# Patient Record
Sex: Male | Born: 1940
Health system: Southern US, Community
[De-identification: ages and names within clinical notes are randomized; demographics above are authoritative.]

## PROBLEM LIST (undated history)

## (undated) DIAGNOSIS — I739 Peripheral vascular disease, unspecified: Secondary | ICD-10-CM

## (undated) DIAGNOSIS — I10 Essential (primary) hypertension: Secondary | ICD-10-CM

## (undated) DIAGNOSIS — I2510 Atherosclerotic heart disease of native coronary artery without angina pectoris: Secondary | ICD-10-CM

## (undated) DIAGNOSIS — E119 Type 2 diabetes mellitus without complications: Secondary | ICD-10-CM

## (undated) DIAGNOSIS — E7800 Pure hypercholesterolemia, unspecified: Secondary | ICD-10-CM

## (undated) DIAGNOSIS — E785 Hyperlipidemia, unspecified: Secondary | ICD-10-CM

## (undated) HISTORY — DX: Type 2 diabetes mellitus without complications: E11.9

## (undated) HISTORY — DX: Pure hypercholesterolemia, unspecified: E78.00

## (undated) HISTORY — DX: Peripheral vascular disease, unspecified: I73.9

## (undated) HISTORY — DX: Essential (primary) hypertension: I10

## (undated) HISTORY — DX: Hyperlipidemia, unspecified: E78.5

## (undated) HISTORY — DX: Atherosclerotic heart disease of native coronary artery without angina pectoris: I25.10

## (undated) HISTORY — PX: OTHER SURGICAL HISTORY: SHX169

## (undated) HISTORY — PX: EYE SURGERY: SHX253

---

## 2006-04-28 ENCOUNTER — Encounter: Admission: RE | Admit: 2006-04-28 | Discharge: 2006-04-28 | Payer: Self-pay | Admitting: Interventional Cardiology

## 2006-04-30 ENCOUNTER — Inpatient Hospital Stay (HOSPITAL_COMMUNITY): Admission: AD | Admit: 2006-04-30 | Discharge: 2006-05-05 | Payer: Self-pay | Admitting: Interventional Cardiology

## 2006-05-28 ENCOUNTER — Encounter (HOSPITAL_COMMUNITY): Admission: RE | Admit: 2006-05-28 | Discharge: 2006-08-26 | Payer: Self-pay | Admitting: Interventional Cardiology

## 2006-07-28 HISTORY — PX: CORONARY ARTERY BYPASS GRAFT: SHX141

## 2006-08-28 ENCOUNTER — Encounter (HOSPITAL_COMMUNITY): Admission: RE | Admit: 2006-08-28 | Discharge: 2006-11-26 | Payer: Self-pay | Admitting: Interventional Cardiology

## 2012-06-30 ENCOUNTER — Other Ambulatory Visit: Payer: Self-pay | Admitting: Internal Medicine

## 2012-06-30 DIAGNOSIS — Z136 Encounter for screening for cardiovascular disorders: Secondary | ICD-10-CM

## 2012-07-07 ENCOUNTER — Other Ambulatory Visit: Payer: Self-pay

## 2012-07-07 ENCOUNTER — Ambulatory Visit
Admission: RE | Admit: 2012-07-07 | Discharge: 2012-07-07 | Disposition: A | Discharge status: Home / Self Care | Payer: Managed Care, Other (non HMO) | Source: Ambulatory Visit | Attending: Internal Medicine | Admitting: Internal Medicine

## 2012-07-07 DIAGNOSIS — Z136 Encounter for screening for cardiovascular disorders: Secondary | ICD-10-CM

## 2012-09-20 ENCOUNTER — Encounter (INDEPENDENT_AMBULATORY_CARE_PROVIDER_SITE_OTHER): Payer: Managed Care, Other (non HMO) | Admitting: Ophthalmology

## 2012-09-20 DIAGNOSIS — H35059 Retinal neovascularization, unspecified, unspecified eye: Secondary | ICD-10-CM

## 2012-09-20 DIAGNOSIS — H3530 Unspecified macular degeneration: Secondary | ICD-10-CM

## 2012-09-20 DIAGNOSIS — H43819 Vitreous degeneration, unspecified eye: Secondary | ICD-10-CM

## 2012-09-20 DIAGNOSIS — H2510 Age-related nuclear cataract, unspecified eye: Secondary | ICD-10-CM

## 2012-10-04 ENCOUNTER — Ambulatory Visit (INDEPENDENT_AMBULATORY_CARE_PROVIDER_SITE_OTHER): Payer: Managed Care, Other (non HMO) | Admitting: Ophthalmology

## 2012-10-04 DIAGNOSIS — H35059 Retinal neovascularization, unspecified, unspecified eye: Secondary | ICD-10-CM

## 2012-11-17 ENCOUNTER — Encounter (INDEPENDENT_AMBULATORY_CARE_PROVIDER_SITE_OTHER): Payer: Managed Care, Other (non HMO) | Admitting: Ophthalmology

## 2012-11-17 DIAGNOSIS — H43819 Vitreous degeneration, unspecified eye: Secondary | ICD-10-CM

## 2012-11-17 DIAGNOSIS — H35059 Retinal neovascularization, unspecified, unspecified eye: Secondary | ICD-10-CM

## 2012-11-17 DIAGNOSIS — H3530 Unspecified macular degeneration: Secondary | ICD-10-CM

## 2013-02-16 ENCOUNTER — Encounter (INDEPENDENT_AMBULATORY_CARE_PROVIDER_SITE_OTHER): Payer: Managed Care, Other (non HMO) | Admitting: Ophthalmology

## 2013-02-16 DIAGNOSIS — H3530 Unspecified macular degeneration: Secondary | ICD-10-CM

## 2013-02-16 DIAGNOSIS — E11319 Type 2 diabetes mellitus with unspecified diabetic retinopathy without macular edema: Secondary | ICD-10-CM

## 2013-02-16 DIAGNOSIS — E1139 Type 2 diabetes mellitus with other diabetic ophthalmic complication: Secondary | ICD-10-CM

## 2013-02-16 DIAGNOSIS — H2510 Age-related nuclear cataract, unspecified eye: Secondary | ICD-10-CM

## 2013-02-16 DIAGNOSIS — H43819 Vitreous degeneration, unspecified eye: Secondary | ICD-10-CM

## 2013-04-18 ENCOUNTER — Other Ambulatory Visit: Payer: Self-pay | Admitting: Interventional Cardiology

## 2013-04-18 DIAGNOSIS — Z79899 Other long term (current) drug therapy: Secondary | ICD-10-CM

## 2013-04-18 DIAGNOSIS — E7800 Pure hypercholesterolemia, unspecified: Secondary | ICD-10-CM

## 2013-04-28 ENCOUNTER — Other Ambulatory Visit: Payer: Managed Care, Other (non HMO)

## 2013-04-29 ENCOUNTER — Other Ambulatory Visit (INDEPENDENT_AMBULATORY_CARE_PROVIDER_SITE_OTHER): Payer: Managed Care, Other (non HMO)

## 2013-04-29 DIAGNOSIS — E7800 Pure hypercholesterolemia, unspecified: Secondary | ICD-10-CM

## 2013-04-29 DIAGNOSIS — Z79899 Other long term (current) drug therapy: Secondary | ICD-10-CM

## 2013-04-29 LAB — LIPID PANEL
HDL: 48 mg/dL (ref >39.00)
LDL Cholesterol: 51 mg/dL (ref 0–99)
Total CHOL/HDL Ratio: 2
Triglycerides: 100 mg/dL (ref 0.0–149.0)

## 2013-05-02 ENCOUNTER — Telehealth: Payer: Self-pay

## 2013-05-02 NOTE — Telephone Encounter (Signed)
Pt made of aware of labs.pt verbalized understanding

## 2013-05-02 NOTE — Telephone Encounter (Signed)
Message copied by Jarvis Newcomer on Mon May 02, 2013  8:35 AM ------      Message from: Verdis Prime      Created: Sat Apr 30, 2013 11:05 AM       The lipid panel shows target values have been obtained. Great! Needs repeat Lipid and Alt.  in 1 year. ------

## 2013-05-23 ENCOUNTER — Telehealth: Payer: Self-pay | Admitting: Interventional Cardiology

## 2013-05-23 NOTE — Telephone Encounter (Signed)
New Problem  Pt called requesting documentation of lab results.   Pt is listing an E-mail address if it is possible to have them emailed.. Please call   [email or web address]

## 2013-05-23 NOTE — Telephone Encounter (Signed)
pt rqst we email lab results.adv pt i could not email but I will mail him a copy.pt verbalized undestanding.

## 2013-07-01 ENCOUNTER — Telehealth: Payer: Self-pay | Admitting: *Deleted

## 2013-07-01 MED ORDER — ATORVASTATIN CALCIUM 10 MG PO TABS: 10.0000 mg | ORAL_TABLET | Freq: Every day | ORAL | Status: DC

## 2013-07-01 NOTE — Telephone Encounter (Signed)
Patient requests a refill on atorvastatin to be sent to [facility]. He is completely out and would like a call when this is sent in.[phone] Thanks, MI

## 2013-07-01 NOTE — Telephone Encounter (Signed)
Done

## 2013-08-22 ENCOUNTER — Ambulatory Visit (INDEPENDENT_AMBULATORY_CARE_PROVIDER_SITE_OTHER): Payer: Managed Care, Other (non HMO) | Admitting: Ophthalmology

## 2013-08-22 DIAGNOSIS — H43819 Vitreous degeneration, unspecified eye: Secondary | ICD-10-CM

## 2013-08-22 DIAGNOSIS — E1139 Type 2 diabetes mellitus with other diabetic ophthalmic complication: Secondary | ICD-10-CM

## 2013-08-22 DIAGNOSIS — H2510 Age-related nuclear cataract, unspecified eye: Secondary | ICD-10-CM

## 2013-08-22 DIAGNOSIS — E11319 Type 2 diabetes mellitus with unspecified diabetic retinopathy without macular edema: Secondary | ICD-10-CM

## 2013-08-22 DIAGNOSIS — H3530 Unspecified macular degeneration: Secondary | ICD-10-CM

## 2013-08-22 DIAGNOSIS — E1165 Type 2 diabetes mellitus with hyperglycemia: Secondary | ICD-10-CM

## 2013-08-29 ENCOUNTER — Encounter: Payer: Self-pay | Admitting: *Deleted

## 2013-08-29 ENCOUNTER — Encounter: Payer: Self-pay | Admitting: Interventional Cardiology

## 2013-08-29 DIAGNOSIS — E785 Hyperlipidemia, unspecified: Secondary | ICD-10-CM | POA: Insufficient documentation

## 2013-08-29 DIAGNOSIS — I2510 Atherosclerotic heart disease of native coronary artery without angina pectoris: Secondary | ICD-10-CM | POA: Insufficient documentation

## 2013-08-29 DIAGNOSIS — I739 Peripheral vascular disease, unspecified: Secondary | ICD-10-CM | POA: Insufficient documentation

## 2013-08-29 DIAGNOSIS — E119 Type 2 diabetes mellitus without complications: Secondary | ICD-10-CM | POA: Insufficient documentation

## 2013-08-29 DIAGNOSIS — I10 Essential (primary) hypertension: Secondary | ICD-10-CM | POA: Insufficient documentation

## 2013-08-29 DIAGNOSIS — E7800 Pure hypercholesterolemia, unspecified: Secondary | ICD-10-CM | POA: Insufficient documentation

## 2014-01-04 ENCOUNTER — Encounter: Payer: Self-pay | Admitting: Interventional Cardiology

## 2014-02-06 ENCOUNTER — Telehealth: Payer: Self-pay | Admitting: Interventional Cardiology

## 2014-02-06 MED ORDER — METOPROLOL SUCCINATE ER 50 MG PO TB24: 50.0000 mg | ORAL_TABLET | Freq: Every day | ORAL | Status: DC

## 2014-02-06 NOTE — Telephone Encounter (Signed)
New message     Refill metoprolol----[facility]---pt has not been seen at the [address] office

## 2014-02-06 NOTE — Telephone Encounter (Signed)
Metoprolol refilled.

## 2014-02-08 ENCOUNTER — Ambulatory Visit: Payer: Managed Care, Other (non HMO) | Admitting: Interventional Cardiology

## 2014-02-17 ENCOUNTER — Other Ambulatory Visit: Payer: Self-pay

## 2014-02-17 MED ORDER — METOPROLOL SUCCINATE ER 50 MG PO TB24
50.0000 mg | ORAL_TABLET | Freq: Every day | ORAL | Status: DC
Start: 2014-02-17 — End: 2014-06-02

## 2014-02-19 ENCOUNTER — Other Ambulatory Visit: Payer: Self-pay

## 2014-02-19 MED ORDER — ATORVASTATIN CALCIUM 10 MG PO TABS: 10.0000 mg | ORAL_TABLET | Freq: Every day | ORAL | Status: DC

## 2014-02-20 ENCOUNTER — Other Ambulatory Visit: Payer: Self-pay

## 2014-02-23 ENCOUNTER — Encounter: Payer: Self-pay | Admitting: Interventional Cardiology

## 2014-02-23 ENCOUNTER — Ambulatory Visit (INDEPENDENT_AMBULATORY_CARE_PROVIDER_SITE_OTHER): Payer: Managed Care, Other (non HMO) | Admitting: Interventional Cardiology

## 2014-02-23 VITALS — BP 144/88 | HR 63 | Ht 68.0 in | Wt 184.8 lb

## 2014-02-23 DIAGNOSIS — I739 Peripheral vascular disease, unspecified: Secondary | ICD-10-CM

## 2014-02-23 DIAGNOSIS — I25810 Atherosclerosis of coronary artery bypass graft(s) without angina pectoris: Secondary | ICD-10-CM

## 2014-02-23 DIAGNOSIS — I10 Essential (primary) hypertension: Secondary | ICD-10-CM

## 2014-02-23 DIAGNOSIS — E785 Hyperlipidemia, unspecified: Secondary | ICD-10-CM

## 2014-02-23 NOTE — Patient Instructions (Signed)
Your physician recommends that you continue on your current medications as directed. Please refer to the Current Medication list given to you today.  Your physician wants you to follow-up in: 1 year with Dr.Smith You will receive a reminder letter in the mail two months in advance. If you don't receive a letter, please call our office to schedule the follow-up appointment.  

## 2014-02-23 NOTE — Progress Notes (Signed)
Patient ID: Micheal [name], male   DOB: Nov 09, 1940, 73 y.o.   MRN: [id number]    [address] [place], [place]  [address] Phone: [phone](336) [phone] Fax:  [phone]  Date:  02/23/2014   ID:  Micheal [name], DOB Nov 09, 1940, MRN [id number]  PCP:  No primary provider on file.   ASSESSMENT:  1. Coronary artery disease with bypass grafting in 2008 2. Hypertension, controlled 3. Hyperlipidemia 4. Claudication  PLAN:  1. Same therapy 2. Continue aerobic activity 3. Continue walking to help improve claudication 4. Continue to have [facility]   SUBJECTIVE: Micheal LuoJohn F Munday is a 73 y.o. male was doing well. Just and off the golf course. Micheal [name] 73 today. He has no chest discomfort, dyspnea, palpitations, or other cardiac complaints.   Wt Readings from Last 3 Encounters:  02/23/14 184 lb 12.8 oz (83.825 kg)     Past Medical History  Diagnosis Date  . CAD (coronary artery disease)     with Cabg 2008  . HTN (hypertension)   . Hyperlipidemia   . Diabetes   . Hypercholesteremia   . Claudication     Current Outpatient Prescriptions  Medication Sig Dispense Refill  . atorvastatin (LIPITOR) 10 MG tablet Take 1 tablet (10 mg total) by mouth daily.  90 tablet  2  . Coenzyme Q10 (CO Q-10) 200 MG CAPS Take 1 capsule by mouth daily.      . Cyanocobalamin (VITAMIN B 12 PO) Take 1,000 mg by mouth daily.      . Ferrous Sulfate (IRON) 28 MG TABS Take by mouth.      . metFORMIN (GLUCOPHAGE) 500 MG tablet Take 500 mg by mouth daily with breakfast.      . metoprolol succinate (TOPROL-XL) 50 MG 24 hr tablet Take 1 tablet (50 mg total) by mouth daily. Take with or immediately following a meal.  90 tablet  0  . Multiple Vitamins-Minerals (CENTRUM SILVER PO) Take 1 tablet by mouth daily.       No current facility-administered medications for this visit.    Allergies:   Allergies not on file  Social History:  The patient  reports that he has  quit smoking. He does not have any smokeless tobacco history on file.   ROS:  Please see the history of present illness.   He denies neurological symptoms. Is no peripheral edema orthopnea or PND.   All other systems reviewed and negative.   OBJECTIVE: VS:  BP 144/88  Pulse 63  Ht 5\' 8"  (1.727 m)  Wt 184 lb 12.8 oz (83.825 kg)  BMI 28.11 kg/m2 Well nourished, well developed, in no acute distress, appears his stated age HEENT: normal Neck: JVD flat. Carotid bruit  absent  Cardiac:  normal S1, S2; RRR; no murmur Lungs:  clear to auscultation bilaterally, no wheezing, rhonchi or rales Abd: soft, nontender, no hepatomegaly Ext: Edema absent. Pulses 2+ and symmetric Skin: warm and dry Neuro:  CNs 2-12 intact, no focal abnormalities noted  EKG:  Normal sinus rhythm with nonspecific T-wave abnormality. No change when compared to prior       Signed, Darci NeedleHenry W. B. Smith III, MD 02/23/2014 2:37 PM

## 2014-05-22 ENCOUNTER — Ambulatory Visit (INDEPENDENT_AMBULATORY_CARE_PROVIDER_SITE_OTHER): Payer: Managed Care, Other (non HMO) | Admitting: Ophthalmology

## 2014-05-22 DIAGNOSIS — H3531 Nonexudative age-related macular degeneration: Secondary | ICD-10-CM

## 2014-05-22 DIAGNOSIS — E11319 Type 2 diabetes mellitus with unspecified diabetic retinopathy without macular edema: Secondary | ICD-10-CM

## 2014-05-22 DIAGNOSIS — H43813 Vitreous degeneration, bilateral: Secondary | ICD-10-CM

## 2014-05-22 DIAGNOSIS — H2513 Age-related nuclear cataract, bilateral: Secondary | ICD-10-CM

## 2014-05-22 DIAGNOSIS — E11329 Type 2 diabetes mellitus with mild nonproliferative diabetic retinopathy without macular edema: Secondary | ICD-10-CM

## 2014-06-02 ENCOUNTER — Other Ambulatory Visit: Payer: Self-pay | Admitting: Interventional Cardiology

## 2014-06-02 MED ORDER — METOPROLOL SUCCINATE ER 50 MG PO TB24: 50.0000 mg | ORAL_TABLET | Freq: Every day | ORAL | Status: DC

## 2014-06-07 ENCOUNTER — Telehealth: Payer: Self-pay | Admitting: Interventional Cardiology

## 2014-06-07 DIAGNOSIS — E785 Hyperlipidemia, unspecified: Secondary | ICD-10-CM

## 2014-06-07 NOTE — Telephone Encounter (Signed)
Per recall pt needs appointment for lipids, alt. Last lipid profile Oct 2014. Pt made appointment with scheduler. Orders placed for lipids, alt, linked orders.

## 2014-06-07 NOTE — Telephone Encounter (Signed)
New problem   Pt has a recall in for labs, but no order.

## 2014-06-19 ENCOUNTER — Other Ambulatory Visit: Payer: Managed Care, Other (non HMO)

## 2014-06-19 ENCOUNTER — Other Ambulatory Visit (INDEPENDENT_AMBULATORY_CARE_PROVIDER_SITE_OTHER): Payer: Managed Care, Other (non HMO) | Admitting: *Deleted

## 2014-06-19 DIAGNOSIS — E785 Hyperlipidemia, unspecified: Secondary | ICD-10-CM

## 2014-06-19 LAB — ALT: ALT: 21 U/L (ref 0–53)

## 2014-06-19 LAB — LIPID PANEL
CHOLESTEROL: 118 mg/dL (ref 0–200)
HDL: 44.8 mg/dL (ref >39.00)
LDL CALC: 61 mg/dL (ref 0–99)
NonHDL: 73.2
TRIGLYCERIDES: 62 mg/dL (ref 0.0–149.0)
Total CHOL/HDL Ratio: 3
VLDL: 12.4 mg/dL (ref 0.0–40.0)

## 2014-06-20 ENCOUNTER — Telehealth: Payer: Self-pay

## 2014-06-20 DIAGNOSIS — E785 Hyperlipidemia, unspecified: Secondary | ICD-10-CM

## 2014-06-20 NOTE — Telephone Encounter (Signed)
Pt aware of lab results with verbal understanding..Lipids are excellent. Recheck 1 year

## 2014-06-20 NOTE — Telephone Encounter (Signed)
-----   Message from Henry W Smith III, MD sent at 06/19/2014  5:42 PM EST ----- Lipids are excellent. Recheck 1 year 

## 2014-06-20 NOTE — Telephone Encounter (Signed)
called to give pt lab results.lmtcb 

## 2014-06-20 NOTE — Telephone Encounter (Signed)
-----   Message from Lesleigh NoeHenry W Smith III, MD sent at 06/19/2014  5:42 PM EST ----- Lipids are excellent. Recheck 1 year

## 2014-08-25 ENCOUNTER — Other Ambulatory Visit: Payer: Self-pay | Admitting: Interventional Cardiology

## 2014-11-21 ENCOUNTER — Other Ambulatory Visit: Payer: Self-pay | Admitting: Interventional Cardiology

## 2014-11-27 ENCOUNTER — Ambulatory Visit (INDEPENDENT_AMBULATORY_CARE_PROVIDER_SITE_OTHER): Payer: Managed Care, Other (non HMO) | Admitting: Ophthalmology

## 2014-11-27 DIAGNOSIS — H43813 Vitreous degeneration, bilateral: Secondary | ICD-10-CM

## 2014-11-27 DIAGNOSIS — H2513 Age-related nuclear cataract, bilateral: Secondary | ICD-10-CM

## 2014-11-27 DIAGNOSIS — H3531 Nonexudative age-related macular degeneration: Secondary | ICD-10-CM | POA: Diagnosis not present

## 2014-12-14 DIAGNOSIS — H4011X1 Primary open-angle glaucoma, mild stage: Secondary | ICD-10-CM | POA: Diagnosis not present

## 2015-01-01 ENCOUNTER — Telehealth: Payer: Self-pay | Admitting: Interventional Cardiology

## 2015-01-01 NOTE — Telephone Encounter (Signed)
New Message        Pt wants to know if he needs to have fasting labs done at his appt on 04/12/15. Please call back and advise.

## 2015-01-03 NOTE — Telephone Encounter (Signed)
Returned pt call. Pt we have fasting lipid and alt prior to his o/v with Dr.Smith on 04/12/15. Lab appt scheduled on 04/12/15 for fasting labs

## 2015-01-08 ENCOUNTER — Encounter (INDEPENDENT_AMBULATORY_CARE_PROVIDER_SITE_OTHER): Payer: Managed Care, Other (non HMO) | Admitting: Ophthalmology

## 2015-01-08 DIAGNOSIS — H3531 Nonexudative age-related macular degeneration: Secondary | ICD-10-CM

## 2015-01-08 DIAGNOSIS — H43813 Vitreous degeneration, bilateral: Secondary | ICD-10-CM

## 2015-01-08 DIAGNOSIS — H2513 Age-related nuclear cataract, bilateral: Secondary | ICD-10-CM

## 2015-01-08 DIAGNOSIS — H318 Other specified disorders of choroid: Secondary | ICD-10-CM

## 2015-01-15 ENCOUNTER — Ambulatory Visit (INDEPENDENT_AMBULATORY_CARE_PROVIDER_SITE_OTHER): Payer: Managed Care, Other (non HMO) | Admitting: Ophthalmology

## 2015-01-15 DIAGNOSIS — H3531 Nonexudative age-related macular degeneration: Secondary | ICD-10-CM

## 2015-01-15 DIAGNOSIS — H318 Other specified disorders of choroid: Secondary | ICD-10-CM | POA: Diagnosis not present

## 2015-02-12 ENCOUNTER — Encounter (INDEPENDENT_AMBULATORY_CARE_PROVIDER_SITE_OTHER): Payer: Managed Care, Other (non HMO) | Admitting: Ophthalmology

## 2015-02-12 DIAGNOSIS — H318 Other specified disorders of choroid: Secondary | ICD-10-CM

## 2015-02-12 DIAGNOSIS — H3531 Nonexudative age-related macular degeneration: Secondary | ICD-10-CM | POA: Diagnosis not present

## 2015-04-03 ENCOUNTER — Other Ambulatory Visit: Payer: Self-pay | Admitting: Interventional Cardiology

## 2015-04-11 ENCOUNTER — Other Ambulatory Visit (INDEPENDENT_AMBULATORY_CARE_PROVIDER_SITE_OTHER): Payer: Managed Care, Other (non HMO) | Admitting: *Deleted

## 2015-04-11 DIAGNOSIS — E785 Hyperlipidemia, unspecified: Secondary | ICD-10-CM

## 2015-04-11 LAB — LIPID PANEL
CHOLESTEROL: 112 mg/dL (ref 0–200)
HDL: 47.1 mg/dL (ref >39.00)
LDL Cholesterol: 48 mg/dL (ref 0–99)
NONHDL: 65.27
Total CHOL/HDL Ratio: 2
Triglycerides: 86 mg/dL (ref 0.0–149.0)
VLDL: 17.2 mg/dL (ref 0.0–40.0)

## 2015-04-11 LAB — ALT: ALT: 17 U/L (ref 0–53)

## 2015-04-12 ENCOUNTER — Encounter: Payer: Self-pay | Admitting: Interventional Cardiology

## 2015-04-12 ENCOUNTER — Ambulatory Visit (INDEPENDENT_AMBULATORY_CARE_PROVIDER_SITE_OTHER): Payer: Managed Care, Other (non HMO) | Admitting: Interventional Cardiology

## 2015-04-12 VITALS — BP 140/80 | HR 59 | Ht 69.0 in | Wt 183.0 lb

## 2015-04-12 DIAGNOSIS — E785 Hyperlipidemia, unspecified: Secondary | ICD-10-CM

## 2015-04-12 DIAGNOSIS — I10 Essential (primary) hypertension: Secondary | ICD-10-CM | POA: Diagnosis not present

## 2015-04-12 DIAGNOSIS — I739 Peripheral vascular disease, unspecified: Secondary | ICD-10-CM | POA: Diagnosis not present

## 2015-04-12 DIAGNOSIS — I25810 Atherosclerosis of coronary artery bypass graft(s) without angina pectoris: Secondary | ICD-10-CM | POA: Diagnosis not present

## 2015-04-12 MED ORDER — ATORVASTATIN CALCIUM 10 MG PO TABS: 5.0000 mg | ORAL_TABLET | Freq: Every day | ORAL | Status: DC

## 2015-04-12 NOTE — Patient Instructions (Signed)
Medication Instructions:  Your physician recommends that you continue on your current medications as directed. Please refer to the Current Medication list given to you today.   Labwork: None ordered  Testing/Procedures: None ordered  Follow-Up: Your physician wants you to follow-up in: 1 year with Dr.Smith You will receive a reminder letter in the mail two months in advance. If you don't receive a letter, please call our office to schedule the follow-up appointment.   Any Other Special Instructions Will Be Listed Below (If Applicable).   

## 2015-04-12 NOTE — Progress Notes (Signed)
Cardiology Office Note   Date:  04/12/2015   ID:  Micheal [name], DOB 1940-11-05, MRN [id number]  PCP:  Tommy Rainwater, MD  Cardiologist:  Lesleigh Noe, MD   Chief Complaint  Patient presents with  . Coronary Artery Disease      History of Present Illness: Micheal [name] is a 74 y.o. male who presents for CAD, hyperlipidemia, hypertension, and exertional lower extremity discomfort. He states he has had a lower extremity Doppler study performed at the [facility] and was told that things were normal.  The patient has had increasing difficulty with exertional leg fatigue. He stopped taking Lipitor 10 mg daily, and the discomfort eventually went away. He has not by color 5 mg per day for the last 6 months in is doing well. He has no limitations related to leg fatigue or discomfort.  He denies chest discomfort. He has not had syncope. No medication side effects.    Past Medical History  Diagnosis Date  . CAD (coronary artery disease)     with Cabg 2008  . HTN (hypertension)   . Hyperlipidemia   . Diabetes   . Hypercholesteremia   . Claudication     Past Surgical History  Procedure Laterality Date  . Coronary bypass surgery      2008     Current Outpatient Prescriptions  Medication Sig Dispense Refill  . atorvastatin (LIPITOR) 10 MG tablet Take 0.5 tablets (5 mg total) by mouth daily.    . Coenzyme Q10 (CO Q-10) 200 MG CAPS Take 1 capsule by mouth daily.    . Cyanocobalamin (VITAMIN B 12 PO) Take 1,000 mg by mouth daily.    . Ferrous Sulfate (IRON) 28 MG TABS Take by mouth.    . metFORMIN (GLUCOPHAGE) 1000 MG tablet Take one (1) tablet (1,000 mg total) by mouth every morning and take half (1/2) tablet (500 mg total) by mouth every evening.    . metoprolol succinate (TOPROL-XL) 50 MG 24 hr tablet Take 1 tablet (50 mg total) by mouth daily. 30 tablet 0  . SIMBRINZA 1-0.2 % SUSP Place 1 drop into both eyes 2 (two) times daily.     No current  facility-administered medications for this visit.    Allergies:   Review of patient's allergies indicates no known allergies.    Social History:  The patient  reports that he has quit smoking. He has never used smokeless tobacco. He reports that he drinks about 0.6 oz of alcohol per week. He reports that he does not use illicit drugs.   Family History:  The patient's family history includes Healthy in his sister, sister, and sister; Heart attack in his maternal grandfather; Heart disease in his father.    ROS:  Please see the history of present illness.   Otherwise, review of systems are positive for leg fatigue and possible musculoskeletal syndrome from Lipitor..   All other systems are reviewed and negative.    PHYSICAL EXAM: VS:  BP 162/76 mmHg  Pulse 59  Ht  (1.753 m)  Wt 83.008 kg (183 lb)  BMI 27.01 kg/m2 , BMI Body mass index is 27.01 kg/(m^2). GEN: Well nourished, well developed, in no acute distress HEENT: normal Neck: no JVD, carotid bruits, or masses Cardiac: RRR.  There is no murmur, rub, or gallop. There is no edema. Respiratory:  clear to auscultation bilaterally, normal work of breathing. GI: soft, nontender, nondistended, + BS MS: no deformity or atrophy Skin: warm  and dry, no rash Neuro:  Strength and sensation are intact Psych: euthymic mood, full affect   EKG:  EKG is ordered today. The ekg reveals normal sinus rhythm/sinus bradycardia with nonspecific T-wave flattening. No change from EKG done one year ago.   Recent Labs: 04/11/2015: ALT 17    Lipid Panel    Component Value Date/Time   CHOL 112 04/11/2015 0801   TRIG 86.0 04/11/2015 0801   HDL 47.10 04/11/2015 0801   CHOLHDL 2 04/11/2015 0801   VLDL 17.2 04/11/2015 0801   LDLCALC 48 04/11/2015 0801      Wt Readings from Last 3 Encounters:  04/12/15 83.008 kg (183 lb)  02/23/14 83.825 kg (184 lb 12.8 oz)      Other studies Reviewed: Additional studies/ records that were reviewed today  include: None. We will review the results of his recent lipid profile..    ASSESSMENT AND PLAN:  1. Coronary artery disease involving coronary bypass graft of native heart without angina pectoris Asymptomatic  2. Essential hypertension Controlled  3. Hyperlipidemia Total cholesterol is less than 130. LDL is less than 70.  4. Claudication Still has intermittent difficulty. He has had this evaluated at the [facility] and was told that his Doppler studies were normal. He stopped taking statin therapy for a while and states that all the discomfort on exertion when away. He decreased the dose by 50% and says that he now has no difficulty. We will continue to monitor this.    Current medicines are reviewed at length with the patient today.  The patient has the following concerns regarding medicines: He is now taking Lipitor 5 mg per day not 10 mg per day.  The following changes/actions have been instituted:    Notify me if the discomfort in the legs increases with exertion  Consider doing on bilateral lower extremity arterial Doppler study rather than depending upon the [place] results by word of mouth  Low-salt diet  Weight loss  Monitor blood pressure at home  Call if clinical problems. Caution not to stop his statin therapy without notifying us.  Labs/ tests ordered today include:   Orders Placed This Encounter  Procedures  . EKG 12-Lead     Disposition:   FU with HS in 1 year  Signed, Lesleigh Noe, MD  04/12/2015 3:32 PM    [facility] [address] [place], [place], [place]  [address] Phone: [phone]; Fax: [phone]

## 2015-04-13 ENCOUNTER — Telehealth: Payer: Self-pay

## 2015-04-13 DIAGNOSIS — E785 Hyperlipidemia, unspecified: Secondary | ICD-10-CM

## 2015-04-13 NOTE — Telephone Encounter (Signed)
Pt aware of lab results with verbal understanding. 

## 2015-04-13 NOTE — Telephone Encounter (Signed)
-----   Message from Lyn Records, MD sent at 04/11/2015  4:41 PM EDT ----- Stable and at target. Recheck in 1 year,

## 2015-05-15 ENCOUNTER — Encounter (INDEPENDENT_AMBULATORY_CARE_PROVIDER_SITE_OTHER): Payer: Managed Care, Other (non HMO) | Admitting: Ophthalmology

## 2015-05-15 DIAGNOSIS — H353122 Nonexudative age-related macular degeneration, left eye, intermediate dry stage: Secondary | ICD-10-CM | POA: Diagnosis not present

## 2015-05-15 DIAGNOSIS — H43813 Vitreous degeneration, bilateral: Secondary | ICD-10-CM

## 2015-05-15 DIAGNOSIS — H318 Other specified disorders of choroid: Secondary | ICD-10-CM | POA: Diagnosis not present

## 2015-05-15 DIAGNOSIS — H353211 Exudative age-related macular degeneration, right eye, with active choroidal neovascularization: Secondary | ICD-10-CM | POA: Diagnosis not present

## 2015-05-17 ENCOUNTER — Other Ambulatory Visit: Payer: Self-pay

## 2015-05-17 MED ORDER — METOPROLOL SUCCINATE ER 50 MG PO TB24: 50.0000 mg | ORAL_TABLET | Freq: Every day | ORAL | Status: DC

## 2015-05-30 ENCOUNTER — Ambulatory Visit (INDEPENDENT_AMBULATORY_CARE_PROVIDER_SITE_OTHER): Payer: Managed Care, Other (non HMO) | Admitting: Ophthalmology

## 2015-07-02 ENCOUNTER — Other Ambulatory Visit: Payer: Self-pay | Admitting: Interventional Cardiology

## 2015-07-09 DIAGNOSIS — H2512 Age-related nuclear cataract, left eye: Secondary | ICD-10-CM | POA: Diagnosis not present

## 2015-09-04 ENCOUNTER — Other Ambulatory Visit: Payer: Self-pay | Admitting: Interventional Cardiology

## 2015-09-04 MED ORDER — METOPROLOL SUCCINATE ER 50 MG PO TB24: 50.0000 mg | ORAL_TABLET | Freq: Every day | ORAL | Status: DC

## 2015-09-04 MED ORDER — ATORVASTATIN CALCIUM 10 MG PO TABS: 5.0000 mg | ORAL_TABLET | Freq: Every day | ORAL | Status: DC

## 2015-11-13 ENCOUNTER — Ambulatory Visit (INDEPENDENT_AMBULATORY_CARE_PROVIDER_SITE_OTHER): Payer: Managed Care, Other (non HMO) | Admitting: Ophthalmology

## 2015-11-13 DIAGNOSIS — H318 Other specified disorders of choroid: Secondary | ICD-10-CM | POA: Diagnosis not present

## 2015-11-13 DIAGNOSIS — H353122 Nonexudative age-related macular degeneration, left eye, intermediate dry stage: Secondary | ICD-10-CM | POA: Diagnosis not present

## 2015-11-13 DIAGNOSIS — H43813 Vitreous degeneration, bilateral: Secondary | ICD-10-CM | POA: Diagnosis not present

## 2016-04-15 DIAGNOSIS — Z23 Encounter for immunization: Secondary | ICD-10-CM | POA: Diagnosis not present

## 2016-04-30 ENCOUNTER — Other Ambulatory Visit: Payer: Medicare Other | Admitting: *Deleted

## 2016-04-30 DIAGNOSIS — I10 Essential (primary) hypertension: Secondary | ICD-10-CM | POA: Diagnosis not present

## 2016-04-30 DIAGNOSIS — E7800 Pure hypercholesterolemia, unspecified: Secondary | ICD-10-CM | POA: Diagnosis not present

## 2016-04-30 DIAGNOSIS — I2510 Atherosclerotic heart disease of native coronary artery without angina pectoris: Secondary | ICD-10-CM

## 2016-04-30 LAB — ALT: ALT: 14 U/L (ref 9–46)

## 2016-04-30 LAB — LIPID PANEL
Cholesterol: 131 mg/dL (ref 125–200)
HDL: 38 mg/dL — ABNORMAL LOW (ref >=40)
LDL Cholesterol: 61 mg/dL (ref <130)
Total CHOL/HDL Ratio: 3.4 Ratio (ref <=5.0)
Triglycerides: 160 mg/dL — ABNORMAL HIGH (ref <150)
VLDL: 32 mg/dL — ABNORMAL HIGH (ref <30)

## 2016-04-30 NOTE — Addendum Note (Signed)
Addended by: Tonita PhoenixBOWDEN, ROBIN K on: 04/30/2016 09:28 AM   Modules accepted: Orders

## 2016-05-01 ENCOUNTER — Telehealth: Payer: Self-pay | Admitting: Interventional Cardiology

## 2016-05-01 NOTE — Telephone Encounter (Signed)
New message    Patient calling stating the nurse just called him - returning call back

## 2016-05-01 NOTE — Telephone Encounter (Signed)
Informed pt of lab results. Pt verbalized understanding. 

## 2016-05-14 ENCOUNTER — Ambulatory Visit (INDEPENDENT_AMBULATORY_CARE_PROVIDER_SITE_OTHER): Payer: Medicare Other | Admitting: Ophthalmology

## 2016-05-14 DIAGNOSIS — H43813 Vitreous degeneration, bilateral: Secondary | ICD-10-CM | POA: Diagnosis not present

## 2016-05-14 DIAGNOSIS — H353122 Nonexudative age-related macular degeneration, left eye, intermediate dry stage: Secondary | ICD-10-CM | POA: Diagnosis not present

## 2016-05-14 DIAGNOSIS — H318 Other specified disorders of choroid: Secondary | ICD-10-CM

## 2016-06-02 ENCOUNTER — Ambulatory Visit (INDEPENDENT_AMBULATORY_CARE_PROVIDER_SITE_OTHER): Payer: Medicare Other | Admitting: Nurse Practitioner

## 2016-06-02 ENCOUNTER — Encounter: Payer: Self-pay | Admitting: Nurse Practitioner

## 2016-06-02 VITALS — BP 138/72 | HR 58 | Ht 68.0 in | Wt 181.8 lb

## 2016-06-02 DIAGNOSIS — E7800 Pure hypercholesterolemia, unspecified: Secondary | ICD-10-CM | POA: Diagnosis not present

## 2016-06-02 DIAGNOSIS — I25810 Atherosclerosis of coronary artery bypass graft(s) without angina pectoris: Secondary | ICD-10-CM

## 2016-06-02 DIAGNOSIS — E088 Diabetes mellitus due to underlying condition with unspecified complications: Secondary | ICD-10-CM | POA: Diagnosis not present

## 2016-06-02 DIAGNOSIS — I10 Essential (primary) hypertension: Secondary | ICD-10-CM | POA: Diagnosis not present

## 2016-06-02 NOTE — Progress Notes (Signed)
CARDIOLOGY OFFICE NOTE  Date:  06/02/2016    Micheal [name] Date of Birth: [birth year] Medical Record #[id number]  PCP:  Lorenda Ishiharaupashree Varadarajan, MD  Cardiologist:  Kyra MangesGerhardt & Smith    Chief Complaint  Patient presents with  . Coronary Artery Disease    Follow up visit - seen for Dr. [name]    History of Present Illness: Micheal LuoJohn F Hommes is a 75 y.o. male who presents today for a follow up visit. Seen for Dr. [name].   He has a history of CAD with prior CABG back in 2008, DM, hyperlipidemia, hypertension, and exertional lower extremity discomfort. He states he has had a prior lower extremity Doppler [facility] and was told that things were normal.  Last seen back in September of 2016 by Dr. [name] - complaining of leg fatigue - had stopped his statin with improvement and was able to be restarted at a lower dose without side effects.  Comes in today. Here alone. He is doing well. No chest pain. Not short of breath. His legs are better since he has been on the lower dose of statin. Golfs 3 times a week. He is happy with how he is doing.   Past Medical History:  Diagnosis Date  . CAD (coronary artery disease)    with Cabg 2008  . Claudication (HCC)   . Diabetes (HCC)   . HTN (hypertension)   . Hypercholesteremia   . Hyperlipidemia     Past Surgical History:  Procedure Laterality Date  . Coronary bypass surgery     2008     Medications: Current Outpatient Prescriptions  Medication Sig Dispense Refill  . atorvastatin (LIPITOR) 10 MG tablet Take 0.5 tablets (5 mg total) by mouth daily. 45 tablet 3  . Coenzyme Q10 (CO Q-10) 200 MG CAPS Take 1 capsule by mouth daily.    . Cyanocobalamin (VITAMIN B 12 PO) Take 1,000 mg by mouth daily.    . Ferrous Sulfate (IRON) 28 MG TABS Take by mouth.    . losartan (COZAAR) 50 MG tablet Take 50 mg by mouth daily.  2  . metFORMIN (GLUCOPHAGE) 1000 MG tablet Take one (1) tablet (1,000 mg total) by mouth every  morning and take half (1/2) tablet (500 mg total) by mouth every evening.    . metoprolol succinate (TOPROL-XL) 50 MG 24 hr tablet Take 1 tablet (50 mg total) by mouth daily. 90 tablet 3  . SIMBRINZA 1-0.2 % SUSP Place 1 drop into both eyes 2 (two) times daily.     No current facility-administered medications for this visit.     Allergies: No Known Allergies  Social History: The patient  reports that he has quit smoking. He has never used smokeless tobacco. He reports that he drinks about 0.6 oz of alcohol per week . He reports that he does not use drugs.   Family History: The patient's family history includes Healthy in his sister, sister, and sister; Heart attack in his maternal grandfather; Heart disease in his father.   Review of Systems: Please see the history of present illness.   Otherwise, the review of systems is positive for none.   All other systems are reviewed and negative.   Physical Exam: VS:  BP 138/72   Pulse (!) 58   Ht 5\' 8"  (1.727 m)   Wt 181 lb 12.8 oz (82.5 kg)   BMI 27.64 kg/m  .  BMI Body mass index is 27.64 kg/m.  Wt Readings from Last 3 Encounters:  06/02/16 181 lb 12.8 oz (82.5 kg)  04/12/15 183 lb (83 kg)  02/23/14 184 lb 12.8 oz (83.8 kg)    General: Pleasant. Well developed, well nourished and in no acute distress.   HEENT: Normal.  Neck: Supple, no JVD, carotid bruits, or masses noted.  Cardiac: Regular rate and rhythm. No murmurs, rubs, or gallops. No edema.  Respiratory:  Lungs are clear to auscultation bilaterally with normal work of breathing.  GI: Soft and nontender.  MS: No deformity or atrophy. Gait and ROM intact.  Skin: Warm and dry. Color is normal.  Neuro:  Strength and sensation are intact and no gross focal deficits noted.  Psych: Alert, appropriate and with normal affect.   LABORATORY DATA:  EKG:  EKG is ordered today. This demonstrates sinus bradycardia with non specific ST/T wave changes - unchanged.  Lab Results    Component Value Date   CHOL 131 04/30/2016   TRIG 160 (H) 04/30/2016   HDL 38 (L) 04/30/2016   LDLCALC 61 04/30/2016   ALT 14 04/30/2016    BNP (last 3 results) No results for input(s): BNP in the last 8760 hours.  ProBNP (last 3 results) No results for input(s): PROBNP in the last 8760 hours.   Other Studies Reviewed Today:   Assessment/Plan:  1. Coronary artery disease with remote CABG - continues to do well. No symptoms. Continue with CV risk factor modification.   2. Essential hypertension - BP ok on current regimen. Needs BMET.   3. Hyperlipidemia - recent labs noted. He is able to tolerate the current dose of his statin.   Current medicines are reviewed with the patient today.  The patient does not have concerns regarding medicines other than what has been noted above.  The following changes have been made:  See above.  Labs/ tests ordered today include:    Orders Placed This Encounter  Procedures  . Basic metabolic panel  . Hemoglobin A1c  . EKG 12-Lead     Disposition:   FU with Dr. [name] in one year.   Patient is agreeable to this plan and will call if any problems develop in the interim.   Signed: Rosalio MacadamiaLori C. Roshan Salamon, RN, ANP-C 06/02/2016 3:09 PM  [facility] [address] [place], [place]  [address] Phone: [phone](336) [phone] Fax: [phone]) [phone]

## 2016-06-02 NOTE — Patient Instructions (Addendum)
We will be checking the following labs tomorrow - Fasting BMET and A1C   Medication Instructions:    Continue with your current medicines.     Testing/Procedures To Be Arranged:  N/A  Follow-Up:   See Dr. [name] in one year.     Other Special Instructions:   Keep up the good work!    If you need a refill on your cardiac medications before your next appointment, please call your pharmacy.   Call the [facility] office at [phone] if you have any questions, problems or concerns.

## 2016-06-03 ENCOUNTER — Other Ambulatory Visit (INDEPENDENT_AMBULATORY_CARE_PROVIDER_SITE_OTHER): Payer: Medicare Other | Admitting: *Deleted

## 2016-06-03 DIAGNOSIS — E7800 Pure hypercholesterolemia, unspecified: Secondary | ICD-10-CM

## 2016-06-03 DIAGNOSIS — E088 Diabetes mellitus due to underlying condition with unspecified complications: Secondary | ICD-10-CM | POA: Diagnosis not present

## 2016-06-03 DIAGNOSIS — I10 Essential (primary) hypertension: Secondary | ICD-10-CM

## 2016-06-03 DIAGNOSIS — I25810 Atherosclerosis of coronary artery bypass graft(s) without angina pectoris: Secondary | ICD-10-CM

## 2016-06-03 LAB — BASIC METABOLIC PANEL
BUN: 16 mg/dL (ref 7–25)
CO2: 28 mmol/L (ref 20–31)
Calcium: 9.4 mg/dL (ref 8.6–10.3)
Chloride: 104 mmol/L (ref 98–110)
Creat: 0.97 mg/dL (ref 0.70–1.18)
Glucose, Bld: 155 mg/dL — ABNORMAL HIGH (ref 65–99)
Potassium: 5.4 mmol/L — ABNORMAL HIGH (ref 3.5–5.3)
Sodium: 140 mmol/L (ref 135–146)

## 2016-06-04 LAB — HEMOGLOBIN A1C
Hgb A1c MFr Bld: 7.1 % — ABNORMAL HIGH (ref <5.7)
Mean Plasma Glucose: 157 mg/dL

## 2016-07-15 DIAGNOSIS — E119 Type 2 diabetes mellitus without complications: Secondary | ICD-10-CM | POA: Diagnosis not present

## 2016-07-15 DIAGNOSIS — I10 Essential (primary) hypertension: Secondary | ICD-10-CM | POA: Diagnosis not present

## 2016-07-15 DIAGNOSIS — Z0000 Encounter for general adult medical examination without abnormal findings: Secondary | ICD-10-CM | POA: Diagnosis not present

## 2016-07-15 DIAGNOSIS — Z1389 Encounter for screening for other disorder: Secondary | ICD-10-CM | POA: Diagnosis not present

## 2016-07-15 DIAGNOSIS — E785 Hyperlipidemia, unspecified: Secondary | ICD-10-CM | POA: Diagnosis not present

## 2016-07-15 DIAGNOSIS — Z125 Encounter for screening for malignant neoplasm of prostate: Secondary | ICD-10-CM | POA: Diagnosis not present

## 2016-07-15 DIAGNOSIS — Z7984 Long term (current) use of oral hypoglycemic drugs: Secondary | ICD-10-CM | POA: Diagnosis not present

## 2016-08-14 ENCOUNTER — Encounter (INDEPENDENT_AMBULATORY_CARE_PROVIDER_SITE_OTHER): Payer: Medicare Other | Admitting: Ophthalmology

## 2016-08-29 ENCOUNTER — Encounter (INDEPENDENT_AMBULATORY_CARE_PROVIDER_SITE_OTHER): Payer: Medicare Other | Admitting: Ophthalmology

## 2016-08-29 DIAGNOSIS — H26493 Other secondary cataract, bilateral: Secondary | ICD-10-CM

## 2016-08-29 DIAGNOSIS — H353121 Nonexudative age-related macular degeneration, left eye, early dry stage: Secondary | ICD-10-CM

## 2016-08-29 DIAGNOSIS — H318 Other specified disorders of choroid: Secondary | ICD-10-CM

## 2016-08-29 DIAGNOSIS — H43813 Vitreous degeneration, bilateral: Secondary | ICD-10-CM | POA: Diagnosis not present

## 2016-09-26 ENCOUNTER — Other Ambulatory Visit (INDEPENDENT_AMBULATORY_CARE_PROVIDER_SITE_OTHER): Payer: Medicare Other | Admitting: Ophthalmology

## 2016-09-26 DIAGNOSIS — H2701 Aphakia, right eye: Secondary | ICD-10-CM

## 2016-11-18 ENCOUNTER — Other Ambulatory Visit: Payer: Self-pay | Admitting: Interventional Cardiology

## 2016-12-25 DIAGNOSIS — E119 Type 2 diabetes mellitus without complications: Secondary | ICD-10-CM | POA: Diagnosis not present

## 2017-01-08 ENCOUNTER — Encounter (INDEPENDENT_AMBULATORY_CARE_PROVIDER_SITE_OTHER): Payer: Medicare Other | Admitting: Ophthalmology

## 2017-01-08 DIAGNOSIS — H353121 Nonexudative age-related macular degeneration, left eye, early dry stage: Secondary | ICD-10-CM

## 2017-01-08 DIAGNOSIS — H353211 Exudative age-related macular degeneration, right eye, with active choroidal neovascularization: Secondary | ICD-10-CM

## 2017-01-08 DIAGNOSIS — H43813 Vitreous degeneration, bilateral: Secondary | ICD-10-CM

## 2017-01-14 DIAGNOSIS — E119 Type 2 diabetes mellitus without complications: Secondary | ICD-10-CM | POA: Diagnosis not present

## 2017-01-14 DIAGNOSIS — Z7984 Long term (current) use of oral hypoglycemic drugs: Secondary | ICD-10-CM | POA: Diagnosis not present

## 2017-01-14 DIAGNOSIS — I10 Essential (primary) hypertension: Secondary | ICD-10-CM | POA: Diagnosis not present

## 2017-01-14 DIAGNOSIS — E1165 Type 2 diabetes mellitus with hyperglycemia: Secondary | ICD-10-CM | POA: Diagnosis not present

## 2017-01-14 DIAGNOSIS — E785 Hyperlipidemia, unspecified: Secondary | ICD-10-CM | POA: Diagnosis not present

## 2017-01-14 DIAGNOSIS — R252 Cramp and spasm: Secondary | ICD-10-CM | POA: Diagnosis not present

## 2017-01-15 ENCOUNTER — Encounter (INDEPENDENT_AMBULATORY_CARE_PROVIDER_SITE_OTHER): Payer: Medicare Other | Admitting: Ophthalmology

## 2017-01-15 DIAGNOSIS — H318 Other specified disorders of choroid: Secondary | ICD-10-CM

## 2017-01-15 DIAGNOSIS — H353121 Nonexudative age-related macular degeneration, left eye, early dry stage: Secondary | ICD-10-CM | POA: Diagnosis not present

## 2017-01-30 DIAGNOSIS — H401131 Primary open-angle glaucoma, bilateral, mild stage: Secondary | ICD-10-CM | POA: Diagnosis not present

## 2017-02-12 ENCOUNTER — Encounter (INDEPENDENT_AMBULATORY_CARE_PROVIDER_SITE_OTHER): Payer: Medicare Other | Admitting: Ophthalmology

## 2017-03-11 ENCOUNTER — Encounter (INDEPENDENT_AMBULATORY_CARE_PROVIDER_SITE_OTHER): Payer: Medicare Other | Admitting: Ophthalmology

## 2017-03-20 ENCOUNTER — Encounter (INDEPENDENT_AMBULATORY_CARE_PROVIDER_SITE_OTHER): Payer: Medicare Other | Admitting: Ophthalmology

## 2017-03-20 DIAGNOSIS — H353121 Nonexudative age-related macular degeneration, left eye, early dry stage: Secondary | ICD-10-CM

## 2017-03-20 DIAGNOSIS — H318 Other specified disorders of choroid: Secondary | ICD-10-CM

## 2017-05-08 ENCOUNTER — Other Ambulatory Visit: Payer: Self-pay | Admitting: Interventional Cardiology

## 2017-05-10 DIAGNOSIS — Z23 Encounter for immunization: Secondary | ICD-10-CM | POA: Diagnosis not present

## 2017-06-02 ENCOUNTER — Other Ambulatory Visit: Payer: Self-pay

## 2017-06-02 MED ORDER — METOPROLOL SUCCINATE ER 50 MG PO TB24: 50.0000 mg | ORAL_TABLET | Freq: Every day | ORAL | 1 refills | Status: DC

## 2017-06-02 MED ORDER — METOPROLOL SUCCINATE ER 50 MG PO TB24: 50.0000 mg | ORAL_TABLET | Freq: Every day | ORAL | 0 refills | Status: DC

## 2017-06-02 NOTE — Addendum Note (Signed)
Addended by: Valrie HartISLEY, Abigaelle Verley L on: 06/02/2017 03:31 PM   Modules accepted: Orders

## 2017-06-02 NOTE — Telephone Encounter (Signed)
Received a fax from [facility] requesting to change the metoprolol rx that was sent in today to a ninety day supply. I will update and resend.

## 2017-06-05 DIAGNOSIS — Z23 Encounter for immunization: Secondary | ICD-10-CM | POA: Diagnosis not present

## 2017-06-22 ENCOUNTER — Encounter (INDEPENDENT_AMBULATORY_CARE_PROVIDER_SITE_OTHER): Payer: Medicare Other | Admitting: Ophthalmology

## 2017-06-22 DIAGNOSIS — H353122 Nonexudative age-related macular degeneration, left eye, intermediate dry stage: Secondary | ICD-10-CM

## 2017-06-22 DIAGNOSIS — H43813 Vitreous degeneration, bilateral: Secondary | ICD-10-CM | POA: Diagnosis not present

## 2017-06-22 DIAGNOSIS — H353211 Exudative age-related macular degeneration, right eye, with active choroidal neovascularization: Secondary | ICD-10-CM | POA: Diagnosis not present

## 2017-07-07 NOTE — Progress Notes (Signed)
Cardiology Office Note    Date:  07/08/2017   ID:  Micheal [name], DOB 02/13/1941, MRN [id number]  PCP:  Micheal [name], Rupashree, MD  Cardiologist: Micheal NoeHenry W Smith III, MD   Chief Complaint  Patient presents with  . Coronary Artery Disease    History of Present Illness:  Micheal [name] is a 76 y.o. male has a history of CAD with prior CABG back in 2008, DM, hyperlipidemia, hypertension, and exertional lower extremity .  He cut his statin dose and have and now feels that his legs are stronger and he has more exertional tolerance.  He denies chest pain, dyspnea, palpitations, syncope, and edema.  There is no orthopnea.  He is quite pleased with his overall cardiovascular condition.  States that he had a peripheral arterial evaluation done at the [facility] and was told he did not have PAD.  This study was done sometime in the last 3 years.   Past Medical History:  Diagnosis Date  . CAD (coronary artery disease)    with Cabg 2008  . Claudication (HCC)   . Diabetes (HCC)   . HTN (hypertension)   . Hypercholesteremia   . Hyperlipidemia     Past Surgical History:  Procedure Laterality Date  . Coronary bypass surgery     2008    Current Medications: Outpatient Medications Prior to Visit  Medication Sig Dispense Refill  . atorvastatin (LIPITOR) 10 MG tablet Take 0.5 tablets (5 mg total) by mouth daily. 45 tablet 3  . Coenzyme Q10 (CO Q-10) 200 MG CAPS Take 1 capsule by mouth daily.    . Cyanocobalamin (VITAMIN B 12 PO) Take 1,000 mg by mouth daily.    . Ferrous Sulfate (IRON) 28 MG TABS Take by mouth.    . losartan (COZAAR) 50 MG tablet Take 50 mg by mouth daily.  2  . metFORMIN (GLUCOPHAGE) 1000 MG tablet Take half (1/2) tablet (500 mg total) by mouth every morning and take one (1) tablet (1,000 mg total) by mouth every evening.    . metoprolol succinate (TOPROL-XL) 50 MG 24 hr tablet Take 1 tablet (50 mg total) daily by mouth. 90 tablet 0  . SIMBRINZA 1-0.2 % SUSP Place 1 drop  into both eyes 2 (two) times daily.     No facility-administered medications prior to visit.      Allergies:   Patient has no known allergies.   Social History   Socioeconomic History  . Marital status: Married    Spouse name: None  . Number of children: None  . Years of education: None  . Highest education level: None  Social Needs  . Financial resource strain: None  . Food insecurity - worry: None  . Food insecurity - inability: None  . Transportation needs - medical: None  . Transportation needs - non-medical: None  Occupational History  . None  Tobacco Use  . Smoking status: Former Games developermoker  . Smokeless tobacco: Never Used  Substance and Sexual Activity  . Alcohol use: Yes    Alcohol/week: 0.6 oz    Types: 1 Glasses of wine per week  . Drug use: No  . Sexual activity: Yes  Other Topics Concern  . None  Social History Narrative  . None     Family History:  The patient's family history includes Healthy in his sister, sister, and sister; Heart attack in his maternal grandfather; Heart disease in his father.   ROS:   Please see the history of present illness.  Leg weakness and fatigue. All other systems reviewed and are negative.   PHYSICAL EXAM:   VS:  BP (!) 162/68   Pulse (!) 55   Ht 5\' 9"  (1.753 m)   Wt 179 lb (81.2 kg)   BMI 26.43 kg/m    Repeat blood pressure 140/70 mmHg GEN: Well nourished, well developed, in no acute distress  HEENT: normal  Neck: no JVD, carotid bruits, or masses Cardiac: RRR; no murmurs, rubs, or gallops,no edema  Respiratory:  clear to auscultation bilaterally, normal work of breathing GI: soft, nontender, nondistended, + BS MS: no deformity or atrophy  Skin: warm and dry, no rash Neuro:  Alert and Oriented x 3, Strength and sensation are intact Psych: euthymic mood, full affect  Wt Readings from Last 3 Encounters:  07/08/17 179 lb (81.2 kg)  06/02/16 181 lb 12.8 oz (82.5 kg)  04/12/15 183 lb (83 kg)      Studies/Labs  Reviewed:   EKG:  EKG sinus bradycardia, 55 bpm.  Nonspecific T wave flattening.  Recent Labs: No results found for requested labs within last 8760 hours.   Lipid Panel    Component Value Date/Time   CHOL 131 04/30/2016 0928   TRIG 160 (H) 04/30/2016 0928   HDL 38 (L) 04/30/2016 0928   CHOLHDL 3.4 04/30/2016 0928   VLDL 32 (H) 04/30/2016 0928   LDLCALC 61 04/30/2016 0928    Additional studies/ records that were reviewed today include:  No functional studies    ASSESSMENT:    1. Coronary artery disease involving native coronary artery of native heart without angina pectoris   2. Essential hypertension   3. Type 2 diabetes mellitus with complication, without long-term current use of insulin (HCC)   4. Pure hypercholesterolemia   5. Claudication [facility](HCC)      PLAN:  In order of problems listed above:  1. Asymptomatic and quite physically active.  No functional studies are recommended at this time. 2. Systolic blood pressure is but not at target for a diabetic.  Goal is 130/85 mmHg or less.  Decrease salt in diet.  Consider adding HCTZ 12.5 mg to his current regimen.  If this is done he could easily be switched to Hyzaar 50/12.5 mg. 3. States his hemoglobin A1c was near 7 when performed recently. 4. On very low-dose atorvastatin.  He independently decrease his dose from 10 to 5 mg/day and noticed a commensurate improvement in lower extremity strength.  I have recommended that he discontinue the medication for 4-6 weeks, and objectively report to us on lower extremity strength off statin therapy.  Depending upon his report, he may attempt switching to another statin or a PCS-K 9. 5. Has full lower extremity arterial pulses.  By word of mouth he states that prior lower extremity ABI/Doppler study was normal.  Overall doing well.  No symptoms from cardiac standpoint.  Risk factor modification including A1c less than 7, blood pressure target is noted above.  LDL cholesterol less than  or equal to 70.    Medication Adjustments/Labs and Tests Ordered: Current medicines are reviewed at length with the patient today.  Concerns regarding medicines are outlined above.  Medication changes, Labs and Tests ordered today are listed in the Patient Instructions below. There are no Patient Instructions on file for this visit.   Signed, Micheal NoeHenry W Smith III, MD  07/08/2017 4:31 PM    [facility] [address], [place], [place]  [id number] Phone: [phone]; Fax: [phone]

## 2017-07-08 ENCOUNTER — Encounter: Payer: Self-pay | Admitting: Interventional Cardiology

## 2017-07-08 ENCOUNTER — Ambulatory Visit (INDEPENDENT_AMBULATORY_CARE_PROVIDER_SITE_OTHER): Payer: Medicare Other | Admitting: Interventional Cardiology

## 2017-07-08 VITALS — BP 162/68 | HR 55 | Ht 69.0 in | Wt 179.0 lb

## 2017-07-08 DIAGNOSIS — E118 Type 2 diabetes mellitus with unspecified complications: Secondary | ICD-10-CM | POA: Diagnosis not present

## 2017-07-08 DIAGNOSIS — E7800 Pure hypercholesterolemia, unspecified: Secondary | ICD-10-CM

## 2017-07-08 DIAGNOSIS — I739 Peripheral vascular disease, unspecified: Secondary | ICD-10-CM

## 2017-07-08 DIAGNOSIS — I10 Essential (primary) hypertension: Secondary | ICD-10-CM

## 2017-07-08 DIAGNOSIS — I2510 Atherosclerotic heart disease of native coronary artery without angina pectoris: Secondary | ICD-10-CM

## 2017-07-08 NOTE — Patient Instructions (Signed)
Medication Instructions:  1) DISCONTINUE Atorvastatin.  Please call the office in a month and let us know if leg weakness and fatigue resolve after stopping this medication.  Labwork: None  Testing/Procedures: None  Follow-Up: Your physician wants you to follow-up in: 1 year with Dr. [name].  You will receive a reminder letter in the mail two months in advance. If you don't receive a letter, please call our office to schedule the follow-up appointment.   Any Other Special Instructions Will Be Listed Below (If Applicable).     If you need a refill on your cardiac medications before your next appointment, please call your pharmacy.

## 2017-07-17 ENCOUNTER — Encounter (INDEPENDENT_AMBULATORY_CARE_PROVIDER_SITE_OTHER): Payer: Medicare Other | Admitting: Ophthalmology

## 2017-07-17 DIAGNOSIS — H353122 Nonexudative age-related macular degeneration, left eye, intermediate dry stage: Secondary | ICD-10-CM | POA: Diagnosis not present

## 2017-07-17 DIAGNOSIS — H353211 Exudative age-related macular degeneration, right eye, with active choroidal neovascularization: Secondary | ICD-10-CM | POA: Diagnosis not present

## 2017-07-17 DIAGNOSIS — H43813 Vitreous degeneration, bilateral: Secondary | ICD-10-CM | POA: Diagnosis not present

## 2017-08-11 DIAGNOSIS — E119 Type 2 diabetes mellitus without complications: Secondary | ICD-10-CM | POA: Diagnosis not present

## 2017-08-11 DIAGNOSIS — Z6826 Body mass index (BMI) 26.0-26.9, adult: Secondary | ICD-10-CM | POA: Diagnosis not present

## 2017-08-11 DIAGNOSIS — Z1389 Encounter for screening for other disorder: Secondary | ICD-10-CM | POA: Diagnosis not present

## 2017-08-11 DIAGNOSIS — Z0000 Encounter for general adult medical examination without abnormal findings: Secondary | ICD-10-CM | POA: Diagnosis not present

## 2017-08-11 DIAGNOSIS — I10 Essential (primary) hypertension: Secondary | ICD-10-CM | POA: Diagnosis not present

## 2017-08-11 DIAGNOSIS — E785 Hyperlipidemia, unspecified: Secondary | ICD-10-CM | POA: Diagnosis not present

## 2017-08-12 DIAGNOSIS — Z125 Encounter for screening for malignant neoplasm of prostate: Secondary | ICD-10-CM | POA: Diagnosis not present

## 2017-08-14 ENCOUNTER — Encounter (INDEPENDENT_AMBULATORY_CARE_PROVIDER_SITE_OTHER): Payer: Medicare Other | Admitting: Ophthalmology

## 2017-08-14 DIAGNOSIS — H43813 Vitreous degeneration, bilateral: Secondary | ICD-10-CM

## 2017-08-14 DIAGNOSIS — H353122 Nonexudative age-related macular degeneration, left eye, intermediate dry stage: Secondary | ICD-10-CM | POA: Diagnosis not present

## 2017-08-14 DIAGNOSIS — H353211 Exudative age-related macular degeneration, right eye, with active choroidal neovascularization: Secondary | ICD-10-CM | POA: Diagnosis not present

## 2017-08-31 ENCOUNTER — Other Ambulatory Visit: Payer: Self-pay | Admitting: Interventional Cardiology

## 2017-09-10 DIAGNOSIS — H401131 Primary open-angle glaucoma, bilateral, mild stage: Secondary | ICD-10-CM | POA: Diagnosis not present

## 2017-09-16 ENCOUNTER — Encounter (INDEPENDENT_AMBULATORY_CARE_PROVIDER_SITE_OTHER): Payer: Medicare Other | Admitting: Ophthalmology

## 2017-09-16 DIAGNOSIS — H43813 Vitreous degeneration, bilateral: Secondary | ICD-10-CM

## 2017-09-16 DIAGNOSIS — H353211 Exudative age-related macular degeneration, right eye, with active choroidal neovascularization: Secondary | ICD-10-CM

## 2017-09-16 DIAGNOSIS — I10 Essential (primary) hypertension: Secondary | ICD-10-CM | POA: Diagnosis not present

## 2017-09-16 DIAGNOSIS — H35033 Hypertensive retinopathy, bilateral: Secondary | ICD-10-CM

## 2017-09-16 DIAGNOSIS — H353122 Nonexudative age-related macular degeneration, left eye, intermediate dry stage: Secondary | ICD-10-CM

## 2017-09-30 ENCOUNTER — Ambulatory Visit (INDEPENDENT_AMBULATORY_CARE_PROVIDER_SITE_OTHER): Payer: Medicare Other | Admitting: Ophthalmology

## 2017-10-21 ENCOUNTER — Encounter (INDEPENDENT_AMBULATORY_CARE_PROVIDER_SITE_OTHER): Payer: Medicare Other | Admitting: Ophthalmology

## 2017-10-21 DIAGNOSIS — H353122 Nonexudative age-related macular degeneration, left eye, intermediate dry stage: Secondary | ICD-10-CM | POA: Diagnosis not present

## 2017-10-21 DIAGNOSIS — H353211 Exudative age-related macular degeneration, right eye, with active choroidal neovascularization: Secondary | ICD-10-CM | POA: Diagnosis not present

## 2017-10-21 DIAGNOSIS — H43813 Vitreous degeneration, bilateral: Secondary | ICD-10-CM

## 2017-11-25 ENCOUNTER — Encounter (INDEPENDENT_AMBULATORY_CARE_PROVIDER_SITE_OTHER): Payer: Medicare Other | Admitting: Ophthalmology

## 2017-11-25 DIAGNOSIS — H353122 Nonexudative age-related macular degeneration, left eye, intermediate dry stage: Secondary | ICD-10-CM | POA: Diagnosis not present

## 2017-11-25 DIAGNOSIS — H43813 Vitreous degeneration, bilateral: Secondary | ICD-10-CM | POA: Diagnosis not present

## 2017-11-25 DIAGNOSIS — H353211 Exudative age-related macular degeneration, right eye, with active choroidal neovascularization: Secondary | ICD-10-CM

## 2017-12-30 ENCOUNTER — Encounter (INDEPENDENT_AMBULATORY_CARE_PROVIDER_SITE_OTHER): Payer: Medicare Other | Admitting: Ophthalmology

## 2017-12-30 DIAGNOSIS — H353122 Nonexudative age-related macular degeneration, left eye, intermediate dry stage: Secondary | ICD-10-CM | POA: Diagnosis not present

## 2017-12-30 DIAGNOSIS — H43813 Vitreous degeneration, bilateral: Secondary | ICD-10-CM | POA: Diagnosis not present

## 2017-12-30 DIAGNOSIS — H353211 Exudative age-related macular degeneration, right eye, with active choroidal neovascularization: Secondary | ICD-10-CM

## 2018-01-21 DIAGNOSIS — H1045 Other chronic allergic conjunctivitis: Secondary | ICD-10-CM | POA: Diagnosis not present

## 2018-01-21 DIAGNOSIS — H353121 Nonexudative age-related macular degeneration, left eye, early dry stage: Secondary | ICD-10-CM | POA: Diagnosis not present

## 2018-01-21 DIAGNOSIS — H353211 Exudative age-related macular degeneration, right eye, with active choroidal neovascularization: Secondary | ICD-10-CM | POA: Diagnosis not present

## 2018-01-21 DIAGNOSIS — H401131 Primary open-angle glaucoma, bilateral, mild stage: Secondary | ICD-10-CM | POA: Diagnosis not present

## 2018-02-11 ENCOUNTER — Encounter (INDEPENDENT_AMBULATORY_CARE_PROVIDER_SITE_OTHER): Payer: Medicare Other | Admitting: Ophthalmology

## 2018-02-11 DIAGNOSIS — H353122 Nonexudative age-related macular degeneration, left eye, intermediate dry stage: Secondary | ICD-10-CM

## 2018-02-11 DIAGNOSIS — H43813 Vitreous degeneration, bilateral: Secondary | ICD-10-CM

## 2018-02-11 DIAGNOSIS — H353211 Exudative age-related macular degeneration, right eye, with active choroidal neovascularization: Secondary | ICD-10-CM

## 2018-03-02 DIAGNOSIS — I10 Essential (primary) hypertension: Secondary | ICD-10-CM | POA: Diagnosis not present

## 2018-03-02 DIAGNOSIS — I739 Peripheral vascular disease, unspecified: Secondary | ICD-10-CM | POA: Diagnosis not present

## 2018-03-02 DIAGNOSIS — E785 Hyperlipidemia, unspecified: Secondary | ICD-10-CM | POA: Diagnosis not present

## 2018-03-02 DIAGNOSIS — E119 Type 2 diabetes mellitus without complications: Secondary | ICD-10-CM | POA: Diagnosis not present

## 2018-03-02 DIAGNOSIS — H401131 Primary open-angle glaucoma, bilateral, mild stage: Secondary | ICD-10-CM | POA: Diagnosis not present

## 2018-03-02 DIAGNOSIS — Z7984 Long term (current) use of oral hypoglycemic drugs: Secondary | ICD-10-CM | POA: Diagnosis not present

## 2018-03-05 DIAGNOSIS — H401131 Primary open-angle glaucoma, bilateral, mild stage: Secondary | ICD-10-CM | POA: Diagnosis not present

## 2018-03-21 DIAGNOSIS — Z23 Encounter for immunization: Secondary | ICD-10-CM | POA: Diagnosis not present

## 2018-04-07 ENCOUNTER — Encounter (INDEPENDENT_AMBULATORY_CARE_PROVIDER_SITE_OTHER): Payer: Medicare Other | Admitting: Ophthalmology

## 2018-04-07 DIAGNOSIS — H353231 Exudative age-related macular degeneration, bilateral, with active choroidal neovascularization: Secondary | ICD-10-CM | POA: Diagnosis not present

## 2018-04-07 DIAGNOSIS — H43813 Vitreous degeneration, bilateral: Secondary | ICD-10-CM | POA: Diagnosis not present

## 2018-04-09 ENCOUNTER — Encounter (INDEPENDENT_AMBULATORY_CARE_PROVIDER_SITE_OTHER): Payer: Medicare Other | Admitting: Ophthalmology

## 2018-04-19 ENCOUNTER — Encounter

## 2018-04-19 ENCOUNTER — Encounter (INDEPENDENT_AMBULATORY_CARE_PROVIDER_SITE_OTHER): Payer: Medicare Other | Admitting: Ophthalmology

## 2018-04-19 DIAGNOSIS — H318 Other specified disorders of choroid: Secondary | ICD-10-CM | POA: Diagnosis not present

## 2018-04-26 ENCOUNTER — Encounter (INDEPENDENT_AMBULATORY_CARE_PROVIDER_SITE_OTHER): Payer: Medicare Other | Admitting: Ophthalmology

## 2018-04-26 DIAGNOSIS — H318 Other specified disorders of choroid: Secondary | ICD-10-CM | POA: Diagnosis not present

## 2018-04-26 DIAGNOSIS — H353121 Nonexudative age-related macular degeneration, left eye, early dry stage: Secondary | ICD-10-CM

## 2018-05-22 ENCOUNTER — Other Ambulatory Visit: Payer: Self-pay | Admitting: Interventional Cardiology

## 2018-06-09 ENCOUNTER — Encounter (INDEPENDENT_AMBULATORY_CARE_PROVIDER_SITE_OTHER): Payer: Medicare Other | Admitting: Ophthalmology

## 2018-06-09 DIAGNOSIS — H43813 Vitreous degeneration, bilateral: Secondary | ICD-10-CM

## 2018-06-09 DIAGNOSIS — H353231 Exudative age-related macular degeneration, bilateral, with active choroidal neovascularization: Secondary | ICD-10-CM | POA: Diagnosis not present

## 2018-06-19 DIAGNOSIS — Z23 Encounter for immunization: Secondary | ICD-10-CM | POA: Diagnosis not present

## 2018-07-07 ENCOUNTER — Encounter (INDEPENDENT_AMBULATORY_CARE_PROVIDER_SITE_OTHER): Payer: Medicare Other | Admitting: Ophthalmology

## 2018-07-08 ENCOUNTER — Encounter (INDEPENDENT_AMBULATORY_CARE_PROVIDER_SITE_OTHER): Payer: Medicare Other | Admitting: Ophthalmology

## 2018-07-08 DIAGNOSIS — H353231 Exudative age-related macular degeneration, bilateral, with active choroidal neovascularization: Secondary | ICD-10-CM | POA: Diagnosis not present

## 2018-07-08 DIAGNOSIS — H43813 Vitreous degeneration, bilateral: Secondary | ICD-10-CM

## 2018-07-15 ENCOUNTER — Encounter (INDEPENDENT_AMBULATORY_CARE_PROVIDER_SITE_OTHER): Payer: Medicare Other | Admitting: Ophthalmology

## 2018-07-15 DIAGNOSIS — H353221 Exudative age-related macular degeneration, left eye, with active choroidal neovascularization: Secondary | ICD-10-CM

## 2018-08-04 ENCOUNTER — Encounter (INDEPENDENT_AMBULATORY_CARE_PROVIDER_SITE_OTHER): Payer: Medicare Other | Admitting: Ophthalmology

## 2018-08-04 DIAGNOSIS — H353231 Exudative age-related macular degeneration, bilateral, with active choroidal neovascularization: Secondary | ICD-10-CM | POA: Diagnosis not present

## 2018-08-04 DIAGNOSIS — H43813 Vitreous degeneration, bilateral: Secondary | ICD-10-CM

## 2018-08-05 DIAGNOSIS — H401131 Primary open-angle glaucoma, bilateral, mild stage: Secondary | ICD-10-CM | POA: Diagnosis not present

## 2018-08-11 ENCOUNTER — Encounter (INDEPENDENT_AMBULATORY_CARE_PROVIDER_SITE_OTHER): Payer: Medicare Other | Admitting: Ophthalmology

## 2018-08-11 DIAGNOSIS — H353221 Exudative age-related macular degeneration, left eye, with active choroidal neovascularization: Secondary | ICD-10-CM | POA: Diagnosis not present

## 2018-08-16 DIAGNOSIS — H401131 Primary open-angle glaucoma, bilateral, mild stage: Secondary | ICD-10-CM | POA: Diagnosis not present

## 2018-08-16 DIAGNOSIS — I10 Essential (primary) hypertension: Secondary | ICD-10-CM | POA: Diagnosis not present

## 2018-08-16 DIAGNOSIS — E119 Type 2 diabetes mellitus without complications: Secondary | ICD-10-CM | POA: Diagnosis not present

## 2018-08-16 DIAGNOSIS — Z1389 Encounter for screening for other disorder: Secondary | ICD-10-CM | POA: Diagnosis not present

## 2018-08-16 DIAGNOSIS — J069 Acute upper respiratory infection, unspecified: Secondary | ICD-10-CM | POA: Diagnosis not present

## 2018-08-16 DIAGNOSIS — E785 Hyperlipidemia, unspecified: Secondary | ICD-10-CM | POA: Diagnosis not present

## 2018-08-16 DIAGNOSIS — I739 Peripheral vascular disease, unspecified: Secondary | ICD-10-CM | POA: Diagnosis not present

## 2018-08-16 DIAGNOSIS — Z0000 Encounter for general adult medical examination without abnormal findings: Secondary | ICD-10-CM | POA: Diagnosis not present

## 2018-08-17 ENCOUNTER — Other Ambulatory Visit: Payer: Self-pay | Admitting: Interventional Cardiology

## 2018-08-17 ENCOUNTER — Other Ambulatory Visit: Payer: Self-pay | Admitting: Internal Medicine

## 2018-08-17 ENCOUNTER — Other Ambulatory Visit: Payer: Self-pay

## 2018-08-17 DIAGNOSIS — I739 Peripheral vascular disease, unspecified: Secondary | ICD-10-CM

## 2018-08-20 ENCOUNTER — Ambulatory Visit
Admission: RE | Admit: 2018-08-20 | Discharge: 2018-08-20 | Disposition: A | Discharge status: Home / Self Care | Payer: Medicare Other | Source: Ambulatory Visit | Attending: Internal Medicine | Admitting: Internal Medicine

## 2018-08-20 DIAGNOSIS — I739 Peripheral vascular disease, unspecified: Secondary | ICD-10-CM

## 2018-08-28 ENCOUNTER — Other Ambulatory Visit: Payer: Self-pay | Admitting: Interventional Cardiology

## 2018-08-28 NOTE — Progress Notes (Signed)
Cardiology Office Note:    Date:  08/30/2018   ID:  Micheal [name], DOB 03-Jul-1941, MRN [id number]  PCP:  Lorenda Ishihara, MD  Cardiologist:  Lesleigh Noe, MD   Referring MD: Lorenda Ishihara,*   Chief Complaint  Patient presents with  . Claudication  . Coronary Artery Disease  . Hypertension    History of Present Illness:    Micheal [name] is a 78 y.o. male with a hx of CADwith prior CABG back in 2008, DM,hyperlipidemia, hypertension, and exertional lower extremity .  Difficult  walking up incline.  Discomfort goes away quickly with rest and he can walk further.  This is been progressive and is now limiting quality of life.  He stopped taking his statin because he thought the lower extremity edema was related to myopathy from statin therapy.  He is not having chest discomfort, orthopnea, PND, or peripheral edema.  There has been no nitroglycerin use.  States his blood pressures consistently above 130 mmHg.  Current therapy includes metoprolol succinate and losartan.  No medication side effects.  He has not had neurological complaints.   Past Medical History:  Diagnosis Date  . CAD (coronary artery disease)    with Cabg 2008  . Claudication (HCC)   . Diabetes (HCC)   . HTN (hypertension)   . Hypercholesteremia   . Hyperlipidemia     Past Surgical History:  Procedure Laterality Date  . Coronary bypass surgery     2008    Current Medications: Current Meds  Medication Sig  . Coenzyme Q10 (CO Q-10) 200 MG CAPS Take 1 capsule by mouth daily.  . Cyanocobalamin (VITAMIN B 12 PO) Take 1,000 mg by mouth daily.  . Ferrous Sulfate (IRON) 28 MG TABS Take by mouth.  . metFORMIN (GLUCOPHAGE) 1000 MG tablet Take half (1/2) tablet (500 mg total) by mouth every morning and take one (1) tablet (1,000 mg total) by mouth every evening.  . metoprolol succinate (TOPROL-XL) 50 MG 24 hr tablet Take 1 tablet (50 mg total) by mouth daily. Please schedule appt for  future refills. 2nd attempt.  Marland Kitchen PROLENSA 0.07 % SOLN   . [DISCONTINUED] losartan (COZAAR) 50 MG tablet Take 50 mg by mouth daily.     Allergies:   Patient has no known allergies.   Social History   Socioeconomic History  . Marital status: Married    Spouse name: Not on file  . Number of children: Not on file  . Years of education: Not on file  . Highest education level: Not on file  Occupational History  . Not on file  Social Needs  . Financial resource strain: Not on file  . Food insecurity:    Worry: Not on file    Inability: Not on file  . Transportation needs:    Medical: Not on file    Non-medical: Not on file  Tobacco Use  . Smoking status: Former Games developer  . Smokeless tobacco: Never Used  Substance and Sexual Activity  . Alcohol use: Yes    Alcohol/week: 1.0 standard drinks    Types: 1 Glasses of wine per week  . Drug use: No  . Sexual activity: Yes  Lifestyle  . Physical activity:    Days per week: Not on file    Minutes per session: Not on file  . Stress: Not on file  Relationships  . Social connections:    Talks on phone: Not on file    Gets together: Not on file  Attends religious service: Not on file    Active member of club or organization: Not on file    Attends meetings of clubs or organizations: Not on file    Relationship status: Not on file  Other Topics Concern  . Not on file  Social History Narrative  . Not on file     Family History: The patient's family history includes Healthy in his sister, sister, and sister; Heart attack in his maternal grandfather; Heart disease in his father.  ROS:   Please see the history of present illness.    Leg pain, equally severe bilateral.  All other systems reviewed and are negative.  EKGs/Labs/Other Studies Reviewed:    The following studies were reviewed today: Arterial vascular ultrasound 08/20/2018 IMPRESSION: Left ABI is 0.84 and compatible with mild peripheral arterial disease. Left lower  extremity arterial disease is likely involving the runoff vessels based on the pressure gradient.  Normal right ABI, measuring 1.07. No significant occlusive arterial disease in the right lower extremity.   EKG:  EKG normal sinus rhythm, nonspecific ST-T wave abnormality, premature ventricular contraction.  First-degree AV block.  When compared to the prior tracing from December 2018, no significant change has occurred.  Recent Labs: No results found for requested labs within last 8760 hours.  Recent Lipid Panel    Component Value Date/Time   CHOL 131 04/30/2016 0928   TRIG 160 (H) 04/30/2016 0928   HDL 38 (L) 04/30/2016 0928   CHOLHDL 3.4 04/30/2016 0928   VLDL 32 (H) 04/30/2016 0928   LDLCALC 61 04/30/2016 0928    Physical Exam:    VS:  BP (!) 172/74   Pulse 64   Ht 5\' 9"  (1.753 m)   Wt 171 lb 6.4 oz (77.7 kg)   SpO2 97%   BMI 25.31 kg/m     Wt Readings from Last 3 Encounters:  08/30/18 171 lb 6.4 oz (77.7 kg)  07/08/17 179 lb (81.2 kg)  06/02/16 181 lb 12.8 oz (82.5 kg)     GEN: Healthy-appearing. No acute distress HEENT: Normal NECK: No JVD. LYMPHATICS: No lymphadenopathy CARDIAC: Slight irregular with predominant RRR.  No murmur, no gallop, no edema VASCULAR: Absent pedal pulses bilaterally.  2+ bilateral radial and carotid pulses, no bruits RESPIRATORY:  Clear to auscultation without rales, wheezing or rhonchi  ABDOMEN: Soft, non-tender, non-distended, No pulsatile mass, MUSCULOSKELETAL: No deformity  SKIN: Warm and dry NEUROLOGIC:  Alert and oriented x 3 PSYCHIATRIC:  Normal affect   ASSESSMENT:    1. Coronary artery disease involving coronary bypass graft of native heart without angina pectoris   2. Mixed hyperlipidemia   3. Essential hypertension   4. Diabetes mellitus due to underlying condition with hyperosmolarity without coma, without long-term current use of insulin (HCC)    PLAN:    In order of problems listed above:  1. He is doing well  from coronary artery disease standpoint.  Secondary risk prevention is covered in some detail.  He has stopped taking statin therapy because he thought it was causing his lower extremity swelling. 2. LDL when last performed in August was 55.  He he has subsequently discontinued statin therapy.  Resume anti-lipid therapy in the form of rosuvastatin 20 mg/day.  Check lipid panel in 1 to 2 weeks at the time of other blood work noted below. 3. Blood pressure is too high.  Target is 130/80 mmHg.  Will change losartan to losartan HCTZ 50/12.5 mg/day.  Check kidney function in 7 to 14  days. 4. Hemoglobin A1c target less than 7. 5. He is having quality of life limiting claudication.  He will be referred to Dr. Kirke CorinArida or Dr. Allyson SabalBerry and will likely need imaging studies.  ABIs are not very severe.  He is a very active gentleman who has been walking on a regular basis and now is finding that his activity is being truncated.  Overall education and awareness concerning primary/secondary risk prevention was discussed in detail: LDL less than 70, hemoglobin A1c less than 7, blood pressure target less than 130/80 mmHg, >150 minutes of moderate aerobic activity per week, avoidance of smoking, weight control (via diet and exercise), and continued surveillance/management of/for obstructive sleep apnea.  Year follow-up with general cardiology   Medication Adjustments/Labs and Tests Ordered: Current medicines are reviewed at length with the patient today.  Concerns regarding medicines are outlined above.  No orders of the defined types were placed in this encounter.  No orders of the defined types were placed in this encounter.   Patient Instructions  Medication Instructions:  Your physician has recommended you make the following change in your medication:  STOP Losartan START Losartan/HCT 50 mg/12.5 mg once daily START Rosuvastatin (Crestor) 20 mg once daily  If you need a refill on your cardiac medications before  your next appointment, please call your pharmacy.   Lab work: Your physician recommends that you return for lab work in: 2-3 weeks on the same day as your appointment in Hypertension Clinic  You will need to FAST for this appointment - nothing to eat or drink after midnight the night before except water.   If you have labs (blood work) drawn today and your tests are completely normal, you will receive your results only by: Marland Kitchen. MyChart Message (if you have MyChart) OR . A paper copy in the mail If you have any lab test that is abnormal or we need to change your treatment, we will call you to review the results.   Testing/Procedures: None Ordered   Follow-Up: You have been referred to our Hypertension Clinic for management of your blood pressure   You have been [facility] our [facility] team, Drs. Allyson SabalBerry and Kirke CorinArida   At [facility], you and your health needs are our priority.  As part of our continuing mission to provide you with exceptional heart care, we have created designated Provider Care Teams.  These Care Teams include your primary Cardiologist (physician) and Advanced Practice Providers (APPs -  Physician Assistants and Nurse Practitioners) who all work together to provide you with the care you need, when you need it. You will need a follow up appointment in 1 years.  Please call our office 2 months in advance to schedule this appointment.  You may see Lesleigh NoeHenry W Boone Gear III, MD or one of the following Advanced Practice Providers on your designated Care Team:   Norma FredricksonLori Gerhardt, NP Nada BoozerLaura Ingold, NP . Georgie ChardJill McDaniel, NP       Signed, Lesleigh NoeHenry W Adriyanna Christians III, MD  08/30/2018 9:33 AM    [facility]

## 2018-08-30 ENCOUNTER — Encounter: Payer: Self-pay | Admitting: Interventional Cardiology

## 2018-08-30 ENCOUNTER — Ambulatory Visit (INDEPENDENT_AMBULATORY_CARE_PROVIDER_SITE_OTHER): Payer: Medicare Other | Admitting: Interventional Cardiology

## 2018-08-30 VITALS — BP 172/74 | HR 64 | Ht 69.0 in | Wt 171.4 lb

## 2018-08-30 DIAGNOSIS — E0800 Diabetes mellitus due to underlying condition with hyperosmolarity without nonketotic hyperglycemic-hyperosmolar coma (NKHHC): Secondary | ICD-10-CM | POA: Diagnosis not present

## 2018-08-30 DIAGNOSIS — E782 Mixed hyperlipidemia: Secondary | ICD-10-CM | POA: Diagnosis not present

## 2018-08-30 DIAGNOSIS — I739 Peripheral vascular disease, unspecified: Secondary | ICD-10-CM

## 2018-08-30 DIAGNOSIS — I25810 Atherosclerosis of coronary artery bypass graft(s) without angina pectoris: Secondary | ICD-10-CM | POA: Diagnosis not present

## 2018-08-30 DIAGNOSIS — I10 Essential (primary) hypertension: Secondary | ICD-10-CM | POA: Diagnosis not present

## 2018-08-30 MED ORDER — ROSUVASTATIN CALCIUM 20 MG PO TABS: 20.0000 mg | ORAL_TABLET | Freq: Every day | ORAL | 11 refills | Status: DC

## 2018-08-30 MED ORDER — LOSARTAN POTASSIUM-HCTZ 50-12.5 MG PO TABS: 1.0000 | ORAL_TABLET | Freq: Every day | ORAL | 11 refills | Status: DC

## 2018-08-30 NOTE — Patient Instructions (Addendum)
Medication Instructions:  Your physician has recommended you make the following change in your medication:  STOP Losartan 50 mg  START Losartan/HCT 50 mg/12.5 mg once daily STOP your cholesterol medication that you have at home START Rosuvastatin (Crestor) 20 mg once daily  If you need a refill on your cardiac medications before your next appointment, please call your pharmacy.    Lab work: Your physician recommends that you return for lab work in: 2-3 weeks on the same day as your appointment in Hypertension Clinic  You will need to FAST for this appointment - nothing to eat or drink after midnight the night before except water.     Testing/Procedures: None Ordered    Follow-Up: You have been referred to our Hypertension Clinic for management of your blood pressure - appointment needed in 2-3 weeks  You have been referred to our [facility] Cardiology team, Drs. Allyson Sabal and Arida at [employer], [address]  [place] [address]   At [facility], you and your health needs are our priority.  As part of our continuing mission to provide you with exceptional heart care, we have created designated Provider Care Teams.  These Care Teams include your primary Cardiologist (physician) and Advanced Practice Providers (APPs -  Physician Assistants and Nurse Practitioners) who all work together to provide you with the care you need, when you need it. You will need a follow up appointment in 1 years.  Please call our office 2 months in advance to schedule this appointment.  You may see Lesleigh Noe, MD or one of the following Advanced Practice Providers on your designated Care Team:   Norma Fredrickson, NP Nada Boozer, NP . Georgie Chard, NP

## 2018-09-01 ENCOUNTER — Encounter (INDEPENDENT_AMBULATORY_CARE_PROVIDER_SITE_OTHER): Payer: Medicare Other | Admitting: Ophthalmology

## 2018-09-01 DIAGNOSIS — H353231 Exudative age-related macular degeneration, bilateral, with active choroidal neovascularization: Secondary | ICD-10-CM | POA: Diagnosis not present

## 2018-09-01 DIAGNOSIS — H43813 Vitreous degeneration, bilateral: Secondary | ICD-10-CM | POA: Diagnosis not present

## 2018-09-14 ENCOUNTER — Encounter: Payer: Self-pay | Admitting: Cardiovascular Disease

## 2018-09-14 ENCOUNTER — Telehealth: Payer: Self-pay | Admitting: Cardiovascular Disease

## 2018-09-14 ENCOUNTER — Ambulatory Visit (INDEPENDENT_AMBULATORY_CARE_PROVIDER_SITE_OTHER): Payer: Medicare Other | Admitting: Cardiovascular Disease

## 2018-09-14 VITALS — BP 132/82 | HR 56 | Ht 69.0 in | Wt 172.2 lb

## 2018-09-14 DIAGNOSIS — I25810 Atherosclerosis of coronary artery bypass graft(s) without angina pectoris: Secondary | ICD-10-CM

## 2018-09-14 DIAGNOSIS — I739 Peripheral vascular disease, unspecified: Secondary | ICD-10-CM | POA: Diagnosis not present

## 2018-09-14 DIAGNOSIS — I10 Essential (primary) hypertension: Secondary | ICD-10-CM

## 2018-09-14 DIAGNOSIS — I2510 Atherosclerotic heart disease of native coronary artery without angina pectoris: Secondary | ICD-10-CM

## 2018-09-14 DIAGNOSIS — E782 Mixed hyperlipidemia: Secondary | ICD-10-CM | POA: Diagnosis not present

## 2018-09-14 DIAGNOSIS — Z01818 Encounter for other preprocedural examination: Secondary | ICD-10-CM

## 2018-09-14 NOTE — Telephone Encounter (Signed)
Patient wants to talk about upcoming 2/26 procedure.  Patient wants to know if procedure is to just eval or if something needs fixing will Dr. Kirke Corin be doing the intervention at that time.     Please call

## 2018-09-14 NOTE — Telephone Encounter (Signed)
Left a message for the patient to call back.  

## 2018-09-14 NOTE — Patient Instructions (Signed)
Medication Instructions:  No changes If you need a refill on your cardiac medications before your next appointment, please call your pharmacy.   Lab work: Your provider would like for you to have the following labs today: CBC and BMET  If you have labs (blood work) drawn today and your tests are completely normal, you will receive your results only by: Marland Kitchen MyChart Message (if you have MyChart) OR . A paper copy in the mail If you have any lab test that is abnormal or we need to change your treatment, we will call you to review the results.  Follow-Up: At [facility], you and your health needs are our priority.  As part of our continuing mission to provide you with exceptional heart care, we have created designated Provider Care Teams.  These Care Teams include your primary Cardiologist (physician) and Advanced Practice Providers (APPs -  Physician Assistants and Nurse Practitioners) who all work together to provide you with the care you need, when you need it. You will need a follow up appointment in 1 month. You may see Dr. Kirke Corin or one of the following Advanced Practice Providers on your designated Care Team:   Corine Shelter, PA-C Judy Pimple, [place] . Marjie Skiff, PA-C  Any Other Special Instructions Will Be Listed Below (If Applicable).    [facility] CARDIOVASCULAR DIVISION [facility] [address] [place] [address] Dept: [phone] Loc: [phone]  GARISON HEYSER  09/14/2018  You are scheduled for a Peripheral Angiogram on Wednesday, February 26 with Dr. Lorine Bears.  1. Please arrive at the [facility] (Main Entrance A) at [facility]: [address] [place], [place] [address] at 6:30 AM (This time is two hours before your procedure to ensure your preparation). Free valet parking service is available.   Special note: Every effort is made to have your procedure done on time. Please understand that  emergencies sometimes delay scheduled procedures.  2. Diet: Do not eat solid foods after midnight.  The patient may have clear liquids until 5am upon the day of the procedure.  3. Labs: You will need to have blood drawn today at the [facility] office. You do not need to be fasting.  4. Medication instructions in preparation for your procedure: Hold Metformin the morning of the procedure and then 48 hours after.   On the morning of your procedure, take your Aspirin and any morning medicines NOT listed above.  You may use sips of water.  5. Plan for one night stay--bring personal belongings. 6. Bring a current list of your medications and current insurance cards. 7. You MUST have a responsible person to drive you home. 8. Someone MUST be with you the first 24 hours after you arrive home or your discharge will be delayed. 9. Please wear clothes that are easy to get on and off and wear slip-on shoes.  Thank you for allowing [place] to care for you!   -- [facility]

## 2018-09-14 NOTE — Progress Notes (Signed)
Cardiology Office Note   Date:  09/14/2018   ID:  KENDARRIUS WALDECK, DOB 21-Apr-1941, MRN [id number]  PCP:  Lorenda Ishihara, MD  Cardiologist:  Dr. Katrinka Blazing   No chief complaint on file.     History of Present Illness: ARDON DUGGAL is a 78 y.o. male who was referred by Dr. Katrinka Blazing for evaluation management of peripheral arterial disease and claudication.  Patient has known history of coronary artery disease status post CABG in 2008, type 2 diabetes, hypertension and hyperlipidemia.  He is a previous smoker and quit in 2008.  He has no family history of coronary artery disease. 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 He describes 2 years history of bilateral leg pain that starts in the thigh area and goes down.  The pain is exertional that happens after walking about 100 yards.  It forces him to stop and rest for a few minutes before he can resume.  He thought that statins might be causing this but there was no significant change in his symptoms after holding the medication.  He has no rest pain or lower extremity ulceration. He underwent lower extremity arterial Doppler which showed an ABI of 1.07 on the right and 0.84 on the left.    Past Medical History:  Diagnosis Date  . CAD (coronary artery disease)    with Cabg 2008  . Claudication (HCC)   . Diabetes (HCC)   . HTN (hypertension)   . Hypercholesteremia   . Hyperlipidemia     Past Surgical History:  Procedure Laterality Date  . Coronary bypass surgery     2008     Current Outpatient Medications  Medication Sig Dispense Refill  . Coenzyme Q10 (CO Q-10) 200 MG CAPS Take 1 capsule by mouth daily.    . Cyanocobalamin (VITAMIN B 12 PO) Take 1,000 mg by mouth daily.    . Ferrous Sulfate (IRON) 28 MG TABS Take by mouth.    . losartan-hydrochlorothiazide (HYZAAR) 50-12.5 MG tablet Take 1 tablet by mouth daily. 30 tablet 11  . metFORMIN (GLUCOPHAGE) 1000 MG tablet  Take half (1/2) tablet (500 mg total) by mouth every morning and take one (1) tablet (1,000 mg total) by mouth every evening.    . metoprolol succinate (TOPROL-XL) 50 MG 24 hr tablet Take 1 tablet (50 mg total) by mouth daily. 90 tablet 3  . PROLENSA 0.07 % SOLN     . rosuvastatin (CRESTOR) 20 MG tablet Take 1 tablet (20 mg total) by mouth daily. 30 tablet 11   No current facility-administered medications for this visit.     Allergies:   Patient has no known allergies.    Social History:  The patient  reports that he has quit smoking. He has never used smokeless tobacco. He reports current alcohol use of about 1.0 standard drinks of alcohol per week. He reports that he does not use drugs.   Family History:  The patient's family history includes Healthy in his sister, sister, and sister; Heart attack in his maternal grandfather; Heart disease in his father.    ROS:  Please see the history of present illness.   Otherwise, review of systems are positive for none.   All other systems are reviewed and negative.    PHYSICAL EXAM: VS:  BP 132/82   Pulse (!) 56   Ht 5\' 9"  (1.753 m)   Wt 172 lb 3.2 oz (78.1  kg)   BMI 25.43 kg/m  , BMI Body mass index is 25.43 kg/m. GEN: Well nourished, well developed, in no acute distress  HEENT: normal  Neck: no JVD, carotid bruits, or masses Cardiac: RRR; no murmurs, rubs, or gallops,no edema  Respiratory:  clear to auscultation bilaterally, normal work of breathing GI: soft, nontender, nondistended, + BS MS: no deformity or atrophy  Skin: warm and dry, no rash Neuro:  Strength and sensation are intact Psych: euthymic mood, full affect Vascular: Femoral pulses slightly diminished bilaterally.  Dorsalis pedis is normal on the right side and not palpable on the left.  Posterior tibial is not palpable.   EKG:  EKG is not ordered today.    Recent Labs: No results found for requested labs within last 8760 hours.    Lipid Panel    Component Value  Date/Time   CHOL 131 04/30/2016 0928   TRIG 160 (H) 04/30/2016 0928   HDL 38 (L) 04/30/2016 0928   CHOLHDL 3.4 04/30/2016 0928   VLDL 32 (H) 04/30/2016 0928   LDLCALC 61 04/30/2016 0928      Wt Readings from Last 3 Encounters:  09/14/18 172 lb 3.2 oz (78.1 kg)  08/30/18 171 lb 6.4 oz (77.7 kg)  07/08/17 179 lb (81.2 kg)      No flowsheet data found.    ASSESSMENT AND PLAN:  1.  Peripheral arterial disease with severe lifestyle limiting bilateral leg claudication.  Rutherford class III.  The patient has classic symptoms of claudication that seems to be out of proportion to his vascular testing.  Given that his pain starts in the thigh area, there is a possibility of significant inflow disease especially with mildly diminished femoral pulses bilaterally. Given severity of his symptoms and his active lifestyle, I have recommended proceeding with abdominal aortogram, lower extremity runoff and possible endovascular intervention.  I discussed the procedure in details as well as risks and benefits.  2.  Coronary artery disease involving native coronary arteries without angina: Continue medical therapy.  3.  Hyperlipidemia: Currently on rosuvastatin 20 mg daily.  Previous LDL was 61.  4.  Essential hypertension: Blood pressures controlled on current medications.    Disposition:   FU with me in 1 month  Signed,   , MD  09/14/2018 1:10 PM    [facility] 

## 2018-09-14 NOTE — H&P (View-Only) (Signed)
Cardiology Office Note   Date:  09/14/2018   ID:  Micheal [name], DOB 21-Apr-1941, MRN [id number]  PCP:  Lorenda Ishihara, MD  Cardiologist:  Dr. Katrinka Blazing   No chief complaint on file.     History of Present Illness: Micheal [name] is a 78 y.o. male who was referred by Dr. Katrinka Blazing for evaluation management of peripheral arterial disease and claudication.  Patient has known history of coronary artery disease status post CABG in 2008, type 2 diabetes, hypertension and hyperlipidemia.  He is a previous smoker and quit in 2008.  He has no family history of coronary artery disease. 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 He describes 2 years history of bilateral leg pain that starts in the thigh area and goes down.  The pain is exertional that happens after walking about 100 yards.  It forces him to stop and rest for a few minutes before he can resume.  He thought that statins might be causing this but there was no significant change in his symptoms after holding the medication.  He has no rest pain or lower extremity ulceration. He underwent lower extremity arterial Doppler which showed an ABI of 1.07 on the right and 0.84 on the left.    Past Medical History:  Diagnosis Date  . CAD (coronary artery disease)    with Cabg 2008  . Claudication (HCC)   . Diabetes (HCC)   . HTN (hypertension)   . Hypercholesteremia   . Hyperlipidemia     Past Surgical History:  Procedure Laterality Date  . Coronary bypass surgery     2008     Current Outpatient Medications  Medication Sig Dispense Refill  . Coenzyme Q10 (CO Q-10) 200 MG CAPS Take 1 capsule by mouth daily.    . Cyanocobalamin (VITAMIN B 12 PO) Take 1,000 mg by mouth daily.    . Ferrous Sulfate (IRON) 28 MG TABS Take by mouth.    . losartan-hydrochlorothiazide (HYZAAR) 50-12.5 MG tablet Take 1 tablet by mouth daily. 30 tablet 11  . metFORMIN (GLUCOPHAGE) 1000 MG tablet  Take half (1/2) tablet (500 mg total) by mouth every morning and take one (1) tablet (1,000 mg total) by mouth every evening.    . metoprolol succinate (TOPROL-XL) 50 MG 24 hr tablet Take 1 tablet (50 mg total) by mouth daily. 90 tablet 3  . PROLENSA 0.07 % SOLN     . rosuvastatin (CRESTOR) 20 MG tablet Take 1 tablet (20 mg total) by mouth daily. 30 tablet 11   No current facility-administered medications for this visit.     Allergies:   Patient has no known allergies.    Social History:  The patient  reports that he has quit smoking. He has never used smokeless tobacco. He reports current alcohol use of about 1.0 standard drinks of alcohol per week. He reports that he does not use drugs.   Family History:  The patient's family history includes Healthy in his sister, sister, and sister; Heart attack in his maternal grandfather; Heart disease in his father.    ROS:  Please see the history of present illness.   Otherwise, review of systems are positive for none.   All other systems are reviewed and negative.    PHYSICAL EXAM: VS:  BP 132/82   Pulse (!) 56   Ht 5\' 9"  (1.753 m)   Wt 172 lb 3.2 oz (78.1  kg)   BMI 25.43 kg/m  , BMI Body mass index is 25.43 kg/m. GEN: Well nourished, well developed, in no acute distress  HEENT: normal  Neck: no JVD, carotid bruits, or masses Cardiac: RRR; no murmurs, rubs, or gallops,no edema  Respiratory:  clear to auscultation bilaterally, normal work of breathing GI: soft, nontender, nondistended, + BS MS: no deformity or atrophy  Skin: warm and dry, no rash Neuro:  Strength and sensation are intact Psych: euthymic mood, full affect Vascular: Femoral pulses slightly diminished bilaterally.  Dorsalis pedis is normal on the right side and not palpable on the left.  Posterior tibial is not palpable.   EKG:  EKG is not ordered today.    Recent Labs: No results found for requested labs within last 8760 hours.    Lipid Panel    Component Value  Date/Time   CHOL 131 04/30/2016 0928   TRIG 160 (H) 04/30/2016 0928   HDL 38 (L) 04/30/2016 0928   CHOLHDL 3.4 04/30/2016 0928   VLDL 32 (H) 04/30/2016 0928   LDLCALC 61 04/30/2016 0928      Wt Readings from Last 3 Encounters:  09/14/18 172 lb 3.2 oz (78.1 kg)  08/30/18 171 lb 6.4 oz (77.7 kg)  07/08/17 179 lb (81.2 kg)      No flowsheet data found.    ASSESSMENT AND PLAN:  1.  Peripheral arterial disease with severe lifestyle limiting bilateral leg claudication.  Rutherford class III.  The patient has classic symptoms of claudication that seems to be out of proportion to his vascular testing.  Given that his pain starts in the thigh area, there is a possibility of significant inflow disease especially with mildly diminished femoral pulses bilaterally. Given severity of his symptoms and his active lifestyle, I have recommended proceeding with abdominal aortogram, lower extremity runoff and possible endovascular intervention.  I discussed the procedure in details as well as risks and benefits.  2.  Coronary artery disease involving native coronary arteries without angina: Continue medical therapy.  3.  Hyperlipidemia: Currently on rosuvastatin 20 mg daily.  Previous LDL was 61.  4.  Essential hypertension: Blood pressures controlled on current medications.    Disposition:   FU with me in 1 month  Signed,  Lorine Bears, MD  09/14/2018 1:10 PM    [facility]

## 2018-09-15 ENCOUNTER — Encounter (INDEPENDENT_AMBULATORY_CARE_PROVIDER_SITE_OTHER): Payer: Medicare Other | Admitting: Ophthalmology

## 2018-09-15 DIAGNOSIS — H353231 Exudative age-related macular degeneration, bilateral, with active choroidal neovascularization: Secondary | ICD-10-CM

## 2018-09-15 LAB — BASIC METABOLIC PANEL
BUN/Creatinine Ratio: 15 (ref 10–24)
BUN: 16 mg/dL (ref 8–27)
CHLORIDE: 101 mmol/L (ref 96–106)
CO2: 24 mmol/L (ref 20–29)
Calcium: 9.7 mg/dL (ref 8.6–10.2)
Creatinine, Ser: 1.06 mg/dL (ref 0.76–1.27)
GFR calc non Af Amer: 67 mL/min/{1.73_m2} (ref >59)
GFR, EST AFRICAN AMERICAN: 77 mL/min/{1.73_m2} (ref >59)
Glucose: 105 mg/dL — ABNORMAL HIGH (ref 65–99)
POTASSIUM: 5 mmol/L (ref 3.5–5.2)
Sodium: 142 mmol/L (ref 134–144)

## 2018-09-15 LAB — CBC
Hematocrit: 43.1 % (ref 37.5–51.0)
Hemoglobin: 14.3 g/dL (ref 13.0–17.7)
MCH: 31.4 pg (ref 26.6–33.0)
MCHC: 33.2 g/dL (ref 31.5–35.7)
MCV: 95 fL (ref 79–97)
PLATELETS: 192 10*3/uL (ref 150–450)
RBC: 4.55 x10E6/uL (ref 4.14–5.80)
RDW: 13.3 % (ref 11.6–15.4)
WBC: 5.9 10*3/uL (ref 3.4–10.8)

## 2018-09-15 NOTE — Telephone Encounter (Signed)
Patient has been made aware of and educated on possible interventions if applicable during the procedure.

## 2018-09-21 ENCOUNTER — Telehealth: Payer: Self-pay | Admitting: *Deleted

## 2018-09-21 NOTE — Telephone Encounter (Signed)
Pt contacted pre-PV procedure scheduled at [facility] for: Wednesday September 22, 2018 8:30 AM Verified arrival time and place: [facility] Main Entrance A at: 6:30 AM  No solid food after midnight prior to cath, clear liquids until 5 AM day of procedure. Contrast allergy: no  Hold: Metformin-day of procedure and 48 hours post procedure. Losartan-HCTZ-AM of procedure.  Except hold medications AM meds can be  taken pre-cath with sip of water including: ASA 81 mg  Confirmed patient has responsible person to drive home post procedure and observe 24 hours after arriving home: yes

## 2018-09-22 ENCOUNTER — Other Ambulatory Visit: Payer: Self-pay

## 2018-09-22 ENCOUNTER — Encounter (HOSPITAL_COMMUNITY)
Admission: RE | Disposition: A | Discharge status: Home / Self Care | Payer: Self-pay | Source: Home / Self Care | Attending: Cardiovascular Disease

## 2018-09-22 ENCOUNTER — Ambulatory Visit (HOSPITAL_COMMUNITY)
Admission: RE | Admit: 2018-09-22 | Discharge: 2018-09-22 | Disposition: A | Discharge status: Home / Self Care | Payer: Medicare Other | Attending: Cardiovascular Disease | Admitting: Cardiovascular Disease

## 2018-09-22 ENCOUNTER — Encounter (HOSPITAL_COMMUNITY): Payer: Self-pay | Admitting: Cardiovascular Disease

## 2018-09-22 DIAGNOSIS — I2510 Atherosclerotic heart disease of native coronary artery without angina pectoris: Secondary | ICD-10-CM | POA: Diagnosis not present

## 2018-09-22 DIAGNOSIS — Z79899 Other long term (current) drug therapy: Secondary | ICD-10-CM | POA: Diagnosis not present

## 2018-09-22 DIAGNOSIS — I70213 Atherosclerosis of native arteries of extremities with intermittent claudication, bilateral legs: Secondary | ICD-10-CM | POA: Diagnosis not present

## 2018-09-22 DIAGNOSIS — Z951 Presence of aortocoronary bypass graft: Secondary | ICD-10-CM | POA: Insufficient documentation

## 2018-09-22 DIAGNOSIS — Z8249 Family history of ischemic heart disease and other diseases of the circulatory system: Secondary | ICD-10-CM | POA: Diagnosis not present

## 2018-09-22 DIAGNOSIS — E1151 Type 2 diabetes mellitus with diabetic peripheral angiopathy without gangrene: Secondary | ICD-10-CM | POA: Diagnosis not present

## 2018-09-22 DIAGNOSIS — I10 Essential (primary) hypertension: Secondary | ICD-10-CM | POA: Diagnosis not present

## 2018-09-22 DIAGNOSIS — E785 Hyperlipidemia, unspecified: Secondary | ICD-10-CM | POA: Diagnosis not present

## 2018-09-22 DIAGNOSIS — Z87891 Personal history of nicotine dependence: Secondary | ICD-10-CM | POA: Insufficient documentation

## 2018-09-22 DIAGNOSIS — Z7984 Long term (current) use of oral hypoglycemic drugs: Secondary | ICD-10-CM | POA: Insufficient documentation

## 2018-09-22 DIAGNOSIS — I739 Peripheral vascular disease, unspecified: Secondary | ICD-10-CM

## 2018-09-22 HISTORY — PX: ABDOMINAL AORTOGRAM W/LOWER EXTREMITY: CATH118223

## 2018-09-22 LAB — GLUCOSE, CAPILLARY: Glucose-Capillary: 116 mg/dL — ABNORMAL HIGH (ref 70–99)

## 2018-09-22 SURGERY — ABDOMINAL AORTOGRAM W/LOWER EXTREMITY
Anesthesia: LOCAL

## 2018-09-22 MED ORDER — SODIUM CHLORIDE 0.9 % IV SOLN: 250.0000 mL | INTRAVENOUS | Status: DC | PRN

## 2018-09-22 MED ORDER — SODIUM CHLORIDE 0.9 % WEIGHT BASED INFUSION: 1.0000 mL/kg/h | INTRAVENOUS | Status: DC

## 2018-09-22 MED ORDER — LIDOCAINE HCL (PF) 1 % IJ SOLN
INTRAMUSCULAR | Status: AC
  Filled 2018-09-22: qty 30

## 2018-09-22 MED ORDER — SODIUM CHLORIDE 0.9% FLUSH: 3.0000 mL | INTRAVENOUS | Status: DC | PRN

## 2018-09-22 MED ORDER — SODIUM CHLORIDE 0.9 % WEIGHT BASED INFUSION
3.0000 mL/kg/h | INTRAVENOUS | Status: AC
  Administered 2018-09-22: 3 mL/kg/h via INTRAVENOUS

## 2018-09-22 MED ORDER — IODIXANOL 320 MG/ML IV SOLN
INTRAVENOUS | Status: DC | PRN
  Administered 2018-09-22: 105 mL via INTRAVENOUS

## 2018-09-22 MED ORDER — LIDOCAINE HCL (PF) 1 % IJ SOLN
INTRAMUSCULAR | Status: DC | PRN
  Administered 2018-09-22: 20 mL via INTRADERMAL

## 2018-09-22 MED ORDER — FENTANYL CITRATE (PF) 100 MCG/2ML IJ SOLN
INTRAMUSCULAR | Status: AC
  Filled 2018-09-22: qty 2

## 2018-09-22 MED ORDER — FENTANYL CITRATE (PF) 100 MCG/2ML IJ SOLN
INTRAMUSCULAR | Status: DC | PRN
  Administered 2018-09-22: 25 ug via INTRAVENOUS

## 2018-09-22 MED ORDER — SODIUM CHLORIDE 0.9% FLUSH: 3.0000 mL | Freq: Two times a day (BID) | INTRAVENOUS | Status: DC

## 2018-09-22 MED ORDER — MIDAZOLAM HCL 2 MG/2ML IJ SOLN
INTRAMUSCULAR | Status: AC
  Filled 2018-09-22: qty 2

## 2018-09-22 MED ORDER — ASPIRIN 81 MG PO CHEW: 81.0000 mg | CHEWABLE_TABLET | ORAL | Status: DC

## 2018-09-22 MED ORDER — HEPARIN (PORCINE) IN NACL 1000-0.9 UT/500ML-% IV SOLN
INTRAVENOUS | Status: DC | PRN
  Administered 2018-09-22 (×2): 500 mL

## 2018-09-22 MED ORDER — HEPARIN (PORCINE) IN NACL 1000-0.9 UT/500ML-% IV SOLN
INTRAVENOUS | Status: AC
  Filled 2018-09-22: qty 1000

## 2018-09-22 MED ORDER — MIDAZOLAM HCL 2 MG/2ML IJ SOLN
INTRAMUSCULAR | Status: DC | PRN
  Administered 2018-09-22: 1 mg via INTRAVENOUS

## 2018-09-22 SURGICAL SUPPLY — 14 items
CATH ANGIO 5F PIGTAIL 65CM (CATHETERS) ×2 IMPLANT
CATH BEACON 5 .035 65 RIM TIP (CATHETERS) ×2 IMPLANT
CATH STRAIGHT 5FR 65CM (CATHETERS) ×2 IMPLANT
DEVICE CLOSURE MYNXGRIP 5F (Vascular Products) ×2 IMPLANT
KIT MICROPUNCTURE NIT STIFF (SHEATH) ×2 IMPLANT
KIT PV (KITS) ×2 IMPLANT
SHEATH PINNACLE 5F 10CM (SHEATH) ×2 IMPLANT
SHEATH PROBE COVER 6X72 (BAG) ×2 IMPLANT
STOPCOCK MORSE 400PSI 3WAY (MISCELLANEOUS) ×2 IMPLANT
SYR MEDRAD MARK 7 150ML (SYRINGE) ×2 IMPLANT
TRANSDUCER W/STOPCOCK (MISCELLANEOUS) ×2 IMPLANT
TRAY PV CATH (CUSTOM PROCEDURE TRAY) ×2 IMPLANT
TUBING CIL FLEX 10 FLL-RA (TUBING) ×2 IMPLANT
WIRE HITORQ VERSACORE ST 145CM (WIRE) ×2 IMPLANT

## 2018-09-22 NOTE — Progress Notes (Signed)
No bleeding or hematoma noted after ambulation 

## 2018-09-22 NOTE — Discharge Instructions (Signed)
Femoral Site Care °This sheet gives you information about how to care for yourself after your procedure. Your health care provider may also give you more specific instructions. If you have problems or questions, contact your health care provider. °What can I expect after the procedure? °After the procedure, it is common to have: °· Bruising that usually fades within 1-2 weeks. °· Tenderness at the site. °Follow these instructions at home: °Wound care °· Follow instructions from your health care provider about how to take care of your insertion site. Make sure you: °? Wash your hands with soap and water before you change your bandage (dressing). If soap and water are not available, use hand sanitizer. °? Change your dressing as told by your health care provider. °? Leave stitches (sutures), skin glue, or adhesive strips in place. These skin closures may need to stay in place for 2 weeks or longer. If adhesive strip edges start to loosen and curl up, you may trim the loose edges. Do not remove adhesive strips completely unless your health care provider tells you to do that. °· Do not take baths, swim, or use a hot tub until your health care provider approves. °· You may shower 24-48 hours after the procedure or as told by your health care provider. °? Gently wash the site with plain soap and water. °? Pat the area dry with a clean towel. °? Do not rub the site. This may cause bleeding. °· Do not apply powder or lotion to the site. Keep the site clean and dry. °· Check your femoral site every day for signs of infection. Check for: °? Redness, swelling, or pain. °? Fluid or blood. °? Warmth. °? Pus or a bad smell. °Activity °· For the first 2-3 days after your procedure, or as long as directed: °? Avoid climbing stairs as much as possible. °? Do not squat. °· Do not lift anything that is heavier than 10 lb (4.5 kg), or the limit that you are told, until your health care provider says that it is safe. °· Rest as  directed. °? Avoid sitting for a long time without moving. Get up to take short walks every 1-2 hours. °· Do not drive for 24 hours if you were given a medicine to help you relax (sedative). °General instructions °· Take over-the-counter and prescription medicines only as told by your health care provider. °· Keep all follow-up visits as told by your health care provider. This is important. °Contact a health care provider if you have: °· A fever or chills. °· You have redness, swelling, or pain around your insertion site. °Get help right away if: °· The catheter insertion area swells very fast. °· You pass out. °· You suddenly start to sweat or your skin gets clammy. °· The catheter insertion area is bleeding, and the bleeding does not stop when you hold steady pressure on the area. °· The area near or just beyond the catheter insertion site becomes pale, cool, tingly, or numb. °These symptoms may represent a serious problem that is an emergency. Do not wait to see if the symptoms will go away. Get medical help right away. Call your local emergency services (911 in the [place] Do not drive yourself to the hospital. °Summary °· After the procedure, it is common to have bruising that usually fades within 1-2 weeks. °· Check your femoral site every day for signs of infection. °· Do not lift anything that is heavier than 10 lb (4.5 kg), or the   limit that you are told, until your health care provider says that it is safe. °This information is not intended to replace advice given to you by your health care provider. Make sure you discuss any questions you have with your health care provider. °Document Released: 03/17/2014 Document Revised: 07/27/2017 Document Reviewed: 07/27/2017 °Elsevier Interactive Patient Education © 2019 [employer] ° °

## 2018-09-22 NOTE — Interval H&P Note (Signed)
History and Physical Interval Note:  09/22/2018 8:33 AM  Micheal [name]  has presented today for surgery, with the diagnosis of pad  The various methods of treatment have been discussed with the patient and family. After consideration of risks, benefits and other options for treatment, the patient has consented to  Procedure(s): ABDOMINAL AORTOGRAM W/LOWER EXTREMITY (N/A) as a surgical intervention .  The patient's history has been reviewed, patient examined, no change in status, stable for surgery.  I have reviewed the patient's chart and labs.  Questions were answered to the patient's satisfaction.     Lorine Bears

## 2018-09-23 ENCOUNTER — Telehealth: Payer: Self-pay | Admitting: *Deleted

## 2018-09-23 NOTE — Telephone Encounter (Signed)
Left message for patient to call and schedule 3-4 week post angio appointment with Dr. Kirke Corin

## 2018-09-24 ENCOUNTER — Encounter: Payer: Self-pay | Admitting: Pharmacist

## 2018-09-24 ENCOUNTER — Other Ambulatory Visit: Payer: Medicare Other | Admitting: *Deleted

## 2018-09-24 ENCOUNTER — Ambulatory Visit (INDEPENDENT_AMBULATORY_CARE_PROVIDER_SITE_OTHER): Payer: Medicare Other | Admitting: Pharmacist

## 2018-09-24 VITALS — BP 172/70 | HR 62

## 2018-09-24 DIAGNOSIS — I10 Essential (primary) hypertension: Secondary | ICD-10-CM

## 2018-09-24 DIAGNOSIS — E0800 Diabetes mellitus due to underlying condition with hyperosmolarity without nonketotic hyperglycemic-hyperosmolar coma (NKHHC): Secondary | ICD-10-CM

## 2018-09-24 DIAGNOSIS — I25810 Atherosclerosis of coronary artery bypass graft(s) without angina pectoris: Secondary | ICD-10-CM

## 2018-09-24 DIAGNOSIS — E782 Mixed hyperlipidemia: Secondary | ICD-10-CM

## 2018-09-24 DIAGNOSIS — I739 Peripheral vascular disease, unspecified: Secondary | ICD-10-CM | POA: Diagnosis not present

## 2018-09-24 LAB — HEPATIC FUNCTION PANEL
ALT: 21 IU/L (ref 0–44)
AST: 13 IU/L (ref 0–40)
Albumin: 4.2 g/dL (ref 3.7–4.7)
Alkaline Phosphatase: 71 IU/L (ref 39–117)
Bilirubin Total: 1.1 mg/dL (ref 0.0–1.2)
Bilirubin, Direct: 0.27 mg/dL (ref 0.00–0.40)
Total Protein: 6.7 g/dL (ref 6.0–8.5)

## 2018-09-24 LAB — LIPID PANEL
CHOL/HDL RATIO: 2.2 ratio (ref 0.0–5.0)
Cholesterol, Total: 108 mg/dL (ref 100–199)
HDL: 50 mg/dL (ref >39)
LDL Calculated: 37 mg/dL (ref 0–99)
Triglycerides: 106 mg/dL (ref 0–149)
VLDL Cholesterol Cal: 21 mg/dL (ref 5–40)

## 2018-09-24 LAB — BASIC METABOLIC PANEL
BUN/Creatinine Ratio: 13 (ref 10–24)
BUN: 13 mg/dL (ref 8–27)
CO2: 26 mmol/L (ref 20–29)
Calcium: 9.2 mg/dL (ref 8.6–10.2)
Chloride: 101 mmol/L (ref 96–106)
Creatinine, Ser: 0.99 mg/dL (ref 0.76–1.27)
GFR calc non Af Amer: 73 mL/min/{1.73_m2} (ref >59)
GFR, EST AFRICAN AMERICAN: 84 mL/min/{1.73_m2} (ref >59)
Glucose: 152 mg/dL — ABNORMAL HIGH (ref 65–99)
Potassium: 4.7 mmol/L (ref 3.5–5.2)
Sodium: 140 mmol/L (ref 134–144)

## 2018-09-24 NOTE — Patient Instructions (Signed)
Restart your blood pressure medications today.  Check your blood pressure at home and bring your log and your meter with you to your next appointment.  Call us at [phone] with any questions or concerns.

## 2018-09-24 NOTE — Telephone Encounter (Signed)
Left message for patient to call and schedule post angio appointment with Dr. Kirke Corin in 3-4 weeks

## 2018-09-24 NOTE — Progress Notes (Signed)
Patient ID: Micheal [name]                 DOB: 14-Jun-1941                      MRN: [id number]     HPI: Micheal [name] is a 78 y.o. male referred by Dr. Shelba Flake to HTN clinic. PMH is significant for CAD s/p CABG in 2008, HTN, HLD, PAD and DM. At last visit with Dr. Katrinka Blazing on 08/30/2018 patient blood pressure was elevated at 172/74. Losartan 50mg  was changed to losartan/HCTZ 50/12.5mg . Follow up BMP was stable.  Patient presents today in good spirits. He had an abdominal aortogram with lower extremity done on Wed, but unfortunately the cause of his leg pain was not found. Patient knew that he wasn't supposed to resume his metformin until today, but was unsure about his other medications, so he hasn't had his blood pressure medications in three days. He states that he does check his blood pressure at home and it is typically is around 129/74.   Patient denies dizziness, lightheadedness, falls and fatigue. Patient states his heart rate is always in the 50's and he feels just fine.  Patient also states he doesn't know why everyone tells him why he needed to be on a statin. So I reviewed with him how statins work and the importance of them. He had a lipid panel drawn this morning.  Current HTN meds: losartan/HCTZ 50/12.5mg  daily, metoprolol succinate 50mg  daily BP goal: <130/80  Family History: The patient's family history includes Healthy in his sister, sister, and sister; Heart attack in his maternal grandfather; Heart disease in his father.   Social History: The patient  reports that he has quit smoking. He has never used smokeless tobacco. He reports current alcohol use of about 1.0 standard drinks of alcohol per week. He reports that he does not use drugs.   Exercise: golf 3 days a week. He can walk at a steady pace, but if he increases the pace or tries to walk up hill, he gets the pain in his leg  Home BP readings: 129/74  Wt Readings from Last 3 Encounters:  09/22/18 171 lb (77.6 kg)    09/14/18 172 lb 3.2 oz (78.1 kg)  08/30/18 171 lb 6.4 oz (77.7 kg)   BP Readings from Last 3 Encounters:  09/22/18 (!) 178/62  09/14/18 132/82  08/30/18 (!) 172/74   Pulse Readings from Last 3 Encounters:  09/22/18 (!) 58  09/14/18 (!) 56  08/30/18 64    Renal function: Estimated Creatinine Clearance: 55.6 mL/min (by C-G formula based on SCr of 1.06 mg/dL).  Past Medical History:  Diagnosis Date  . CAD (coronary artery disease)    with Cabg 2008  . Claudication (HCC)   . Diabetes (HCC)   . HTN (hypertension)   . Hypercholesteremia   . Hyperlipidemia     Current Outpatient Medications on File Prior to Visit  Medication Sig Dispense Refill  . cholecalciferol (VITAMIN D3) 25 MCG (1000 UT) tablet Take 1,000 Units by mouth daily.    . Coenzyme Q10 (CO Q-10) 200 MG CAPS Take 200 mg by mouth daily.     . Cyanocobalamin (VITAMIN B 12 PO) Take 1,000 mcg by mouth daily.     . dorzolamide (TRUSOPT) 2 % ophthalmic solution Place 1 drop into both eyes 2 (two) times daily.    . Glucosamine-Chondroitin (GLUCOSAMINE CHONDR COMPLEX PO) Take 1 tablet by mouth daily.    Marland Kitchen  losartan-hydrochlorothiazide (HYZAAR) 50-12.5 MG tablet Take 1 tablet by mouth daily. 30 tablet 11  . metFORMIN (GLUCOPHAGE) 1000 MG tablet Take 1,000 mg by mouth at bedtime.     . metoprolol succinate (TOPROL-XL) 50 MG 24 hr tablet Take 1 tablet (50 mg total) by mouth daily. 90 tablet 3  . Multiple Vitamins-Minerals (PRESERVISION AREDS 2 PO) Take 1 capsule by mouth 2 (two) times daily.    . rosuvastatin (CRESTOR) 20 MG tablet Take 1 tablet (20 mg total) by mouth daily. 30 tablet 11  . vitamin E 400 UNIT capsule Take 400 Units by mouth daily.     No current facility-administered medications on file prior to visit.     No Known Allergies  There were no vitals taken for this visit.   Assessment/Plan:  1. Hypertension - Blood pressure is not at goal of <130/80 today in clinic. However, patient has not had his blood  pressure medication in 3 days. Home blood pressure readings patient reports are at goal. Will not make any changes today. Will have patient resume his losartan/HCTZ 50/12.5mg  and metoprolol succinate 50mg  daily and follow up in clinic in 3 weeks. Patient was asked to bring blood pressure log and meter with him to his next appointment.   Thank you  Olene Floss, Pharm.D, BCPS [facility]  [address], [place], [place] [address]  Phone: [phone]; Fax: [phone]

## 2018-10-11 ENCOUNTER — Telehealth: Payer: Self-pay

## 2018-10-11 NOTE — Telephone Encounter (Signed)
-----   Message from Awilda Metro, [facility] sent at 10/11/2018  9:57 AM EDT ----- Can move out blood pressure visit 2 weeks

## 2018-10-11 NOTE — Telephone Encounter (Signed)
Called to r/s appointment due to virus concerns pt compliant

## 2018-10-13 ENCOUNTER — Encounter (INDEPENDENT_AMBULATORY_CARE_PROVIDER_SITE_OTHER): Payer: Medicare Other | Admitting: Ophthalmology

## 2018-10-13 ENCOUNTER — Other Ambulatory Visit: Payer: Self-pay

## 2018-10-13 DIAGNOSIS — H353231 Exudative age-related macular degeneration, bilateral, with active choroidal neovascularization: Secondary | ICD-10-CM | POA: Diagnosis not present

## 2018-10-13 DIAGNOSIS — H43813 Vitreous degeneration, bilateral: Secondary | ICD-10-CM

## 2018-10-14 ENCOUNTER — Ambulatory Visit: Payer: Medicare Other

## 2018-10-18 ENCOUNTER — Telehealth: Payer: Self-pay | Admitting: *Deleted

## 2018-10-18 NOTE — Telephone Encounter (Signed)
   Primary Cardiologist: Lorine Bears, MD  Patient contacted.  History reviewed.  No symptoms to suggest any unstable cardiac conditions.  Based on discussion, with current pandemic situation, we will be postponing this appointment for this patient.  If symptoms change, he has been instructed to contact our office.   Routing to C19 CANCEL pool for tracking (P CV DIV CV19 CANCEL) and assigning priority (1 = 4-6 wks, 2 = 6-12 wks, 3 = >12 wks).     Cardiac Questionnaire:    Since your last visit or hospitalization:    1. Have you been having new or worsening chest pain? No   2. Have you been having new or worsening shortness of breath? No 3. Have you been having new or worsening leg swelling, wt gain, or increase in abdominal girth (pants fitting more tightly)? No   4. Have you had any passing out spells? No                 .

## 2018-10-19 ENCOUNTER — Ambulatory Visit: Payer: Medicare Other | Admitting: Cardiovascular Disease

## 2018-10-21 ENCOUNTER — Other Ambulatory Visit: Payer: Self-pay

## 2018-10-21 ENCOUNTER — Encounter (INDEPENDENT_AMBULATORY_CARE_PROVIDER_SITE_OTHER): Payer: Medicare Other | Admitting: Ophthalmology

## 2018-10-21 DIAGNOSIS — H353221 Exudative age-related macular degeneration, left eye, with active choroidal neovascularization: Secondary | ICD-10-CM

## 2018-11-04 ENCOUNTER — Telehealth: Payer: Self-pay | Admitting: Pharmacist

## 2018-11-04 NOTE — Telephone Encounter (Signed)
Called patient to cancel HTN clinic appointment. Patient states his blood pressure has been running between 127-134/64-74 with a HR between 54-62. States he feels well. Currently golfing. Patient home meter has not been calibrated. Will reschedule patient in office once able.

## 2018-11-08 ENCOUNTER — Ambulatory Visit: Payer: Medicare Other

## 2018-11-24 ENCOUNTER — Encounter (INDEPENDENT_AMBULATORY_CARE_PROVIDER_SITE_OTHER): Payer: Medicare Other | Admitting: Ophthalmology

## 2018-11-24 ENCOUNTER — Other Ambulatory Visit: Payer: Self-pay

## 2018-11-24 DIAGNOSIS — H43813 Vitreous degeneration, bilateral: Secondary | ICD-10-CM

## 2018-11-24 DIAGNOSIS — H353231 Exudative age-related macular degeneration, bilateral, with active choroidal neovascularization: Secondary | ICD-10-CM | POA: Diagnosis not present

## 2018-11-25 ENCOUNTER — Encounter (INDEPENDENT_AMBULATORY_CARE_PROVIDER_SITE_OTHER): Payer: Medicare Other | Admitting: Ophthalmology

## 2018-12-01 ENCOUNTER — Other Ambulatory Visit: Payer: Self-pay

## 2018-12-01 ENCOUNTER — Encounter (INDEPENDENT_AMBULATORY_CARE_PROVIDER_SITE_OTHER): Payer: Medicare Other | Admitting: Ophthalmology

## 2018-12-01 DIAGNOSIS — H318 Other specified disorders of choroid: Secondary | ICD-10-CM

## 2019-01-03 ENCOUNTER — Other Ambulatory Visit: Payer: Self-pay

## 2019-01-03 ENCOUNTER — Encounter (INDEPENDENT_AMBULATORY_CARE_PROVIDER_SITE_OTHER): Payer: Medicare Other | Admitting: Ophthalmology

## 2019-01-03 DIAGNOSIS — H43813 Vitreous degeneration, bilateral: Secondary | ICD-10-CM | POA: Diagnosis not present

## 2019-01-03 DIAGNOSIS — H353231 Exudative age-related macular degeneration, bilateral, with active choroidal neovascularization: Secondary | ICD-10-CM | POA: Diagnosis not present

## 2019-01-05 ENCOUNTER — Encounter (INDEPENDENT_AMBULATORY_CARE_PROVIDER_SITE_OTHER): Payer: Medicare Other | Admitting: Ophthalmology

## 2019-01-05 ENCOUNTER — Other Ambulatory Visit: Payer: Self-pay

## 2019-01-05 DIAGNOSIS — H318 Other specified disorders of choroid: Secondary | ICD-10-CM

## 2019-01-31 ENCOUNTER — Telehealth: Payer: Self-pay | Admitting: *Deleted

## 2019-01-31 NOTE — Telephone Encounter (Signed)
    COVID-19 Pre-Screening Questions:  . In the past 7 to 10 days have you had a cough,  shortness of breath, headache, congestion, fever (100 or greater) body aches, chills, sore throat, or sudden loss of taste or sense of smell? No . Have you been around anyone with known Covid 19. No . Have you been around anyone who is awaiting Covid 19 test results in the past 7 to 10 days? No . Have you been around anyone who has been exposed to Covid 19, or has mentioned symptoms of Covid 19 within the past 7 to 10 days? No  If you have any concerns/questions about symptoms patients report during screening (either on the phone or at threshold). Contact the provider seeing the patient or DOD for further guidance.  If neither are available contact a member of the leadership team.  The patient has been made aware to wear a mask and no visitors allowed.

## 2019-02-01 ENCOUNTER — Ambulatory Visit (INDEPENDENT_AMBULATORY_CARE_PROVIDER_SITE_OTHER): Payer: Medicare Other | Admitting: Cardiovascular Disease

## 2019-02-01 ENCOUNTER — Other Ambulatory Visit: Payer: Self-pay

## 2019-02-01 VITALS — BP 148/71 | HR 59 | Temp 98.4°F | Ht 68.0 in | Wt 165.8 lb

## 2019-02-01 DIAGNOSIS — I10 Essential (primary) hypertension: Secondary | ICD-10-CM

## 2019-02-01 DIAGNOSIS — I2510 Atherosclerotic heart disease of native coronary artery without angina pectoris: Secondary | ICD-10-CM | POA: Diagnosis not present

## 2019-02-01 DIAGNOSIS — I25810 Atherosclerosis of coronary artery bypass graft(s) without angina pectoris: Secondary | ICD-10-CM | POA: Diagnosis not present

## 2019-02-01 DIAGNOSIS — I739 Peripheral vascular disease, unspecified: Secondary | ICD-10-CM | POA: Diagnosis not present

## 2019-02-01 NOTE — Patient Instructions (Signed)

## 2019-02-01 NOTE — Progress Notes (Signed)
Cardiology Office Note   Date:  02/01/2019   ID:  Micheal LuoJohn F [name], DOB September 17, 1940, MRN [id number]  PCP:  Lorenda IshiharaVaradarajan, Rupashree, MD  Cardiologist:  Dr. [name]   No chief complaint on file.     History of Present Illness: Micheal [name] is a 78 y.o. male who is here today for follow-up visit regarding peripheral arterial disease.   Patient has known history of coronary artery disease status post CABG in 2008, type 2 diabetes, hypertension and hyperlipidemia.  He is a previous smoker and quit in 2008.  He has no family history of coronary artery disease. 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  He worked in [employer] all his life. He was seen earlier this year for bilateral leg pain that starts in the thigh area and goes down. He thought that statins might be causing this but there was no significant change in his symptoms after holding the medication.  He has no rest pain or lower extremity ulceration. He underwent lower extremity arterial Doppler which showed an ABI of 1.07 on the right and 0.84 on the left.  Given significant limitations with symptoms, I proceeded with an abdominal aortogram with lower extremity angiography in February which showed no significant aortoiliac disease.  On the right side, there was mild SFA disease with two-vessel runoff below the knee.  On the left side, there was mild SFA disease with one-vessel runoff below the knee via the peroneal artery.  These findings could not explain his symptoms especially that he does not complain of calf or foot claudication. He denies chest pain or shortness of breath.   Past Medical History:  Diagnosis Date  . CAD (coronary artery disease)    with Cabg 2008  . Claudication (HCC)   . Diabetes (HCC)   . HTN (hypertension)   . Hypercholesteremia   . Hyperlipidemia     Past Surgical History:  Procedure Laterality Date  . ABDOMINAL AORTOGRAM W/LOWER EXTREMITY N/A 09/22/2018   Procedure: ABDOMINAL  AORTOGRAM W/LOWER EXTREMITY;  Surgeon: Iran OuchArida, Venola Castello A, MD;  Location: [facility];  Service: Cardiovascular;  Laterality: N/A;  . Coronary bypass surgery     2008     Current Outpatient Medications  Medication Sig Dispense Refill  . cholecalciferol (VITAMIN D3) 25 MCG (1000 UT) tablet Take 1,000 Units by mouth daily.    . Coenzyme Q10 (CO Q-10) 200 MG CAPS Take 200 mg by mouth daily.     . Cyanocobalamin (VITAMIN B 12 PO) Take 1,000 mcg by mouth daily.     . dorzolamide (TRUSOPT) 2 % ophthalmic solution Place 1 drop into both eyes 2 (two) times daily.    . Glucosamine-Chondroitin (GLUCOSAMINE CHONDR COMPLEX PO) Take 1 tablet by mouth daily.    Marland Kitchen. losartan-hydrochlorothiazide (HYZAAR) 50-12.5 MG tablet Take 1 tablet by mouth daily. 30 tablet 11  . metFORMIN (GLUCOPHAGE) 1000 MG tablet Take 1,000 mg by mouth at bedtime.     . metoprolol succinate (TOPROL-XL) 50 MG 24 hr tablet Take 1 tablet (50 mg total) by mouth daily. 90 tablet 3  . Multiple Vitamins-Minerals (PRESERVISION AREDS 2 PO) Take 1 capsule by mouth 2 (two) times daily.    . rosuvastatin (CRESTOR) 20 MG tablet Take 1 tablet (20 mg total) by mouth daily. 30 tablet 11  . vitamin E 400 UNIT capsule Take 400 Units by mouth daily.     No current facility-administered medications for this visit.  Allergies:   Patient has no known allergies.    Social History:  The patient  reports that he has quit smoking. He has never used smokeless tobacco. He reports current alcohol use of about 1.0 standard drinks of alcohol per week. He reports that he does not use drugs.   Family History:  The patient's family history includes Healthy in his sister, sister, and sister; Heart attack in his maternal grandfather; Heart disease in his father.    ROS:  Please see the history of present illness.   Otherwise, review of systems are positive for none.   All other systems are reviewed and negative.    PHYSICAL EXAM: VS:  BP (!) 148/71    Pulse (!) 59   Temp 98.4 F (36.9 C)   Ht 5\' 8"  (1.727 m)   Wt 165 lb 12.8 oz (75.2 kg)   BMI 25.21 kg/m  , BMI Body mass index is 25.21 kg/m. GEN: Well nourished, well developed, in no acute distress  HEENT: normal  Neck: no JVD, carotid bruits, or masses Cardiac: RRR; no murmurs, rubs, or gallops,no edema  Respiratory:  clear to auscultation bilaterally, normal work of breathing GI: soft, nontender, nondistended, + BS MS: no deformity or atrophy  Skin: warm and dry, no rash Neuro:  Strength and sensation are intact Psych: euthymic mood, full affect Vascular: Femoral pulses slightly diminished bilaterally.  Dorsalis pedis is normal on the right side and not palpable on the left.  Posterior tibial is not palpable. No groin hematoma.   EKG:  EKG is not ordered today.    Recent Labs: 09/14/2018: Hemoglobin 14.3; Platelets 192 09/24/2018: ALT 21; BUN 13; Creatinine, Ser 0.99; Potassium 4.7; Sodium 140    Lipid Panel    Component Value Date/Time   CHOL 108 09/24/2018 0902   TRIG 106 09/24/2018 0902   HDL 50 09/24/2018 0902   CHOLHDL 2.2 09/24/2018 0902   CHOLHDL 3.4 04/30/2016 0928   VLDL 32 (H) 04/30/2016 0928   LDLCALC 37 09/24/2018 0902      Wt Readings from Last 3 Encounters:  02/01/19 165 lb 12.8 oz (75.2 kg)  09/22/18 171 lb (77.6 kg)  09/14/18 172 lb 3.2 oz (78.1 kg)      No flowsheet data found.    ASSESSMENT AND PLAN:  1.  Peripheral arterial disease with atypical leg pain: His angiography showed no significant aortoiliac disease to explain his thigh discomfort with walking.  He does have below the knee disease but he denies calf or foot claudication.  Thus, I recommend continuing medical therapy.  2.  Coronary artery disease involving native coronary arteries without angina: Continue medical therapy.  3.  Hyperlipidemia: Currently on rosuvastatin 20 mg daily.  Previous LDL was 61.  4.  Essential hypertension: Blood pressures controlled on current  medications.    Disposition:   FU with me in 1 year  Signed,  Kathlyn Sacramento, MD  02/01/2019 10:20 AM    [place]

## 2019-02-07 ENCOUNTER — Other Ambulatory Visit: Payer: Self-pay

## 2019-02-07 ENCOUNTER — Encounter (INDEPENDENT_AMBULATORY_CARE_PROVIDER_SITE_OTHER): Payer: Medicare Other | Admitting: Ophthalmology

## 2019-02-07 DIAGNOSIS — H43813 Vitreous degeneration, bilateral: Secondary | ICD-10-CM

## 2019-02-07 DIAGNOSIS — H353231 Exudative age-related macular degeneration, bilateral, with active choroidal neovascularization: Secondary | ICD-10-CM | POA: Diagnosis not present

## 2019-02-11 ENCOUNTER — Encounter (INDEPENDENT_AMBULATORY_CARE_PROVIDER_SITE_OTHER): Payer: Medicare Other | Admitting: Ophthalmology

## 2019-02-11 ENCOUNTER — Other Ambulatory Visit: Payer: Self-pay

## 2019-02-11 DIAGNOSIS — H318 Other specified disorders of choroid: Secondary | ICD-10-CM | POA: Diagnosis not present

## 2019-03-21 ENCOUNTER — Other Ambulatory Visit: Payer: Self-pay

## 2019-03-21 ENCOUNTER — Encounter (INDEPENDENT_AMBULATORY_CARE_PROVIDER_SITE_OTHER): Payer: Medicare Other | Admitting: Ophthalmology

## 2019-03-21 DIAGNOSIS — H43813 Vitreous degeneration, bilateral: Secondary | ICD-10-CM

## 2019-03-21 DIAGNOSIS — H353231 Exudative age-related macular degeneration, bilateral, with active choroidal neovascularization: Secondary | ICD-10-CM | POA: Diagnosis not present

## 2019-03-22 ENCOUNTER — Other Ambulatory Visit: Payer: Self-pay

## 2019-03-22 ENCOUNTER — Encounter (INDEPENDENT_AMBULATORY_CARE_PROVIDER_SITE_OTHER): Payer: Medicare Other | Admitting: Ophthalmology

## 2019-03-22 DIAGNOSIS — H353211 Exudative age-related macular degeneration, right eye, with active choroidal neovascularization: Secondary | ICD-10-CM

## 2019-03-25 ENCOUNTER — Encounter (INDEPENDENT_AMBULATORY_CARE_PROVIDER_SITE_OTHER): Payer: Medicare Other | Admitting: Ophthalmology

## 2019-04-11 DIAGNOSIS — Z23 Encounter for immunization: Secondary | ICD-10-CM | POA: Diagnosis not present

## 2019-05-09 ENCOUNTER — Other Ambulatory Visit: Payer: Self-pay

## 2019-05-09 ENCOUNTER — Encounter (INDEPENDENT_AMBULATORY_CARE_PROVIDER_SITE_OTHER): Payer: Medicare Other | Admitting: Ophthalmology

## 2019-05-09 DIAGNOSIS — H43813 Vitreous degeneration, bilateral: Secondary | ICD-10-CM | POA: Diagnosis not present

## 2019-05-09 DIAGNOSIS — H353231 Exudative age-related macular degeneration, bilateral, with active choroidal neovascularization: Secondary | ICD-10-CM

## 2019-05-10 ENCOUNTER — Encounter (INDEPENDENT_AMBULATORY_CARE_PROVIDER_SITE_OTHER): Payer: Medicare Other | Admitting: Ophthalmology

## 2019-05-10 DIAGNOSIS — H353211 Exudative age-related macular degeneration, right eye, with active choroidal neovascularization: Secondary | ICD-10-CM | POA: Diagnosis not present

## 2019-06-22 ENCOUNTER — Other Ambulatory Visit: Payer: Self-pay | Admitting: Interventional Cardiology

## 2019-06-27 ENCOUNTER — Encounter (INDEPENDENT_AMBULATORY_CARE_PROVIDER_SITE_OTHER): Payer: Medicare Other | Admitting: Ophthalmology

## 2019-06-27 DIAGNOSIS — H353231 Exudative age-related macular degeneration, bilateral, with active choroidal neovascularization: Secondary | ICD-10-CM | POA: Diagnosis not present

## 2019-06-27 DIAGNOSIS — H43813 Vitreous degeneration, bilateral: Secondary | ICD-10-CM | POA: Diagnosis not present

## 2019-06-28 ENCOUNTER — Other Ambulatory Visit: Payer: Self-pay

## 2019-06-28 ENCOUNTER — Encounter (INDEPENDENT_AMBULATORY_CARE_PROVIDER_SITE_OTHER): Payer: Medicare Other | Admitting: Ophthalmology

## 2019-06-28 DIAGNOSIS — H353221 Exudative age-related macular degeneration, left eye, with active choroidal neovascularization: Secondary | ICD-10-CM

## 2019-08-16 ENCOUNTER — Other Ambulatory Visit: Payer: Self-pay

## 2019-08-16 ENCOUNTER — Encounter (INDEPENDENT_AMBULATORY_CARE_PROVIDER_SITE_OTHER): Payer: Medicare Other | Admitting: Ophthalmology

## 2019-08-16 DIAGNOSIS — H353231 Exudative age-related macular degeneration, bilateral, with active choroidal neovascularization: Secondary | ICD-10-CM | POA: Diagnosis not present

## 2019-08-16 DIAGNOSIS — H43813 Vitreous degeneration, bilateral: Secondary | ICD-10-CM | POA: Diagnosis not present

## 2019-08-17 ENCOUNTER — Other Ambulatory Visit: Payer: Self-pay | Admitting: Interventional Cardiology

## 2019-08-17 ENCOUNTER — Encounter (INDEPENDENT_AMBULATORY_CARE_PROVIDER_SITE_OTHER): Payer: Medicare Other | Admitting: Ophthalmology

## 2019-08-17 DIAGNOSIS — H318 Other specified disorders of choroid: Secondary | ICD-10-CM

## 2019-09-18 ENCOUNTER — Ambulatory Visit: Payer: Medicare Other | Attending: Internal Medicine

## 2019-09-18 DIAGNOSIS — Z23 Encounter for immunization: Secondary | ICD-10-CM | POA: Insufficient documentation

## 2019-09-18 NOTE — Progress Notes (Signed)
   Covid-19 Vaccination Clinic  Name:  RAMIL EDGINGTON    MRN: [id number] DOB: 06-12-1941  09/18/2019  Mr. Escamilla was observed post Covid-19 immunization for 15 minutes without incidence. He was provided with Vaccine Information Sheet and instruction to access the V-Safe system.   Mr. Bublitz was instructed to call 911 with any severe reactions post vaccine: Marland Kitchen Difficulty breathing  . Swelling of your face and throat  . A fast heartbeat  . A bad rash all over your body  . Dizziness and weakness    Immunizations Administered    Name Date Dose VIS Date Route   Pfizer COVID-19 Vaccine 09/18/2019  2:40 PM 0.3 mL 07/08/2019 Intramuscular   Manufacturer: [employer], [employer]   Lot: [id number]   NDC: [address]-9

## 2019-10-03 ENCOUNTER — Encounter (INDEPENDENT_AMBULATORY_CARE_PROVIDER_SITE_OTHER): Payer: Medicare Other | Admitting: Ophthalmology

## 2019-10-03 ENCOUNTER — Other Ambulatory Visit: Payer: Self-pay

## 2019-10-03 DIAGNOSIS — H353231 Exudative age-related macular degeneration, bilateral, with active choroidal neovascularization: Secondary | ICD-10-CM

## 2019-10-03 DIAGNOSIS — H43813 Vitreous degeneration, bilateral: Secondary | ICD-10-CM

## 2019-10-03 NOTE — Progress Notes (Signed)
Cardiology Office Note:    Date:  10/04/2019   ID:  Micheal [name], DOB 1940/09/19, MRN [id number]  PCP:  [name], MD  Cardiologist:  Sinclair Grooms, MD   Referring MD: [name],*   Chief Complaint  Patient presents with  . Hypertension    History of Present Illness:    Micheal [name] is a 79 y.o. male with a hx of  CADwith prior CABG back in 2008, DM,hyperlipidemia, hypertension, and exertional lower extremity.  There is concerned about the blood pressure.  There has been any emergency room visit due to sporadically elevated blood pressures.  There is no chest pain, fluid accumulation, orthopnea, syncope, or prolonged palpitations.  The patient does not give much history.  The daughter has data and does most of the talking.  They are concerned about recent changes and additions to the medication regimen which is included metoprolol tartrate 25 mg twice daily and the addition of HCTZ 12.5 mg/day.  Patient was already on lisinopril.  Past Medical History:  Diagnosis Date  . CAD (coronary artery disease)    with Cabg 2008  . Claudication ([place])   . Diabetes ([place])   . HTN (hypertension)   . Hypercholesteremia   . Hyperlipidemia     Past Surgical History:  Procedure Laterality Date  . ABDOMINAL AORTOGRAM W/LOWER EXTREMITY N/A 09/22/2018   Procedure: ABDOMINAL AORTOGRAM W/LOWER EXTREMITY;  Surgeon: [name], MD;  Location: [place] [facility];  Service: Cardiovascular;  Laterality: N/A;  . Coronary bypass surgery     2008    Current Medications: Current Meds  Medication Sig  . cholecalciferol (VITAMIN D3) 25 MCG (1000 UT) tablet Take 1,000 Units by mouth daily.  . Coenzyme Q10 (CO Q-10) 200 MG CAPS Take 200 mg by mouth daily.   . Cyanocobalamin (VITAMIN B 12 PO) Take 1,000 mcg by mouth daily.   . dorzolamide (TRUSOPT) 2 % ophthalmic solution Place 1 drop into both eyes 2 (two) times daily.  . Glucosamine-Chondroitin (GLUCOSAMINE  CHONDR COMPLEX PO) Take 1 tablet by mouth daily.  Marland Kitchen losartan-hydrochlorothiazide (HYZAAR) 50-12.5 MG tablet TAKE 1 TABLET BY MOUTH EVERY DAY  . metFORMIN (GLUCOPHAGE) 1000 MG tablet Take 1,000 mg by mouth at bedtime.   . metoprolol succinate (TOPROL-XL) 50 MG 24 hr tablet TAKE 1 TABLET BY MOUTH EVERY DAY  . Multiple Vitamins-Minerals (PRESERVISION AREDS 2 PO) Take 1 capsule by mouth 2 (two) times daily.  . rosuvastatin (CRESTOR) 20 MG tablet TAKE 1 TABLET BY MOUTH EVERY DAY  . vitamin E 400 UNIT capsule Take 400 Units by mouth daily.     Allergies:   Patient has no known allergies.   Social History   Socioeconomic History  . Marital status: Married    Spouse name: Not on file  . Number of children: Not on file  . Years of education: Not on file  . Highest education level: Not on file  Occupational History  . Not on file  Tobacco Use  . Smoking status: Former Research scientist (life sciences)  . Smokeless tobacco: Never Used  Substance and Sexual Activity  . Alcohol use: Yes    Alcohol/week: 1.0 standard drinks    Types: 1 Glasses of wine per week  . Drug use: No  . Sexual activity: Yes  Other Topics Concern  . Not on file  Social History Narrative  . Not on file   Social Determinants of Health   Financial Resource Strain:   . Difficulty of Paying  Living Expenses: Not on file  Food Insecurity:   . Worried About Charity fundraiser in the Last Year: Not on file  . Ran Out of Food in the Last Year: Not on file  Transportation Needs:   . Lack of Transportation (Medical): Not on file  . Lack of Transportation (Non-Medical): Not on file  Physical Activity:   . Days of Exercise per Week: Not on file  . Minutes of Exercise per Session: Not on file  Stress:   . Feeling of Stress : Not on file  Social Connections:   . Frequency of Communication with Friends and Family: Not on file  . Frequency of Social Gatherings with Friends and Family: Not on file  . Attends Religious Services: Not on file  .  Active Member of Clubs or Organizations: Not on file  . Attends Archivist Meetings: Not on file  . Marital Status: Not on file     Family History: The patient's family history includes Healthy in his sister, sister, and sister; Heart attack in his maternal grandfather; Heart disease in his father.  ROS:   Please see the history of present illness.    Patient seems somewhat distant.  She does not complain very much.  Just says that she does not know how she is doing.  She voices no specific complaints.  All other systems reviewed and are negative.  EKGs/Labs/Other Studies Reviewed:    The following studies were reviewed today: No new data  EKG:  EKG performed today demonstrates normal sinus rhythm, low P wave amplitude, nonspecific ST abnormality, and premature ventricular contraction.  Recent Labs: No results found for requested labs within last 8760 hours.  Recent Lipid Panel    Component Value Date/Time   CHOL 108 09/24/2018 0902   TRIG 106 09/24/2018 0902   HDL 50 09/24/2018 0902   CHOLHDL 2.2 09/24/2018 0902   CHOLHDL 3.4 04/30/2016 0928   VLDL 32 (H) 04/30/2016 0928   LDLCALC 37 09/24/2018 0902    Physical Exam:    VS:  BP 134/68   Pulse (!) 58   Ht '5\' 8"'$  (1.727 m)   Wt 166 lb 1.9 oz (75.4 kg)   SpO2 100%   BMI 25.26 kg/m     Wt Readings from Last 3 Encounters:  10/04/19 166 lb 1.9 oz (75.4 kg)  02/01/19 165 lb 12.8 oz (75.2 kg)  09/22/18 171 lb (77.6 kg)     GEN: Appears younger than stated age. No acute distress HEENT: Normal NECK: No JVD. LYMPHATICS: No lymphadenopathy CARDIAC:  RRR without murmur, gallop, or edema. VASCULAR:  Normal Pulses. No bruits. RESPIRATORY:  Clear to auscultation without rales, wheezing or rhonchi  ABDOMEN: Soft, non-tender, non-distended, No pulsatile mass, MUSCULOSKELETAL: No deformity  SKIN: Warm and dry NEUROLOGIC:  Alert and oriented x 3 PSYCHIATRIC:  Normal affect   ASSESSMENT:    1. Coronary artery  disease involving native coronary artery of native heart without angina pectoris   2. Essential hypertension   3. Mixed hyperlipidemia   4. Diabetes mellitus due to underlying condition with hyperosmolarity without coma, without long-term current use of insulin ([place])   5. Claudication of both lower extremities ([place])   6. Educated about COVID-19 virus infection    PLAN:    In order of problems listed above:  1. Secondary prevention covered.  Please see below. 2. Blood pressure is adequately controlled.  Cut back on alcohol and salt. 3. LDL cholesterol is followed by primary  care.  We have no data since 2018.  We will therefore draw a hemoglobin A1c, lipid panel, and c-Met. 4. Hemoglobin A1c should be less than 7.  An A1c is obtained today. 5. Aerobic activity, achieving greater than 150 minutes of moderate activity per week is encouraged. 6. He has gotten vaccinated for COVID-19.  3W's is still being practiced.   Overall education and awareness concerning primary/secondary risk prevention was discussed in detail: LDL less than 70, hemoglobin A1c less than 7, blood pressure target less than 130/80 mmHg, >150 minutes of moderate aerobic activity per week, avoidance of smoking, weight control (via diet and exercise), and continued surveillance/management of/for obstructive sleep apnea.    Medication Adjustments/Labs and Tests Ordered: Current medicines are reviewed at length with the patient today.  Concerns regarding medicines are outlined above.  Orders Placed This Encounter  Procedures  . Basic metabolic panel  . Hepatic function panel  . Lipid panel  . HgB A1c  . CBC  . EKG 12-Lead   No orders of the defined types were placed in this encounter.   Patient Instructions  Medication Instructions:  Your physician recommends that you continue on your current medications as directed. Please refer to the Current Medication list given to you today.  *If you need a refill on your  cardiac medications before your next appointment, please call your pharmacy*   Lab Work: BMET, Liver, Lipid, CBC, and A1C when fasting (nothing to eat or drink after midnight except water and black coffee).  If you have labs (blood work) drawn today and your tests are completely normal, you will receive your results only by: Marland Kitchen MyChart Message (if you have MyChart) OR . A paper copy in the mail If you have any lab test that is abnormal or we need to change your treatment, we will call you to review the results.   Testing/Procedures: None   Follow-Up: At [facility], you and your health needs are our priority.  As part of our continuing mission to provide you with exceptional heart care, we have created designated Provider Care Teams.  These Care Teams include your primary Cardiologist (physician) and Advanced Practice Providers (APPs -  Physician Assistants and Nurse Practitioners) who all work together to provide you with the care you need, when you need it.  We recommend signing up for the patient portal called "MyChart".  Sign up information is provided on this After Visit Summary.  MyChart is used to connect with patients for Virtual Visits (Telemedicine).  Patients are able to view lab/test results, encounter notes, upcoming appointments, etc.  Non-urgent messages can be sent to your provider as well.   To learn more about what you can do with MyChart, go to [email or web address].    Your next appointment:   12 month(s)  The format for your next appointment:   In Person  Provider:   You may see Sinclair Grooms, MD or one of the following Advanced Practice Providers on your designated Care Team:    Truitt Merle, NP  Cecilie Kicks, NP  Kathyrn Drown, NP    Other Instructions      Signed, Sinclair Grooms, MD  10/04/2019 5:13 PM    [place]

## 2019-10-04 ENCOUNTER — Ambulatory Visit (INDEPENDENT_AMBULATORY_CARE_PROVIDER_SITE_OTHER): Payer: Medicare Other | Admitting: Interventional Cardiology

## 2019-10-04 ENCOUNTER — Encounter (INDEPENDENT_AMBULATORY_CARE_PROVIDER_SITE_OTHER): Payer: Medicare Other | Admitting: Ophthalmology

## 2019-10-04 ENCOUNTER — Encounter: Payer: Self-pay | Admitting: Interventional Cardiology

## 2019-10-04 VITALS — BP 134/68 | HR 58 | Ht 68.0 in | Wt 166.1 lb

## 2019-10-04 DIAGNOSIS — Z7189 Other specified counseling: Secondary | ICD-10-CM

## 2019-10-04 DIAGNOSIS — I2510 Atherosclerotic heart disease of native coronary artery without angina pectoris: Secondary | ICD-10-CM

## 2019-10-04 DIAGNOSIS — I739 Peripheral vascular disease, unspecified: Secondary | ICD-10-CM

## 2019-10-04 DIAGNOSIS — I10 Essential (primary) hypertension: Secondary | ICD-10-CM | POA: Diagnosis not present

## 2019-10-04 DIAGNOSIS — H353221 Exudative age-related macular degeneration, left eye, with active choroidal neovascularization: Secondary | ICD-10-CM | POA: Diagnosis not present

## 2019-10-04 DIAGNOSIS — E0800 Diabetes mellitus due to underlying condition with hyperosmolarity without nonketotic hyperglycemic-hyperosmolar coma (NKHHC): Secondary | ICD-10-CM | POA: Diagnosis not present

## 2019-10-04 DIAGNOSIS — E782 Mixed hyperlipidemia: Secondary | ICD-10-CM | POA: Diagnosis not present

## 2019-10-04 NOTE — Patient Instructions (Signed)
Medication Instructions:  Your physician recommends that you continue on your current medications as directed. Please refer to the Current Medication list given to you today.  *If you need a refill on your cardiac medications before your next appointment, please call your pharmacy*   Lab Work: BMET, Liver, Lipid, CBC, and A1C when fasting (nothing to eat or drink after midnight except water and black coffee).  If you have labs (blood work) drawn today and your tests are completely normal, you will receive your results only by: Marland Kitchen MyChart Message (if you have MyChart) OR . A paper copy in the mail If you have any lab test that is abnormal or we need to change your treatment, we will call you to review the results.   Testing/Procedures: None   Follow-Up: At [facility], you and your health needs are our priority.  As part of our continuing mission to provide you with exceptional heart care, we have created designated Provider Care Teams.  These Care Teams include your primary Cardiologist (physician) and Advanced Practice Providers (APPs -  Physician Assistants and Nurse Practitioners) who all work together to provide you with the care you need, when you need it.  We recommend signing up for the patient portal called "MyChart".  Sign up information is provided on this After Visit Summary.  MyChart is used to connect with patients for Virtual Visits (Telemedicine).  Patients are able to view lab/test results, encounter notes, upcoming appointments, etc.  Non-urgent messages can be sent to your provider as well.   To learn more about what you can do with MyChart, go to [email or web address].    Your next appointment:   12 month(s)  The format for your next appointment:   In Person  Provider:   You may see Lesleigh Noe, MD or one of the following Advanced Practice Providers on your designated Care Team:    Norma Fredrickson, NP  Nada Boozer, NP  Georgie Chard, NP    Other  Instructions

## 2019-10-10 ENCOUNTER — Other Ambulatory Visit: Payer: Medicare Other | Admitting: *Deleted

## 2019-10-10 ENCOUNTER — Other Ambulatory Visit: Payer: Self-pay

## 2019-10-10 DIAGNOSIS — E782 Mixed hyperlipidemia: Secondary | ICD-10-CM

## 2019-10-10 DIAGNOSIS — E0800 Diabetes mellitus due to underlying condition with hyperosmolarity without nonketotic hyperglycemic-hyperosmolar coma (NKHHC): Secondary | ICD-10-CM | POA: Diagnosis not present

## 2019-10-10 DIAGNOSIS — I739 Peripheral vascular disease, unspecified: Secondary | ICD-10-CM | POA: Diagnosis not present

## 2019-10-10 DIAGNOSIS — I2510 Atherosclerotic heart disease of native coronary artery without angina pectoris: Secondary | ICD-10-CM

## 2019-10-10 DIAGNOSIS — I10 Essential (primary) hypertension: Secondary | ICD-10-CM

## 2019-10-10 LAB — BASIC METABOLIC PANEL
BUN/Creatinine Ratio: 16 (ref 10–24)
BUN: 17 mg/dL (ref 8–27)
CO2: 25 mmol/L (ref 20–29)
Calcium: 8.7 mg/dL (ref 8.6–10.2)
Chloride: 104 mmol/L (ref 96–106)
Creatinine, Ser: 1.08 mg/dL (ref 0.76–1.27)
GFR calc Af Amer: 75 mL/min/{1.73_m2} (ref >59)
GFR calc non Af Amer: 65 mL/min/{1.73_m2} (ref >59)
Glucose: 156 mg/dL — ABNORMAL HIGH (ref 65–99)
Potassium: 4.4 mmol/L (ref 3.5–5.2)
Sodium: 142 mmol/L (ref 134–144)

## 2019-10-10 LAB — HEPATIC FUNCTION PANEL
ALT: 20 IU/L (ref 0–44)
AST: 19 IU/L (ref 0–40)
Albumin: 4.3 g/dL (ref 3.7–4.7)
Alkaline Phosphatase: 65 IU/L (ref 39–117)
Bilirubin Total: 1.1 mg/dL (ref 0.0–1.2)
Bilirubin, Direct: 0.36 mg/dL (ref 0.00–0.40)
Total Protein: 6.5 g/dL (ref 6.0–8.5)

## 2019-10-10 LAB — CBC
Hematocrit: 38 % (ref 37.5–51.0)
Hemoglobin: 12.6 g/dL — ABNORMAL LOW (ref 13.0–17.7)
MCH: 32.3 pg (ref 26.6–33.0)
MCHC: 33.2 g/dL (ref 31.5–35.7)
MCV: 97 fL (ref 79–97)
Platelets: 151 10*3/uL (ref 150–450)
RBC: 3.9 x10E6/uL — ABNORMAL LOW (ref 4.14–5.80)
RDW: 13.4 % (ref 11.6–15.4)
WBC: 5.6 10*3/uL (ref 3.4–10.8)

## 2019-10-10 LAB — LIPID PANEL
Chol/HDL Ratio: 2 ratio (ref 0.0–5.0)
Cholesterol, Total: 86 mg/dL — ABNORMAL LOW (ref 100–199)
HDL: 44 mg/dL (ref >39)
LDL Chol Calc (NIH): 24 mg/dL (ref 0–99)
Triglycerides: 95 mg/dL (ref 0–149)
VLDL Cholesterol Cal: 18 mg/dL (ref 5–40)

## 2019-10-10 LAB — HEMOGLOBIN A1C
Est. average glucose Bld gHb Est-mCnc: 148 mg/dL
Hgb A1c MFr Bld: 6.8 % — ABNORMAL HIGH (ref 4.8–5.6)

## 2019-10-12 ENCOUNTER — Ambulatory Visit: Payer: Medicare Other | Attending: Internal Medicine

## 2019-10-12 DIAGNOSIS — Z23 Encounter for immunization: Secondary | ICD-10-CM

## 2019-10-12 NOTE — Progress Notes (Signed)
   Covid-19 Vaccination Clinic  Name:  CAMREN LIPSETT    MRN: [id number] DOB: 12-10-1940  10/12/2019  Mr. Jerger was observed post Covid-19 immunization for 15 minutes without incident. He was provided with Vaccine Information Sheet and instruction to access the V-Safe system.   Mr. Hemphill was instructed to call 911 with any severe reactions post vaccine: Marland Kitchen Difficulty breathing  . Swelling of face and throat  . A fast heartbeat  . A bad rash all over body  . Dizziness and weakness   Immunizations Administered    Name Date Dose VIS Date Route   Pfizer COVID-19 Vaccine 10/12/2019  2:45 PM 0.3 mL 07/08/2019 Intramuscular   Manufacturer: [employer], [employer]   Lot: [id number]   NDC: [address]-7

## 2019-11-28 ENCOUNTER — Other Ambulatory Visit: Payer: Self-pay

## 2019-11-28 ENCOUNTER — Encounter (INDEPENDENT_AMBULATORY_CARE_PROVIDER_SITE_OTHER): Payer: Medicare Other | Admitting: Ophthalmology

## 2019-11-28 DIAGNOSIS — H353231 Exudative age-related macular degeneration, bilateral, with active choroidal neovascularization: Secondary | ICD-10-CM

## 2019-11-28 DIAGNOSIS — H43813 Vitreous degeneration, bilateral: Secondary | ICD-10-CM | POA: Diagnosis not present

## 2019-11-29 ENCOUNTER — Encounter (INDEPENDENT_AMBULATORY_CARE_PROVIDER_SITE_OTHER): Payer: Medicare Other | Admitting: Ophthalmology

## 2019-11-29 DIAGNOSIS — H353221 Exudative age-related macular degeneration, left eye, with active choroidal neovascularization: Secondary | ICD-10-CM

## 2020-01-23 ENCOUNTER — Other Ambulatory Visit: Payer: Self-pay

## 2020-01-23 ENCOUNTER — Encounter (INDEPENDENT_AMBULATORY_CARE_PROVIDER_SITE_OTHER): Payer: Medicare Other | Admitting: Ophthalmology

## 2020-01-23 DIAGNOSIS — H353231 Exudative age-related macular degeneration, bilateral, with active choroidal neovascularization: Secondary | ICD-10-CM

## 2020-01-23 DIAGNOSIS — H43813 Vitreous degeneration, bilateral: Secondary | ICD-10-CM | POA: Diagnosis not present

## 2020-01-24 ENCOUNTER — Encounter (INDEPENDENT_AMBULATORY_CARE_PROVIDER_SITE_OTHER): Payer: Medicare Other | Admitting: Ophthalmology

## 2020-01-24 DIAGNOSIS — H353221 Exudative age-related macular degeneration, left eye, with active choroidal neovascularization: Secondary | ICD-10-CM | POA: Diagnosis not present

## 2020-01-25 DIAGNOSIS — H401131 Primary open-angle glaucoma, bilateral, mild stage: Secondary | ICD-10-CM | POA: Diagnosis not present

## 2020-01-25 DIAGNOSIS — I25810 Atherosclerosis of coronary artery bypass graft(s) without angina pectoris: Secondary | ICD-10-CM | POA: Diagnosis not present

## 2020-01-25 DIAGNOSIS — R634 Abnormal weight loss: Secondary | ICD-10-CM | POA: Diagnosis not present

## 2020-01-25 DIAGNOSIS — Z0000 Encounter for general adult medical examination without abnormal findings: Secondary | ICD-10-CM | POA: Diagnosis not present

## 2020-01-25 DIAGNOSIS — Z1389 Encounter for screening for other disorder: Secondary | ICD-10-CM | POA: Diagnosis not present

## 2020-01-25 DIAGNOSIS — E1169 Type 2 diabetes mellitus with other specified complication: Secondary | ICD-10-CM | POA: Diagnosis not present

## 2020-01-25 DIAGNOSIS — I10 Essential (primary) hypertension: Secondary | ICD-10-CM | POA: Diagnosis not present

## 2020-01-26 ENCOUNTER — Telehealth: Payer: Self-pay | Admitting: *Deleted

## 2020-01-26 NOTE — Telephone Encounter (Signed)
A message was left, re: his follow up visit. 

## 2020-02-08 ENCOUNTER — Other Ambulatory Visit: Payer: Self-pay | Admitting: Interventional Cardiology

## 2020-02-13 DIAGNOSIS — H401113 Primary open-angle glaucoma, right eye, severe stage: Secondary | ICD-10-CM | POA: Diagnosis not present

## 2020-02-28 ENCOUNTER — Other Ambulatory Visit: Payer: Self-pay

## 2020-02-28 ENCOUNTER — Encounter: Payer: Self-pay | Admitting: Cardiovascular Disease

## 2020-02-28 ENCOUNTER — Ambulatory Visit (INDEPENDENT_AMBULATORY_CARE_PROVIDER_SITE_OTHER): Payer: Medicare Other | Admitting: Cardiovascular Disease

## 2020-02-28 VITALS — BP 167/67 | HR 54 | Ht 69.0 in | Wt 165.0 lb

## 2020-02-28 DIAGNOSIS — E785 Hyperlipidemia, unspecified: Secondary | ICD-10-CM

## 2020-02-28 DIAGNOSIS — I739 Peripheral vascular disease, unspecified: Secondary | ICD-10-CM | POA: Diagnosis not present

## 2020-02-28 DIAGNOSIS — I2510 Atherosclerotic heart disease of native coronary artery without angina pectoris: Secondary | ICD-10-CM

## 2020-02-28 DIAGNOSIS — I10 Essential (primary) hypertension: Secondary | ICD-10-CM

## 2020-02-28 NOTE — Patient Instructions (Signed)

## 2020-02-28 NOTE — Progress Notes (Signed)
Cardiology Office Note   Date:  02/29/2020   ID:  Micheal [name], DOB 05-17-1941, MRN [id number]  PCP:  Lorenda Ishihara, MD  Cardiologist:  Dr. Katrinka Blazing   No chief complaint on file.     History of Present Illness: Micheal [name] is a 79 y.o. male who is here today for follow-up visit regarding peripheral arterial disease.   Patient has known history of coronary artery disease status post CABG in 2008, type 2 diabetes, hypertension and hyperlipidemia.  He is a previous smoker and quit in 2008.  He has no family history of coronary artery disease. 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 He was seen in 2020 for bilateral leg pain that starts in the thigh area and goes down.  He underwent lower extremity arterial Doppler which showed an ABI of 1.07 on the right and 0.84 on the left.  Given significant limitations with symptoms, I proceeded with angiography and February 2020  which showed no significant aortoiliac disease.  On the right side, there was mild SFA disease with two-vessel runoff below the knee.  On the left side, there was mild SFA disease with one-vessel runoff below the knee via the peroneal artery.  These findings could not explain his symptoms especially that he does not complain of calf or foot claudication.  He has been doing well with no recent chest pain, shortness of breath or palpitations.  Leg symptoms are still the same and unchanged.  He has good days and bad days but continues to be active and plays golf on a regular basis.   Past Medical History:  Diagnosis Date  . CAD (coronary artery disease)    with Cabg 2008  . Claudication (HCC)   . Diabetes (HCC)   . HTN (hypertension)   . Hypercholesteremia   . Hyperlipidemia     Past Surgical History:  Procedure Laterality Date  . ABDOMINAL AORTOGRAM W/LOWER EXTREMITY N/A 09/22/2018   Procedure: ABDOMINAL AORTOGRAM W/LOWER EXTREMITY;  Surgeon: Iran Ouch, MD;  Location: [facility];  Service: Cardiovascular;  Laterality: N/A;  . Coronary bypass surgery     2008     Current Outpatient Medications  Medication Sig Dispense Refill  . cholecalciferol (VITAMIN D3) 25 MCG (1000 UT) tablet Take 1,000 Units by mouth daily.    . Coenzyme Q10 (CO Q-10) 200 MG CAPS Take 200 mg by mouth daily.     . Cyanocobalamin (VITAMIN B 12 PO) Take 1,000 mcg by mouth daily.     . dorzolamide (TRUSOPT) 2 % ophthalmic solution Place 1 drop into both eyes 2 (two) times daily.    . Glucosamine-Chondroitin (GLUCOSAMINE CHONDR COMPLEX PO) Take 1 tablet by mouth daily.    Marland Kitchen losartan-hydrochlorothiazide (HYZAAR) 50-12.5 MG tablet TAKE 1 TABLET BY MOUTH EVERY DAY 90 tablet 2  . metFORMIN (GLUCOPHAGE) 1000 MG tablet Take 1,000 mg by mouth at bedtime.     . metoprolol succinate (TOPROL-XL) 50 MG 24 hr tablet TAKE 1 TABLET BY MOUTH EVERY DAY 90 tablet 1  . Multiple Vitamins-Minerals (PRESERVISION AREDS 2 PO) Take 1 capsule by mouth 2 (two) times daily.    . pravastatin (PRAVACHOL) 20 MG tablet Take 20 mg by mouth at bedtime.    . rosuvastatin (CRESTOR) 20 MG tablet TAKE 1 TABLET BY MOUTH EVERY DAY 90 tablet 2  . vitamin E 400 UNIT capsule Take 400 Units by mouth  daily.     No current facility-administered medications for this visit.    Allergies:   Patient has no known allergies.    Social History:  The patient  reports that he has quit smoking. He has never used smokeless tobacco. He reports current alcohol use of about 1.0 standard drink of alcohol per week. He reports that he does not use drugs.   Family History:  The patient's family history includes Healthy in his sister, sister, and sister; Heart attack in his maternal grandfather; Heart disease in his father.    ROS:  Please see the history of present illness.   Otherwise, review of systems are positive for none.   All other systems are reviewed and negative.    PHYSICAL EXAM: VS:  BP (!)  167/67   Pulse (!) 54   Ht 5\' 9"  (1.753 m)   Wt 165 lb (74.8 kg)   SpO2 99%   BMI 24.37 kg/m  , BMI Body mass index is 24.37 kg/m. GEN: Well nourished, well developed, in no acute distress  HEENT: normal  Neck: no JVD, carotid bruits, or masses Cardiac: RRR; no murmurs, rubs, or gallops,no edema  Respiratory:  clear to auscultation bilaterally, normal work of breathing GI: soft, nontender, nondistended, + BS MS: no deformity or atrophy  Skin: warm and dry, no rash Neuro:  Strength and sensation are intact Psych: euthymic mood, full affect Vascular: Femoral pulse: Slightly diminished on the right and normal on the left.     EKG:  EKG is not ordered today.    Recent Labs: 10/10/2019: ALT 20; BUN 17; Creatinine, Ser 1.08; Hemoglobin 12.6; Platelets 151; Potassium 4.4; Sodium 142    Lipid Panel    Component Value Date/Time   CHOL 86 (L) 10/10/2019 0744   TRIG 95 10/10/2019 0744   HDL 44 10/10/2019 0744   CHOLHDL 2.0 10/10/2019 0744   CHOLHDL 3.4 04/30/2016 0928   VLDL 32 (H) 04/30/2016 0928   LDLCALC 24 10/10/2019 0744      Wt Readings from Last 3 Encounters:  02/28/20 165 lb (74.8 kg)  10/04/19 166 lb 1.9 oz (75.4 kg)  02/01/19 165 lb 12.8 oz (75.2 kg)      No flowsheet data found.    ASSESSMENT AND PLAN:  1.  Peripheral arterial disease with atypical leg pain:  He does have below the knee disease but he denies calf or foot claudication.  No significant change from before.  Continue medical therapy.  2.  Coronary artery disease involving native coronary arteries without angina: Continue medical therapy.  3.  Hyperlipidemia: Continue treatment with a statin with a target LDL of less than 70.  4.  Essential hypertension: Blood pressure is elevated today but this is somewhat unusual for him.  Continue to monitor for now.    Disposition:   FU with me in 1 year  Signed,  04/04/19, MD  02/29/2020 3:24 PM    [facility]

## 2020-03-10 DIAGNOSIS — Z20822 Contact with and (suspected) exposure to covid-19: Secondary | ICD-10-CM | POA: Diagnosis not present

## 2020-03-10 DIAGNOSIS — U071 COVID-19: Secondary | ICD-10-CM | POA: Diagnosis not present

## 2020-03-19 ENCOUNTER — Encounter (INDEPENDENT_AMBULATORY_CARE_PROVIDER_SITE_OTHER): Payer: Medicare Other | Admitting: Ophthalmology

## 2020-03-20 ENCOUNTER — Encounter (INDEPENDENT_AMBULATORY_CARE_PROVIDER_SITE_OTHER): Payer: Medicare Other | Admitting: Ophthalmology

## 2020-03-26 DIAGNOSIS — Z20822 Contact with and (suspected) exposure to covid-19: Secondary | ICD-10-CM | POA: Diagnosis not present

## 2020-03-26 DIAGNOSIS — Z03818 Encounter for observation for suspected exposure to other biological agents ruled out: Secondary | ICD-10-CM | POA: Diagnosis not present

## 2020-04-03 DIAGNOSIS — Z23 Encounter for immunization: Secondary | ICD-10-CM | POA: Diagnosis not present

## 2020-04-16 DIAGNOSIS — H401113 Primary open-angle glaucoma, right eye, severe stage: Secondary | ICD-10-CM | POA: Diagnosis not present

## 2020-04-24 ENCOUNTER — Other Ambulatory Visit: Payer: Self-pay

## 2020-04-24 ENCOUNTER — Encounter (INDEPENDENT_AMBULATORY_CARE_PROVIDER_SITE_OTHER): Payer: Medicare Other | Admitting: Ophthalmology

## 2020-04-24 DIAGNOSIS — I10 Essential (primary) hypertension: Secondary | ICD-10-CM

## 2020-04-24 DIAGNOSIS — H35033 Hypertensive retinopathy, bilateral: Secondary | ICD-10-CM

## 2020-04-24 DIAGNOSIS — H43813 Vitreous degeneration, bilateral: Secondary | ICD-10-CM | POA: Diagnosis not present

## 2020-04-24 DIAGNOSIS — H353231 Exudative age-related macular degeneration, bilateral, with active choroidal neovascularization: Secondary | ICD-10-CM

## 2020-04-25 ENCOUNTER — Encounter (INDEPENDENT_AMBULATORY_CARE_PROVIDER_SITE_OTHER): Payer: Medicare Other | Admitting: Ophthalmology

## 2020-04-25 DIAGNOSIS — H353221 Exudative age-related macular degeneration, left eye, with active choroidal neovascularization: Secondary | ICD-10-CM | POA: Diagnosis not present

## 2020-05-17 ENCOUNTER — Other Ambulatory Visit: Payer: Self-pay

## 2020-05-17 ENCOUNTER — Ambulatory Visit (INDEPENDENT_AMBULATORY_CARE_PROVIDER_SITE_OTHER): Payer: Medicare Other | Admitting: Podiatrist

## 2020-05-17 ENCOUNTER — Encounter: Payer: Self-pay | Admitting: Podiatrist

## 2020-05-17 DIAGNOSIS — I2510 Atherosclerotic heart disease of native coronary artery without angina pectoris: Secondary | ICD-10-CM

## 2020-05-17 DIAGNOSIS — L600 Ingrowing nail: Secondary | ICD-10-CM

## 2020-05-17 NOTE — Patient Instructions (Signed)
Soak Instructions    THE DAY AFTER THE PROCEDURE  Place 1/4 cup of epsom salts in a quart of warm tap water.  Submerge your foot or feet with outer bandage intact for the initial soak; this will allow the bandage to become moist and wet for easy lift off.  Once you remove your bandage, continue to soak in the solution for 20 minutes.  This soak should be done twice a day.  Next, remove your foot or feet from solution, blot dry the affected area and cover.  Apply polysporin or neosporin.   You may use a band aid large enough to cover the area or use gauze and tape.     1 week after the procedure OR-- IF YOUR SKIN BECOMES IRRITATED WHILE USING THESE INSTRUCTIONS, IT IS OKAY TO SWITCH TO  antibacterial soap pump soap (Dial)  and water to keep the toe clean instead of soaking in epsom salts.     Long Term Care Instructions-Post Nail Surgery  You have had your ingrown toenail and root treated with a chemical.  This chemical causes a burn that will drain and ooze like a blister.  1-2 weeks after the procedure you may leave the area open to air at night to help dry it up.  During the day,  It is important to keep this area clean and covered until the toe dries out and forms a scab. Once the scab forms you no longer need to soak or apply a dressing.  If at any time you experience an increase in pain, redness, swelling, or drainage, you should contact the office as soon as possible.

## 2020-05-24 NOTE — Progress Notes (Signed)
Chief Complaint  Patient presents with  . Ingrown Toenail    left hallux nail. PT stated the nail is painful when pressure is applied or if he wears shoes      HPI: Patient is 79 y.o. male who presents today for a painful left hallux nail which is painful with pressure and when wearing shoes on the medial side of the nail.    Patient Active Problem List   Diagnosis Date Noted  . Coronary artery disease involving coronary bypass graft of native heart without angina pectoris 02/23/2014  . Essential hypertension 02/23/2014  . CAD (coronary artery disease)   . HTN (hypertension)   . Hyperlipidemia   . Diabetes (HCC)   . Claudication [facility])     Current Outpatient Medications on File Prior to Visit  Medication Sig Dispense Refill  . cholecalciferol (VITAMIN D3) 25 MCG (1000 UT) tablet Take 1,000 Units by mouth daily.    . Coenzyme Q10 (CO Q-10) 200 MG CAPS Take 200 mg by mouth daily.     . Cyanocobalamin (VITAMIN B 12 PO) Take 1,000 mcg by mouth daily.     . dorzolamide (TRUSOPT) 2 % ophthalmic solution Place 1 drop into both eyes 2 (two) times daily.    . dorzolamide-timolol (COSOPT) 22.3-6.8 MG/ML ophthalmic solution 1 drop 2 (two) times daily.    . Glucosamine-Chondroitin (GLUCOSAMINE CHONDR COMPLEX PO) Take 1 tablet by mouth daily.    Marland Kitchen losartan-hydrochlorothiazide (HYZAAR) 50-12.5 MG tablet TAKE 1 TABLET BY MOUTH EVERY DAY 90 tablet 2  . metFORMIN (GLUCOPHAGE) 1000 MG tablet Take 1,000 mg by mouth at bedtime.     . metoprolol succinate (TOPROL-XL) 50 MG 24 hr tablet TAKE 1 TABLET BY MOUTH EVERY DAY 90 tablet 1  . Multiple Vitamins-Minerals (PRESERVISION AREDS 2 PO) Take 1 capsule by mouth 2 (two) times daily.    . pravastatin (PRAVACHOL) 20 MG tablet Take 20 mg by mouth at bedtime.    . rosuvastatin (CRESTOR) 20 MG tablet TAKE 1 TABLET BY MOUTH EVERY DAY 90 tablet 2  . vitamin E 400 UNIT capsule Take 400 Units by mouth daily.    Marland Kitchen VYZULTA 0.024 % SOLN Place 1 drop into the right eye  at bedtime.     No current facility-administered medications on file prior to visit.    No Known Allergies  Review of Systems No fevers, chills, nausea, muscle aches, no difficulty breathing, no calf pain, no chest pain or shortness of breath.   Physical Exam  GENERAL APPEARANCE: Alert, conversant. Appropriately groomed. No acute distress.   VASCULAR: Pedal pulses faintly palpable DP and unable to palpate PT bilateral.  Capillary refill time is immediate to all digits,  Proximal to distal cooling it warm to warm.  Digital perfusion adequate. Last vascular exam shows a digital toe pressure left hallux of . .   NEUROLOGIC: sensation is intact to 5.07 monofilament at 5/5 sites bilateral.  Light touch is intact bilateral, vibratory sensation intact bilateral  MUSCULOSKELETAL: acceptable muscle strength, tone and stability bilateral.  No gross boney pedal deformities noted.  No pain, crepitus or limitation noted with foot and ankle range of motion bilateral.   DERMATOLOGIC: skin is warm, supple, and dry. Left hallux nail has a deeply incurvated medial border with redness and swelling along the border.  Pain with direct pressure is elicited.  No pus or drainage noted.      Assessment     ICD-10-CM   1. Ingrown nail of great toe of left foot  L60.0      Plan  Treatment options and alternatives were discussed.  Reviewed his toe pressures and due to the deeply incurvated nature of the nail and likely hood for infection, I recommended a permanent removal of the medial  nail border of the left hallux nail.  Patient agreed. Skin was prepped with alcohol and a local injection of lidocaine and Marcaine plain was infiltrated to anesthetize the toe. The toe was then prepped with Betadine exsanguinated. The offending nail border is removed and phenol applied.  It was cleansed well with alcohol. Antibiotic ointment and a dressing was then applied and the patient was given instructions for soaks  and use of antibiotic ointment on the toe.  If any redness, swelling, drainage or signs of infection arise, he will call.  He will be seen back in a week to 10 days for nail check.

## 2020-05-28 ENCOUNTER — Encounter: Payer: Self-pay | Admitting: Podiatrist

## 2020-05-28 ENCOUNTER — Ambulatory Visit (INDEPENDENT_AMBULATORY_CARE_PROVIDER_SITE_OTHER): Payer: Medicare Other | Admitting: Podiatrist

## 2020-05-28 ENCOUNTER — Other Ambulatory Visit: Payer: Self-pay

## 2020-05-28 DIAGNOSIS — L600 Ingrowing nail: Secondary | ICD-10-CM

## 2020-05-28 NOTE — Progress Notes (Signed)
Chief Complaint  Patient presents with  . Ingrown Toenail    PT stated he has no concerns. He has had some bleeding      HPI: Patient is 79 y.o. male who presents today for follow-up of ingrown toenail procedure of the left great toe performed on 05/17/2020.  Patient states that it still bleeds a little from time to time but otherwise is doing great.  He has been able to play golf and wear golf shoes without discomfort.   No Known Allergies  Review of systems is reviewed and negative.   Physical Exam  Patient is awake, alert, and oriented x 3.  In no acute distress.    Vascular status is unchanged with faint pulses DP and non palpable PT left and capillary refill time less than 3 seconds left.  No edema or erythema noted.  Neurological exam reveals epicritic and protective sensation grossly intact bilateral.  Dermatological exam reveals skin is supple and dry to bilateral feet.  No open lesions present.   Musculoskeletal exam: Musculature intact with dorsiflexion, plantarflexion, inversion, eversion. Ankle and First MPJ joint range of motion normal.   Left medial nail border is healing well.  No redness, no swelling, no sign of infection noted.  Some discomfort with dorsal to plantar pressure is noted.  Assessment: Status post permanent removal medial border left hallux nail  Plan: Recommended the patient continue to keep the area clean and wear a Band-Aid to keep it covered.  He may start to leave open to air at night.  Concerns or problems arise with the toe he is to call immediately otherwise he will be seen back as needed for follow-up.

## 2020-06-27 DIAGNOSIS — E785 Hyperlipidemia, unspecified: Secondary | ICD-10-CM | POA: Diagnosis not present

## 2020-06-27 DIAGNOSIS — I25810 Atherosclerosis of coronary artery bypass graft(s) without angina pectoris: Secondary | ICD-10-CM | POA: Diagnosis not present

## 2020-06-27 DIAGNOSIS — Z7984 Long term (current) use of oral hypoglycemic drugs: Secondary | ICD-10-CM | POA: Diagnosis not present

## 2020-06-27 DIAGNOSIS — E1169 Type 2 diabetes mellitus with other specified complication: Secondary | ICD-10-CM | POA: Diagnosis not present

## 2020-06-27 DIAGNOSIS — I10 Essential (primary) hypertension: Secondary | ICD-10-CM | POA: Diagnosis not present

## 2020-07-03 ENCOUNTER — Other Ambulatory Visit: Payer: Self-pay

## 2020-07-03 ENCOUNTER — Encounter (INDEPENDENT_AMBULATORY_CARE_PROVIDER_SITE_OTHER): Payer: Medicare Other | Admitting: Ophthalmology

## 2020-07-03 DIAGNOSIS — H43813 Vitreous degeneration, bilateral: Secondary | ICD-10-CM | POA: Diagnosis not present

## 2020-07-03 DIAGNOSIS — H353231 Exudative age-related macular degeneration, bilateral, with active choroidal neovascularization: Secondary | ICD-10-CM | POA: Diagnosis not present

## 2020-07-03 DIAGNOSIS — I10 Essential (primary) hypertension: Secondary | ICD-10-CM

## 2020-07-03 DIAGNOSIS — H35033 Hypertensive retinopathy, bilateral: Secondary | ICD-10-CM | POA: Diagnosis not present

## 2020-07-04 ENCOUNTER — Encounter (INDEPENDENT_AMBULATORY_CARE_PROVIDER_SITE_OTHER): Payer: Medicare Other | Admitting: Ophthalmology

## 2020-07-04 DIAGNOSIS — H353221 Exudative age-related macular degeneration, left eye, with active choroidal neovascularization: Secondary | ICD-10-CM | POA: Diagnosis not present

## 2020-07-16 DIAGNOSIS — H02145 Spastic ectropion of left lower eyelid: Secondary | ICD-10-CM | POA: Diagnosis not present

## 2020-07-16 DIAGNOSIS — H02135 Senile ectropion of left lower eyelid: Secondary | ICD-10-CM | POA: Diagnosis not present

## 2020-07-16 DIAGNOSIS — H16212 Exposure keratoconjunctivitis, left eye: Secondary | ICD-10-CM | POA: Diagnosis not present

## 2020-07-16 DIAGNOSIS — H04522 Eversion of left lacrimal punctum: Secondary | ICD-10-CM | POA: Diagnosis not present

## 2020-07-16 DIAGNOSIS — H02142 Spastic ectropion of right lower eyelid: Secondary | ICD-10-CM | POA: Diagnosis not present

## 2020-07-16 DIAGNOSIS — H04521 Eversion of right lacrimal punctum: Secondary | ICD-10-CM | POA: Diagnosis not present

## 2020-07-16 DIAGNOSIS — H04123 Dry eye syndrome of bilateral lacrimal glands: Secondary | ICD-10-CM | POA: Diagnosis not present

## 2020-07-16 DIAGNOSIS — H02112 Cicatricial ectropion of right lower eyelid: Secondary | ICD-10-CM | POA: Diagnosis not present

## 2020-07-16 DIAGNOSIS — H16213 Exposure keratoconjunctivitis, bilateral: Secondary | ICD-10-CM | POA: Diagnosis not present

## 2020-07-16 DIAGNOSIS — H02115 Cicatricial ectropion of left lower eyelid: Secondary | ICD-10-CM | POA: Diagnosis not present

## 2020-07-16 DIAGNOSIS — H02132 Senile ectropion of right lower eyelid: Secondary | ICD-10-CM | POA: Diagnosis not present

## 2020-07-16 DIAGNOSIS — H16211 Exposure keratoconjunctivitis, right eye: Secondary | ICD-10-CM | POA: Diagnosis not present

## 2020-08-07 ENCOUNTER — Telehealth: Payer: Self-pay | Admitting: *Deleted

## 2020-08-07 NOTE — Telephone Encounter (Signed)
   [facility] Pre-operative Risk Assessment    HEARTCARE STAFF: - Please ensure there is not already an duplicate clearance open for this procedure. - Under Visit Info/Reason for Call, type in Other and utilize the format Clearance MM/DD/YY or Clearance TBD. Do not use dashes or single digits. - If request is for dental extraction, please clarify the # of teeth to be extracted.  Request for surgical clearance:  1. What type of surgery is being performed? B/L LOWER EYELID ECTROPION  And RETRACTION REPAIR   2. When is this surgery scheduled? 08/27/20   3. What type of clearance is required (medical clearance vs. Pharmacy clearance to hold med vs. Both)? MEDICAL  4. Are there any medications that need to be held prior to surgery and how long? ASA    5. Practice name and name of physician performing surgery? [facility]; DR. Elayne Snare ZALDIVAR   6. What is the office phone number? [phone]   7.   What is the office fax number? [phone]  8.   Anesthesia type (None, local, MAC, general) ? MAC   Julaine Hua 08/07/2020, 11:23 AM  _________________________________________________________________   (provider comments below)

## 2020-08-07 NOTE — Telephone Encounter (Signed)
   Primary Cardiologist: Lesleigh Noe, MD  Chart reviewed as part of pre-operative protocol coverage. Patient was contacted 08/07/2020 in reference to pre-operative risk assessment for pending surgery as outlined below.  Micheal [name] was last seen on 10/04/2019 by Dr. Katrinka Blazing at which time he was doing well.  Patient was last seen by Dr. Kirke Corin in August 2021 for lower extremity pain.  Since that day, Micheal [name] has done quite well without any chest pain or significant shortness of breath.  He is able to play golf 4 times a week without any issue.  Therefore, based on ACC/AHA guidelines, the patient would be at acceptable risk for the planned procedure without further cardiovascular testing.   I will forward this message to Dr. Katrinka Blazing to make sure it is okay for him to hold aspirin for 5 to 7 days prior to eyelid surgery.  The patient was advised that if he develops new symptoms prior to surgery to contact our office to arrange for a follow-up visit, and he verbalized understanding.  I will route this recommendation to the requesting party via Epic fax function and remove from pre-op pool. Please call with questions.  [place], [place] 08/07/2020, 1:38 PM

## 2020-08-09 NOTE — Telephone Encounter (Signed)
Okay to proceed.  

## 2020-08-27 DIAGNOSIS — H02112 Cicatricial ectropion of right lower eyelid: Secondary | ICD-10-CM | POA: Diagnosis not present

## 2020-08-27 DIAGNOSIS — H02115 Cicatricial ectropion of left lower eyelid: Secondary | ICD-10-CM | POA: Diagnosis not present

## 2020-08-27 DIAGNOSIS — H04521 Eversion of right lacrimal punctum: Secondary | ICD-10-CM | POA: Diagnosis not present

## 2020-08-27 DIAGNOSIS — H04563 Stenosis of bilateral lacrimal punctum: Secondary | ICD-10-CM | POA: Diagnosis not present

## 2020-08-27 DIAGNOSIS — H02145 Spastic ectropion of left lower eyelid: Secondary | ICD-10-CM | POA: Diagnosis not present

## 2020-08-27 DIAGNOSIS — H02142 Spastic ectropion of right lower eyelid: Secondary | ICD-10-CM | POA: Diagnosis not present

## 2020-08-27 DIAGNOSIS — H04522 Eversion of left lacrimal punctum: Secondary | ICD-10-CM | POA: Diagnosis not present

## 2020-08-27 DIAGNOSIS — H04523 Eversion of bilateral lacrimal punctum: Secondary | ICD-10-CM | POA: Diagnosis not present

## 2020-08-27 DIAGNOSIS — H02135 Senile ectropion of left lower eyelid: Secondary | ICD-10-CM | POA: Diagnosis not present

## 2020-08-27 DIAGNOSIS — H11823 Conjunctivochalasis, bilateral: Secondary | ICD-10-CM | POA: Diagnosis not present

## 2020-08-27 DIAGNOSIS — H02535 Eyelid retraction left lower eyelid: Secondary | ICD-10-CM | POA: Diagnosis not present

## 2020-08-27 DIAGNOSIS — H02132 Senile ectropion of right lower eyelid: Secondary | ICD-10-CM | POA: Diagnosis not present

## 2020-08-27 DIAGNOSIS — H02532 Eyelid retraction right lower eyelid: Secondary | ICD-10-CM | POA: Diagnosis not present

## 2020-09-18 ENCOUNTER — Encounter (INDEPENDENT_AMBULATORY_CARE_PROVIDER_SITE_OTHER): Payer: Medicare Other | Admitting: Ophthalmology

## 2020-09-18 ENCOUNTER — Other Ambulatory Visit: Payer: Self-pay

## 2020-09-18 DIAGNOSIS — H353231 Exudative age-related macular degeneration, bilateral, with active choroidal neovascularization: Secondary | ICD-10-CM

## 2020-09-18 DIAGNOSIS — H43813 Vitreous degeneration, bilateral: Secondary | ICD-10-CM | POA: Diagnosis not present

## 2020-09-18 DIAGNOSIS — I10 Essential (primary) hypertension: Secondary | ICD-10-CM

## 2020-09-18 DIAGNOSIS — H35033 Hypertensive retinopathy, bilateral: Secondary | ICD-10-CM

## 2020-09-19 ENCOUNTER — Encounter (INDEPENDENT_AMBULATORY_CARE_PROVIDER_SITE_OTHER): Payer: Medicare Other | Admitting: Ophthalmology

## 2020-09-19 DIAGNOSIS — H353221 Exudative age-related macular degeneration, left eye, with active choroidal neovascularization: Secondary | ICD-10-CM | POA: Diagnosis not present

## 2020-10-31 ENCOUNTER — Other Ambulatory Visit: Payer: Self-pay | Admitting: Interventional Cardiology

## 2020-11-14 DIAGNOSIS — J069 Acute upper respiratory infection, unspecified: Secondary | ICD-10-CM | POA: Diagnosis not present

## 2020-11-14 DIAGNOSIS — R509 Fever, unspecified: Secondary | ICD-10-CM | POA: Diagnosis not present

## 2020-11-14 DIAGNOSIS — Z03818 Encounter for observation for suspected exposure to other biological agents ruled out: Secondary | ICD-10-CM | POA: Diagnosis not present

## 2020-11-14 DIAGNOSIS — R059 Cough, unspecified: Secondary | ICD-10-CM | POA: Diagnosis not present

## 2020-11-18 NOTE — Progress Notes (Signed)
No show

## 2020-11-19 ENCOUNTER — Encounter: Payer: Self-pay | Admitting: Interventional Cardiology

## 2020-11-19 ENCOUNTER — Ambulatory Visit (INDEPENDENT_AMBULATORY_CARE_PROVIDER_SITE_OTHER): Payer: Medicare Other | Admitting: Interventional Cardiology

## 2020-11-19 VITALS — BP 100/60 | HR 69 | Ht 65.0 in | Wt 159.0 lb

## 2020-11-19 DIAGNOSIS — I2510 Atherosclerotic heart disease of native coronary artery without angina pectoris: Secondary | ICD-10-CM

## 2020-11-19 DIAGNOSIS — E0800 Diabetes mellitus due to underlying condition with hyperosmolarity without nonketotic hyperglycemic-hyperosmolar coma (NKHHC): Secondary | ICD-10-CM

## 2020-11-19 DIAGNOSIS — I739 Peripheral vascular disease, unspecified: Secondary | ICD-10-CM

## 2020-11-19 DIAGNOSIS — E785 Hyperlipidemia, unspecified: Secondary | ICD-10-CM

## 2020-11-19 DIAGNOSIS — I10 Essential (primary) hypertension: Secondary | ICD-10-CM

## 2020-12-03 DIAGNOSIS — U071 COVID-19: Secondary | ICD-10-CM | POA: Diagnosis not present

## 2020-12-04 ENCOUNTER — Ambulatory Visit
Admission: RE | Admit: 2020-12-04 | Discharge: 2020-12-04 | Disposition: A | Discharge status: Home / Self Care | Payer: Medicare Other | Source: Ambulatory Visit | Attending: Internal Medicine | Admitting: Internal Medicine

## 2020-12-04 ENCOUNTER — Other Ambulatory Visit: Payer: Self-pay | Admitting: Internal Medicine

## 2020-12-04 ENCOUNTER — Encounter (INDEPENDENT_AMBULATORY_CARE_PROVIDER_SITE_OTHER): Payer: Medicare Other | Admitting: Ophthalmology

## 2020-12-04 DIAGNOSIS — J40 Bronchitis, not specified as acute or chronic: Secondary | ICD-10-CM

## 2020-12-04 DIAGNOSIS — Z9889 Other specified postprocedural states: Secondary | ICD-10-CM | POA: Diagnosis not present

## 2020-12-05 ENCOUNTER — Encounter (INDEPENDENT_AMBULATORY_CARE_PROVIDER_SITE_OTHER): Payer: Medicare Other | Admitting: Ophthalmology

## 2021-01-09 ENCOUNTER — Encounter (INDEPENDENT_AMBULATORY_CARE_PROVIDER_SITE_OTHER): Payer: Medicare Other | Admitting: Ophthalmology

## 2021-01-09 ENCOUNTER — Other Ambulatory Visit: Payer: Self-pay

## 2021-01-09 DIAGNOSIS — H35033 Hypertensive retinopathy, bilateral: Secondary | ICD-10-CM

## 2021-01-09 DIAGNOSIS — I10 Essential (primary) hypertension: Secondary | ICD-10-CM | POA: Diagnosis not present

## 2021-01-09 DIAGNOSIS — H43813 Vitreous degeneration, bilateral: Secondary | ICD-10-CM | POA: Diagnosis not present

## 2021-01-09 DIAGNOSIS — H353231 Exudative age-related macular degeneration, bilateral, with active choroidal neovascularization: Secondary | ICD-10-CM

## 2021-01-10 ENCOUNTER — Encounter (INDEPENDENT_AMBULATORY_CARE_PROVIDER_SITE_OTHER): Payer: Medicare Other | Admitting: Ophthalmology

## 2021-01-10 DIAGNOSIS — H353211 Exudative age-related macular degeneration, right eye, with active choroidal neovascularization: Secondary | ICD-10-CM | POA: Diagnosis not present

## 2021-02-11 DIAGNOSIS — E785 Hyperlipidemia, unspecified: Secondary | ICD-10-CM | POA: Diagnosis not present

## 2021-02-11 DIAGNOSIS — R0609 Other forms of dyspnea: Secondary | ICD-10-CM | POA: Diagnosis not present

## 2021-02-11 DIAGNOSIS — I10 Essential (primary) hypertension: Secondary | ICD-10-CM | POA: Diagnosis not present

## 2021-02-11 DIAGNOSIS — R634 Abnormal weight loss: Secondary | ICD-10-CM | POA: Diagnosis not present

## 2021-02-11 DIAGNOSIS — J40 Bronchitis, not specified as acute or chronic: Secondary | ICD-10-CM | POA: Diagnosis not present

## 2021-02-14 DIAGNOSIS — Z0000 Encounter for general adult medical examination without abnormal findings: Secondary | ICD-10-CM | POA: Diagnosis not present

## 2021-02-14 DIAGNOSIS — Z1389 Encounter for screening for other disorder: Secondary | ICD-10-CM | POA: Diagnosis not present

## 2021-02-24 ENCOUNTER — Inpatient Hospital Stay (HOSPITAL_COMMUNITY)
Admission: EM | Admit: 2021-02-24 | Discharge: 2021-02-26 | DRG: 310 | Disposition: A | Discharge status: Home / Self Care | Payer: Medicare Other | Attending: Internal Medicine | Admitting: Internal Medicine

## 2021-02-24 ENCOUNTER — Other Ambulatory Visit: Payer: Self-pay

## 2021-02-24 ENCOUNTER — Emergency Department (HOSPITAL_COMMUNITY): Payer: Medicare Other

## 2021-02-24 ENCOUNTER — Encounter (HOSPITAL_COMMUNITY): Payer: Self-pay | Admitting: Emergency Medicine

## 2021-02-24 DIAGNOSIS — I252 Old myocardial infarction: Secondary | ICD-10-CM

## 2021-02-24 DIAGNOSIS — Z951 Presence of aortocoronary bypass graft: Secondary | ICD-10-CM

## 2021-02-24 DIAGNOSIS — I509 Heart failure, unspecified: Secondary | ICD-10-CM | POA: Diagnosis not present

## 2021-02-24 DIAGNOSIS — I110 Hypertensive heart disease with heart failure: Secondary | ICD-10-CM | POA: Diagnosis present

## 2021-02-24 DIAGNOSIS — Z7982 Long term (current) use of aspirin: Secondary | ICD-10-CM | POA: Diagnosis not present

## 2021-02-24 DIAGNOSIS — E1151 Type 2 diabetes mellitus with diabetic peripheral angiopathy without gangrene: Secondary | ICD-10-CM | POA: Diagnosis present

## 2021-02-24 DIAGNOSIS — R0789 Other chest pain: Secondary | ICD-10-CM | POA: Diagnosis not present

## 2021-02-24 DIAGNOSIS — Z8249 Family history of ischemic heart disease and other diseases of the circulatory system: Secondary | ICD-10-CM | POA: Diagnosis not present

## 2021-02-24 DIAGNOSIS — I2510 Atherosclerotic heart disease of native coronary artery without angina pectoris: Secondary | ICD-10-CM | POA: Diagnosis present

## 2021-02-24 DIAGNOSIS — I4891 Unspecified atrial fibrillation: Principal | ICD-10-CM

## 2021-02-24 DIAGNOSIS — Z79899 Other long term (current) drug therapy: Secondary | ICD-10-CM | POA: Diagnosis not present

## 2021-02-24 DIAGNOSIS — E7800 Pure hypercholesterolemia, unspecified: Secondary | ICD-10-CM | POA: Diagnosis present

## 2021-02-24 DIAGNOSIS — I480 Paroxysmal atrial fibrillation: Secondary | ICD-10-CM | POA: Diagnosis not present

## 2021-02-24 DIAGNOSIS — E782 Mixed hyperlipidemia: Secondary | ICD-10-CM | POA: Diagnosis not present

## 2021-02-24 DIAGNOSIS — Z7984 Long term (current) use of oral hypoglycemic drugs: Secondary | ICD-10-CM | POA: Diagnosis not present

## 2021-02-24 DIAGNOSIS — Z87891 Personal history of nicotine dependence: Secondary | ICD-10-CM | POA: Diagnosis not present

## 2021-02-24 DIAGNOSIS — I10 Essential (primary) hypertension: Secondary | ICD-10-CM | POA: Diagnosis present

## 2021-02-24 DIAGNOSIS — R002 Palpitations: Secondary | ICD-10-CM | POA: Diagnosis not present

## 2021-02-24 DIAGNOSIS — R0602 Shortness of breath: Secondary | ICD-10-CM | POA: Diagnosis not present

## 2021-02-24 DIAGNOSIS — I340 Nonrheumatic mitral (valve) insufficiency: Secondary | ICD-10-CM | POA: Diagnosis present

## 2021-02-24 DIAGNOSIS — E119 Type 2 diabetes mellitus without complications: Secondary | ICD-10-CM

## 2021-02-24 DIAGNOSIS — I517 Cardiomegaly: Secondary | ICD-10-CM | POA: Diagnosis not present

## 2021-02-24 DIAGNOSIS — E785 Hyperlipidemia, unspecified: Secondary | ICD-10-CM | POA: Diagnosis present

## 2021-02-24 DIAGNOSIS — Z20822 Contact with and (suspected) exposure to covid-19: Secondary | ICD-10-CM | POA: Diagnosis present

## 2021-02-24 DIAGNOSIS — J90 Pleural effusion, not elsewhere classified: Secondary | ICD-10-CM | POA: Diagnosis not present

## 2021-02-24 DIAGNOSIS — I081 Rheumatic disorders of both mitral and tricuspid valves: Secondary | ICD-10-CM | POA: Diagnosis not present

## 2021-02-24 DIAGNOSIS — J9811 Atelectasis: Secondary | ICD-10-CM | POA: Diagnosis not present

## 2021-02-24 DIAGNOSIS — J811 Chronic pulmonary edema: Secondary | ICD-10-CM | POA: Diagnosis not present

## 2021-02-24 DIAGNOSIS — I4819 Other persistent atrial fibrillation: Secondary | ICD-10-CM | POA: Diagnosis not present

## 2021-02-24 DIAGNOSIS — I361 Nonrheumatic tricuspid (valve) insufficiency: Secondary | ICD-10-CM | POA: Diagnosis not present

## 2021-02-24 DIAGNOSIS — K8020 Calculus of gallbladder without cholecystitis without obstruction: Secondary | ICD-10-CM | POA: Diagnosis not present

## 2021-02-24 LAB — CBC WITH DIFFERENTIAL/PLATELET
Abs Immature Granulocytes: 0.06 10*3/uL (ref 0.00–0.07)
Basophils Absolute: 0.1 10*3/uL (ref 0.0–0.1)
Basophils Relative: 1 %
Eosinophils Absolute: 0.1 10*3/uL (ref 0.0–0.5)
Eosinophils Relative: 1 %
HCT: 41.3 % (ref 39.0–52.0)
Hemoglobin: 13.2 g/dL (ref 13.0–17.0)
Immature Granulocytes: 1 %
Lymphocytes Relative: 19 %
Lymphs Abs: 1.3 10*3/uL (ref 0.7–4.0)
MCH: 32.4 pg (ref 26.0–34.0)
MCHC: 32 g/dL (ref 30.0–36.0)
MCV: 101.2 fL — ABNORMAL HIGH (ref 80.0–100.0)
Monocytes Absolute: 0.7 10*3/uL (ref 0.1–1.0)
Monocytes Relative: 11 %
Neutro Abs: 4.5 10*3/uL (ref 1.7–7.7)
Neutrophils Relative %: 67 %
Platelets: 239 10*3/uL (ref 150–400)
RBC: 4.08 MIL/uL — ABNORMAL LOW (ref 4.22–5.81)
RDW: 15.8 % — ABNORMAL HIGH (ref 11.5–15.5)
WBC: 6.6 10*3/uL (ref 4.0–10.5)
nRBC: 0 % (ref 0.0–0.2)

## 2021-02-24 LAB — PROTIME-INR
INR: 1.4 — ABNORMAL HIGH (ref 0.8–1.2)
Prothrombin Time: 17.1 seconds — ABNORMAL HIGH (ref 11.4–15.2)

## 2021-02-24 LAB — BASIC METABOLIC PANEL
Anion gap: 11 (ref 5–15)
BUN: 19 mg/dL (ref 8–23)
CO2: 22 mmol/L (ref 22–32)
Calcium: 9.1 mg/dL (ref 8.9–10.3)
Chloride: 102 mmol/L (ref 98–111)
Creatinine, Ser: 1.15 mg/dL (ref 0.61–1.24)
GFR, Estimated: >60 mL/min (ref >60)
Glucose, Bld: 192 mg/dL — ABNORMAL HIGH (ref 70–99)
Potassium: 5 mmol/L (ref 3.5–5.1)
Sodium: 135 mmol/L (ref 135–145)

## 2021-02-24 LAB — RESP PANEL BY RT-PCR (FLU A&B, COVID) ARPGX2
Influenza A by PCR: NEGATIVE
Influenza B by PCR: NEGATIVE
SARS Coronavirus 2 by RT PCR: NEGATIVE

## 2021-02-24 LAB — TROPONIN I (HIGH SENSITIVITY)
Troponin I (High Sensitivity): 14 ng/L (ref <18)
Troponin I (High Sensitivity): 14 ng/L (ref <18)

## 2021-02-24 LAB — BRAIN NATRIURETIC PEPTIDE: B Natriuretic Peptide: 1394.6 pg/mL — ABNORMAL HIGH (ref 0.0–100.0)

## 2021-02-24 MED ORDER — HEPARIN BOLUS VIA INFUSION
4000.0000 [IU] | Freq: Once | INTRAVENOUS | Status: DC
  Filled 2021-02-24: qty 4000

## 2021-02-24 MED ORDER — LOSARTAN POTASSIUM-HCTZ 50-12.5 MG PO TABS: 1.0000 | ORAL_TABLET | Freq: Every day | ORAL | Status: DC

## 2021-02-24 MED ORDER — METOPROLOL SUCCINATE ER 50 MG PO TB24
50.0000 mg | ORAL_TABLET | Freq: Every day | ORAL | Status: DC
  Administered 2021-02-24 – 2021-02-25 (×2): 50 mg via ORAL
  Filled 2021-02-24 (×2): qty 1

## 2021-02-24 MED ORDER — LOSARTAN POTASSIUM 50 MG PO TABS
50.0000 mg | ORAL_TABLET | Freq: Every day | ORAL | Status: DC
  Administered 2021-02-25 – 2021-02-26 (×2): 50 mg via ORAL
  Filled 2021-02-24 (×2): qty 1

## 2021-02-24 MED ORDER — IOHEXOL 350 MG/ML SOLN
100.0000 mL | Freq: Once | INTRAVENOUS | Status: AC | PRN
  Administered 2021-02-24: 100 mL via INTRAVENOUS

## 2021-02-24 MED ORDER — HYDROCHLOROTHIAZIDE 12.5 MG PO CAPS
12.5000 mg | ORAL_CAPSULE | Freq: Every day | ORAL | Status: DC
  Administered 2021-02-25 – 2021-02-26 (×2): 12.5 mg via ORAL
  Filled 2021-02-24 (×2): qty 1

## 2021-02-24 MED ORDER — HEPARIN (PORCINE) 25000 UT/250ML-% IV SOLN: 1000.0000 [IU]/h | INTRAVENOUS | Status: DC

## 2021-02-24 MED ORDER — PRAVASTATIN SODIUM 10 MG PO TABS
20.0000 mg | ORAL_TABLET | Freq: Every day | ORAL | Status: DC
  Administered 2021-02-24 – 2021-02-25 (×2): 20 mg via ORAL
  Filled 2021-02-24 (×2): qty 2

## 2021-02-24 MED ORDER — METFORMIN HCL 500 MG PO TABS: 1000.0000 mg | ORAL_TABLET | Freq: Every day | ORAL | Status: DC

## 2021-02-24 MED ORDER — APIXABAN 5 MG PO TABS
5.0000 mg | ORAL_TABLET | Freq: Two times a day (BID) | ORAL | Status: DC
  Administered 2021-02-24 – 2021-02-25 (×3): 5 mg via ORAL
  Filled 2021-02-24 (×4): qty 1

## 2021-02-24 MED ORDER — FUROSEMIDE 10 MG/ML IJ SOLN
20.0000 mg | Freq: Once | INTRAMUSCULAR | Status: AC
  Administered 2021-02-24: 20 mg via INTRAVENOUS
  Filled 2021-02-24: qty 2

## 2021-02-24 MED ORDER — VITAMIN B-12 1000 MCG PO TABS
1000.0000 ug | ORAL_TABLET | Freq: Every day | ORAL | Status: DC
  Administered 2021-02-25 – 2021-02-26 (×2): 1000 ug via ORAL
  Filled 2021-02-24 (×2): qty 1

## 2021-02-24 NOTE — Progress Notes (Signed)
ANTICOAGULATION CONSULT NOTE - Initial Consult  Pharmacy Consult for IV heparin Indication: atrial fibrillation  No Known Allergies  Patient Measurements:   Heparin Dosing Weight: ~ 70 kg  Vital Signs: Temp: 97.8 F (36.6 C) (07/31 1336) Temp Source: Oral (07/31 1336) BP: 144/94 (07/31 1900) Pulse Rate: 68 (07/31 1900)  Labs: Recent Labs    02/24/21 1142 02/24/21 1342  HGB 13.2  --   HCT 41.3  --   PLT 239  --   CREATININE 1.15  --   TROPONINIHS 14 14    CrCl cannot be calculated (Unknown ideal weight.).   Medical History: Past Medical History:  Diagnosis Date   CAD (coronary artery disease)    with Cabg 2008   Claudication (HCC)    Diabetes (HCC)    HTN (hypertension)    Hypercholesteremia    Hyperlipidemia     Medications:  Infusions:   heparin      Assessment: 80 yo male admitted with SOB, palpitations, found with new afib.  No anticoagulants noted PTA per chart review.  No known hx bleeding.  Goal of Therapy:  Heparin level 0.3-0.7 units/ml Monitor platelets by anticoagulation protocol: Yes   Plan:  Start IV heparin with bolus of 4000 units x 1 now. Then start heparin gtt at 1000 units/hr. Check heparin level 8 hrs after gtt starts. Daily heparin level and CBC.  Reece Leader, Colon Flattery, B[facility]P Clinical Pharmacist  02/24/2021 7:33 PM   [facility] pharmacy phone numbers are listed on [email or web address]

## 2021-02-24 NOTE — ED Provider Notes (Signed)
[facility] EMERGENCY DEPARTMENT Provider Note   CSN: [id number] Arrival date & time: 02/24/21  1127     History CC: Shortness of breath   Micheal [name] is a 80 y.o. male with history of hypertension, coronary disease status post bypass, diabetes, high cholesterol, presented ED with shortness of breath.  Patient ports been ongoing for several days.  It seems to come and go.  We will have days where he feels very winded and cannot make it across the room.  He currently feels short of breath and says "I can even take in a full breath".  He reports palpitations and pressure in his chest as well.  He denies leg swelling or weight gain.  He denies fevers or chills.  He has a chronic cough.  He reports he tries to play golf 3 times a week but has been unable to do so this week due to dyspnea.  He denies any known prior history of A. fib.  He takes aspirin, no A/C.  HPI     Past Medical History:  Diagnosis Date  . CAD (coronary artery disease)    with Cabg 2008  . Claudication (HCC)   . Diabetes (HCC)   . HTN (hypertension)   . Hypercholesteremia   . Hyperlipidemia     Patient Active Problem List   Diagnosis Date Noted  . CHF (congestive heart failure) (HCC) 02/24/2021  . Coronary artery disease involving coronary bypass graft of native heart without angina pectoris 02/23/2014  . Essential hypertension 02/23/2014  . CAD (coronary artery disease)   . HTN (hypertension)   . Hyperlipidemia   . Diabetes (HCC)   . Claudication [facility])     Past Surgical History:  Procedure Laterality Date  . ABDOMINAL AORTOGRAM W/LOWER EXTREMITY N/A 09/22/2018   Procedure: ABDOMINAL AORTOGRAM W/LOWER EXTREMITY;  Surgeon: Iran Ouch, MD;  Location: [facility] INVASIVE CV LAB;  Service: Cardiovascular;  Laterality: N/A;  . Coronary bypass surgery     2008       Family History  Problem Relation Age of Onset  . Heart disease Father   . Healthy Sister   . Heart attack Maternal  Grandfather   . Healthy Sister   . Healthy Sister     Social History   Tobacco Use  . Smoking status: Former  . Smokeless tobacco: Never  Substance Use Topics  . Alcohol use: Yes    Alcohol/week: 1.0 standard drink    Types: 1 Glasses of wine per week  . Drug use: No    Home Medications Prior to Admission medications   Medication Sig Start Date End Date Taking? Authorizing Provider  Acetylcysteine (NAC 600) 600 MG CAPS Take 1 capsule by mouth daily.   Yes [provider]  Cholecalciferol (VITAMIN D) 50 MCG (2000 UT) tablet Take 2,000 Units by mouth in the morning and at bedtime.   Yes [provider]  CINNAMON PO Take 1,000 mg by mouth daily.   Yes [provider]  Coenzyme Q10 (CO Q-10) 200 MG CAPS Take 200 mg by mouth daily.    Yes [provider]  dorzolamide-timolol (COSOPT) 22.3-6.8 MG/ML ophthalmic solution Place 1 drop into both eyes 2 (two) times daily. 03/07/20  Yes [provider]  Glucosamine-Chondroitin (GLUCOSAMINE CHONDR COMPLEX PO) Take 1 tablet by mouth daily.   Yes [provider]  levOCARNitine (L-CARNITINE) 500 MG TABS Take 1 tablet by mouth in the morning.   Yes [provider]  losartan-hydrochlorothiazide (  HYZAAR) 50-12.5 MG tablet TAKE 1 TABLET BY MOUTH EVERY DAY 10/31/20  Yes Iran Ouch, MD  metFORMIN (GLUCOPHAGE) 1000 MG tablet Take 1,000 mg by mouth at bedtime.  04/03/15  Yes [provider]  metoprolol succinate (TOPROL-XL) 50 MG 24 hr tablet TAKE 1 TABLET BY MOUTH EVERY DAY Patient taking differently: Take 50 mg by mouth at bedtime. 08/17/19  Yes Lyn Records, MD  Misc Natural Products (NF FORMULAS TESTOSTERONE) CAPS Take 1 capsule by mouth in the morning.   Yes [provider]  Multiple Vitamins-Minerals (PRESERVISION AREDS 2 PO) Take 1 capsule by mouth 2 (two) times daily.   Yes [provider]  Omega-3 Fatty Acids (FISH OIL PO) Take 1 capsule by mouth daily.    Yes [provider]  pravastatin (PRAVACHOL) 20 MG tablet Take 20 mg by mouth at bedtime. 01/26/20  Yes [provider]  QUERCETIN PO Take 1 tablet by mouth daily.   Yes [provider]  vitamin B-12 (CYANOCOBALAMIN) 1000 MCG tablet Take 1,000 mcg by mouth daily.   Yes [provider]  vitamin E 400 UNIT capsule Take 400 Units by mouth daily.   Yes [provider]  VYZULTA 0.024 % SOLN Place 1 drop into the right eye at bedtime. 04/16/20  Yes [provider]  rosuvastatin (CRESTOR) 20 MG tablet TAKE 1 TABLET BY MOUTH EVERY DAY Patient not taking: No sig reported 06/27/19   Lyn Records, MD    Allergies    Patient has no known allergies.  Review of Systems   Review of Systems  Constitutional:  Negative for chills and fever.  HENT:  Negative for ear pain and sore throat.   Eyes:  Negative for pain and visual disturbance.  Respiratory:  Positive for shortness of breath. Negative for cough.   Cardiovascular:  Positive for chest pain and palpitations.  Gastrointestinal:  Negative for abdominal pain and vomiting.  Genitourinary:  Negative for dysuria and hematuria.  Musculoskeletal:  Negative for arthralgias and back pain.  Skin:  Negative for color change and rash.  Neurological:  Negative for syncope and headaches.  All other systems reviewed and are negative.  Physical Exam Updated Vital Signs BP (!) 161/99   Pulse 67   Temp 97.8 F (36.6 C) (Oral)   Resp (!) 25   SpO2 95%   Physical Exam Constitutional:      General: He is not in acute distress. HENT:     Head: Normocephalic and atraumatic.  Eyes:     Conjunctiva/sclera: Conjunctivae normal.     Pupils: Pupils are equal, round, and reactive to light.  Cardiovascular:     Rate and Rhythm: Normal rate. Rhythm irregular.     Pulses: Normal pulses.  Pulmonary:     Effort: Pulmonary effort is normal. No respiratory distress.  Abdominal:     General: There is no  distension.     Tenderness: There is no abdominal tenderness.  Skin:    General: Skin is warm and dry.  Neurological:     General: No focal deficit present.     Mental Status: He is alert and oriented to person, place, and time. Mental status is at baseline.  Psychiatric:        Mood and Affect: Mood normal.        Behavior: Behavior normal.    ED Results / Procedures / Treatments   Labs (all labs ordered are listed, but only abnormal results are displayed) Labs Reviewed  CBC WITH DIFFERENTIAL/PLATELET - Abnormal; Notable for the following components:      Result Value   RBC 4.08 (*)    MCV 101.2 (*)    RDW 15.8 (*)    All other components within normal limits  BASIC METABOLIC PANEL - Abnormal; Notable for the following components:   Glucose, Bld 192 (*)    All other components within normal limits  BRAIN NATRIURETIC PEPTIDE - Abnormal; Notable for the following components:   B Natriuretic Peptide 1,394.6 (*)    All other components within normal limits  PROTIME-INR - Abnormal; Notable for the following components:   Prothrombin Time 17.1 (*)    INR 1.4 (*)    All other components within normal limits  RESP PANEL BY RT-PCR (FLU A&B, COVID) ARPGX2  CBC  TROPONIN I (HIGH SENSITIVITY)  TROPONIN I (HIGH SENSITIVITY)    EKG EKG Interpretation  Date/Time:  Sunday February 24 2021 11:28:11 EDT Ventricular Rate:  72 PR Interval:    QRS Duration: 94 QT Interval:  444 QTC Calculation: 486 R Axis:   86 Text Interpretation: A FIB Abnormal QRS-T angle, consider primary T wave abnormality Prolonged QT Abnormal ECG Confirmed by Alvester Chou [phone]) on 02/24/2021 2:59:19 PM  Radiology DG Chest 2 View  Result Date: 02/24/2021 CLINICAL DATA:  Shortness of breath for 2 days. EXAM: CHEST - 2 VIEW COMPARISON:  PA and lateral chest 12/04/2020. FINDINGS: The patient is status post CABG. Heart size is upper normal with mild vascular congestion. There small bilateral pleural effusions and  basilar atelectasis. No consolidative process or pneumothorax. IMPRESSION: Mild pulmonary vascular congestion. Very small bilateral pleural effusions and basilar atelectasis. Electronically Signed   By: Drusilla Kanner M.D.   On: 02/24/2021 12:26   CT Angio Chest PE W and/or Wo Contrast  Result Date: 02/24/2021 CLINICAL DATA:  Shortness of breath. Worsening yesterday and today. Palpitations. Chest tightness. EXAM: CT ANGIOGRAPHY CHEST WITH CONTRAST TECHNIQUE: Multidetector CT imaging of the chest was performed using the standard protocol during bolus administration of intravenous contrast. Multiplanar CT image reconstructions and MIPs were obtained to evaluate the vascular anatomy. CONTRAST:  OMNIPAQUE IOHEXOL 350 MG/ML SOLN COMPARISON:  Plain film of earlier today.  No prior CT. FINDINGS: Cardiovascular: The quality of this exam for evaluation of pulmonary embolism is good. The bolus is well timed. No evidence of pulmonary embolism. Aortic atherosclerosis. Aorta not well opacified secondary to bolus timing. Moderate cardiomegaly with right-sided chamber enlargement and contrast reflux into the IVC and hepatic veins. Median sternotomy for CABG. Mediastinum/Nodes: Multiple small mediastinal and and borderline enlarged right hilar node are likely secondary to fluid overload. Lungs/Pleura: Small bilateral pleural effusions, significantly more impressive than on plain film. Areas of smooth septal thickening within both lungs are relatively mild. Scattered areas of ground-glass in the perihilar lungs bilaterally. Upper Abdomen: Small dependent gallstones. Normal imaged portions of the spleen, stomach, pancreas, right adrenal gland, kidneys. Left adrenal thickening. Musculoskeletal: Prior median sternotomy. Review of the MIP images confirms the above findings. IMPRESSION: 1. No evidence of pulmonary embolism. 2. Findings of congestive heart failure, including cardiomegaly, bilateral pleural effusions, and  contrast reflux into the IVC and hepatic veins. Latter findings suggest elevated right heart pressures. 3. Cholelithiasis. 4. Aortic Atherosclerosis (ICD10-I70.0). Electronically Signed   By: Jeronimo Greaves M.D.   On: 02/24/2021 16:35    Procedures .Critical Care  Date/Time: 02/24/2021 10:28 PM Performed by: Terald Sleeper, MD Authorized by: Terald Sleeper, MD   Critical care  provider statement:    Critical care time (minutes):  35   Critical care was necessary to treat or prevent imminent or life-threatening deterioration of the following conditions:  Cardiac failure   Critical care was time spent personally by me on the following activities:  Discussions with consultants, evaluation of patient's response to treatment, examination of patient, ordering and performing treatments and interventions, ordering and review of laboratory studies, ordering and review of radiographic studies, pulse oximetry, re-evaluation of patient's condition, obtaining history from patient or surrogate and review of old charts Comments:     IV diuresis for CHF exacerbation/dyspnea    Medications Ordered in ED Medications  apixaban (ELIQUIS) tablet 5 mg (5 mg Oral Given 02/24/21 2110)  furosemide (LASIX) injection 20 mg (20 mg Intravenous Given 02/24/21 1551)  iohexol (OMNIPAQUE) 350 MG/ML injection 100 mL (100 mLs Intravenous Contrast Given 02/24/21 1617)    ED Course  I have reviewed the triage vital signs and the nursing notes.  Pertinent labs & imaging results that were available during my care of the patient were reviewed by me and considered in my medical decision making (see chart for details).  Pt here with dyspnea Likely some component of mild CHF (new) in setting of new onset A Fib (rate controlled), unclear onset of symptoms.  I don't see records of A Fib noted in his cardiology office note  Labs reviewed.  Troponins are flat.  I do not see acute ischemic findings on EKG to suspect STEMI.  X-ray  shows some pulmonary edema bilaterally.  He is otherwise stable on room air, not requiring oxygen.  We will give him some IV diuresis.  Proceed to a CT PE scan, and will also check a BNP.  CHADSVACS2 score of 5.  Clinical Course as of 02/24/21 2228  Sun Feb 24, 2021  1740 B Natriuretic Peptide(!): 1,394.6 [MT]  1740 Pulmonary edema on CT scan and elevated BNP. [MT]  [id number] Patient is produced about 2 L of urine.  I spoke to the cardiologist, evaluate him and admit him.  He had recommended initiation of heparin at this time.  I updated the patient is in agreement. [MT]  2047 After admission cardiologist recommended holding heparin and instead initiation of eliquis.  The patient has not started heparin yet.  Eliquis ordered [MT]    Clinical Course User Index [MT] Terald Sleeperrifan, Fayez Sturgell J, MD    Final Clinical Impression(s) / ED Diagnoses Final diagnoses:  Congestive heart failure, unspecified HF chronicity, unspecified heart failure type [facility](HCC)  New onset atrial fibrillation [facility](HCC)    Rx / DC Orders ED Discharge Orders     None        Terald Sleeperrifan, Wm Sahagun J, MD 02/24/21 2228

## 2021-02-24 NOTE — Progress Notes (Addendum)
ANTICOAGULATION CONSULT NOTE - Initial Consult  Pharmacy Consult for IV Apixaban Indication: atrial fibrillation  No Known Allergies  Patient Measurements:   Heparin Dosing Weight: ~ 70 kg  Vital Signs: Temp: 97.8 F (36.6 C) (07/31 1336) Temp Source: Oral (07/31 1336) BP: 151/82 (07/31 1930) Pulse Rate: 84 (07/31 1945)  Labs: Recent Labs    02/24/21 1142 02/24/21 1342  HGB 13.2  --   HCT 41.3  --   PLT 239  --   CREATININE 1.15  --   TROPONINIHS 14 14     CrCl cannot be calculated (Unknown ideal weight.).   Medical History: Past Medical History:  Diagnosis Date   CAD (coronary artery disease)    with Cabg 2008   Claudication (HCC)    Diabetes (HCC)    HTN (hypertension)    Hypercholesteremia    Hyperlipidemia     Medications:  Infusions:   Assessment: 80 yo male admitted with SOB, palpitations, found with new afib.  No anticoagulants noted PTA per chart review.  No known hx bleeding.  Heparin initially ordered, but cardiology now recommending starting apixaban. Does not meet criteria for dose reduction.   Plan:  Apixaban 5mg  BID Will check baseline PT/INR Daily CBC.  , PharmD, BCPS 02/24/2021 8:23 PM ED Clinical Pharmacist -  [phone]  Addendum  Rx will follow peripherally.  [phone], PharmD, BCIDP, AAHIVP, CPP Infectious Disease Pharmacist 02/25/2021 9:49 AM

## 2021-02-24 NOTE — H&P (Signed)
Cardiology Admission History and Physical:   Patient ID: Micheal [name] MRN: [id number]; DOB: October 24, 1940   Admission date: 02/24/2021  Primary Care Provider: Lorenda IshiharaVaradarajan, Rupashree, MD [facility] Cardiologist: Lesleigh NoeHenry W Smith III, MD  [facility] Electrophysiologist:  None  Chief Complaint: palpitations   Patient Profile:   Micheal [name] is a 80 y.o. male with CAD s/p CABG (2008), DM2, HTN, HLD, prior tobacco use (quit 2008), and PVD (mild R SFA dz) who presents with palpitations.   History of Present Illness:   Micheal [name] reports that since Thursday he has had elevated HR whenever he minimally exerts himself.  He is fairly active at baseline and plays golf 3-4 times per week.  He usually walks at least 9 of 18 holes without any issue.  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  During these episodes when his heart rate would increase he also noticed "leg and heart rate."  He felt that he could barely walk any distance and that even when he stopped his leg fatigue and heart rate would not significantly change.  He went to [place] last week for an aviation show and could barely make it through the airport because of leg fatigue and generalized weakness.  His weight has not changed recently as he has been fairly consistent at 150 lb.  He was 173-174 lb 3 years ago however he thinks that he got COVID before anybody really knew about it.  Initially he thought his current symptoms were related to "long COVID" even though he has been tested multiple times and has never been positive.  He had an episode of fairly persistent coughing over the past several months along with daily nausea with no emesis and headache.  He also is sleeping more than normal.  His primary care doctor thought that he had bronchitis however most of the symptoms have now fortunately resolved.  He came back from [place] on Thursday and went golfing Friday here  locally.  On the front 9 holes he had to ride everywhere because he was so fatigued and could even walk to some of the holes to play.  On the back 9 however he was surprised because his symptoms had gone away and he was almost back to his baseline.  Fortunately later on Friday he started to have worsening SOB and had DOE from just going from the bedroom to the kitchen.  He could feel that his heart rate had increased and was somewhat irregular.  Friday night he went to bed and was able to sleep on his stomach with only 1 pillow.  He denied any orthopnea or PND.  Saturday morning he woke up and felt that his heart was racing it was similar symptoms Friday afternoon.  He went golfing Saturday however and played 18 holes without significant limitation.  Sunday he woke up and had similar recurrence of fatigue and shortness of breath and for the first time in probably 5 years he did play on a Sunday.  He was comfortable and in no distress during my discussion with him.  VS unremarkable (HR 77/AF, SpO2 93% on RA, RR 21, BP 163/86 (110).  He was asymptomatic and in rate controlled AF while we were talking however I walked him down the hall and had a slow to normal pace he was somewhat winded after around 30 yards.  His heart rate during that time did not increase above 90.  He  could not tell that his heart rate was irregular when it was rate controlled.  Past Medical History:  Diagnosis Date   CAD (coronary artery disease)    with Cabg 2008   Claudication [facility])    Diabetes (HCC)    HTN (hypertension)    Hypercholesteremia    Hyperlipidemia    Past Surgical History:  Procedure Laterality Date   ABDOMINAL AORTOGRAM W/LOWER EXTREMITY N/A 09/22/2018   Procedure: ABDOMINAL AORTOGRAM W/LOWER EXTREMITY;  Surgeon: Iran Ouch, MD;  Location: [facility] INVASIVE CV LAB;  Service: Cardiovascular;  Laterality: N/A;   Coronary bypass surgery     2008    Medications Prior to Admission: Prior to Admission medications    Medication Sig Start Date End Date Taking? Authorizing Provider  cholecalciferol (VITAMIN D3) 25 MCG (1000 UT) tablet Take 1,000 Units by mouth daily.    [provider]  Coenzyme Q10 (CO Q-10) 200 MG CAPS Take 200 mg by mouth daily.     [provider]  Cyanocobalamin (VITAMIN B 12 PO) Take 1,000 mcg by mouth daily.     [provider]  dorzolamide (TRUSOPT) 2 % ophthalmic solution Place 1 drop into both eyes 2 (two) times daily.    [provider]  dorzolamide-timolol (COSOPT) 22.3-6.8 MG/ML ophthalmic solution 1 drop 2 (two) times daily. 03/07/20   [provider]  Glucosamine-Chondroitin (GLUCOSAMINE CHONDR COMPLEX PO) Take 1 tablet by mouth daily.    [provider]  losartan-hydrochlorothiazide (HYZAAR) 50-12.5 MG tablet TAKE 1 TABLET BY MOUTH EVERY DAY 10/31/20   Iran Ouch, MD  metFORMIN (GLUCOPHAGE) 1000 MG tablet Take 1,000 mg by mouth at bedtime.  04/03/15   [provider]  metoprolol succinate (TOPROL-XL) 50 MG 24 hr tablet TAKE 1 TABLET BY MOUTH EVERY DAY 08/17/19   Lyn Records, MD  Multiple Vitamins-Minerals (PRESERVISION AREDS 2 PO) Take 1 capsule by mouth 2 (two) times daily.    [provider]  pravastatin (PRAVACHOL) 20 MG tablet Take 20 mg by mouth at bedtime. 01/26/20   [provider]  rosuvastatin (CRESTOR) 20 MG tablet TAKE 1 TABLET BY MOUTH EVERY DAY 06/27/19   Lyn Records, MD  vitamin E 400 UNIT capsule Take 400 Units by mouth daily.    [provider]  VYZULTA 0.024 % SOLN Place 1 drop into the right eye at bedtime. 04/16/20   [provider]    Allergies:   No Known Allergies  Social History:   Social History   Socioeconomic History   Marital status: Widowed    Spouse name: Not on file   Number of children: Not on file   Years of education: Not on file   Highest education level: Not on file  Occupational History   Not on file  Tobacco Use   Smoking  status: Former   Smokeless tobacco: Never  Substance and Sexual Activity   Alcohol use: Yes    Alcohol/week: 1.0 standard drink    Types: 1 Glasses of wine per week   Drug use: No   Sexual activity: Yes  Other Topics Concern   Not on file  Social History Narrative   Not on file   Social Determinants of Health   Financial Resource Strain: Not on file  Food Insecurity: Not on file  Transportation Needs: Not on file  Physical Activity: Not on file  Stress: Not on file  Social Connections: Not on file  Intimate Partner Violence: Not on file  Family History:   The patient's family history includes Healthy in his sister, sister, and sister; Heart attack in his maternal grandfather; Heart disease in his father.    ROS:   Review of Systems: [y] = yes, [ ]  = no      General: Weight gain [ ] ; Weight loss [ ] ; Anorexia [ ] ; Fatigue [ ] ; Fever [ ] ; Chills [ ] ; Weakness [ ]    Cardiac: Chest pain/pressure [ ] ; Resting SOB [y]; Exertional SOB [y]; Orthopnea [ ] ; Pedal Edema [ ] ; Palpitations [y]; Syncope [ ] ; Presyncope [ ] ; Paroxysmal nocturnal dyspnea [ ]    Pulmonary: Cough [ ] ; Wheezing [ ] ; Hemoptysis [ ] ; Sputum [ ] ; Snoring [ ]    GI: Vomiting [ ] ; Dysphagia [ ] ; Melena [ ] ; Hematochezia [ ] ; Heartburn [ ] ; Abdominal pain [ ] ; Constipation [ ] ; Diarrhea [ ] ; BRBPR [ ]    GU: Hematuria [ ] ; Dysuria [ ] ; Nocturia [ ]  Vascular: Pain in legs with walking [ ] ; Pain in feet with lying flat [ ] ; Non-healing sores [ ] ; Stroke [ ] ; TIA [ ] ; Slurred speech [ ] ;   Neuro: Headaches [ ] ; Vertigo [ ] ; Seizures [ ] ; Paresthesias [ ] ;Blurred vision [ ] ; Diplopia [ ] ; Vision changes [ ]    Ortho/Skin: Arthritis [ ] ; Joint pain [ ] ; Muscle pain [ ] ; Joint swelling [ ] ; Back Pain [ ] ; Rash [ ]    Psych: Depression [ ] ; Anxiety [ ]    Heme: Bleeding problems [ ] ; Clotting disorders [ ] ; Anemia [ ]    Endocrine: Diabetes [ ] ; Thyroid dysfunction [ ]    Physical Exam/Data:   Vitals:   02/24/21 2100 02/24/21  2145 02/24/21 2200 02/24/21 2329  BP: (!) 120/44 (!) 145/81 (!) 161/99 (!) 153/58  Pulse: 70 68 67 76  Resp: (!) 22 (!) 25 (!) 25 19  Temp:    97.7 F (36.5 C)  TempSrc:    Oral  SpO2: 95% 95% 95% 92%  Height:    5\' 8"  (1.727 m)    Intake/Output Summary (Last 24 hours) at 02/25/2021 0158 Last data filed at 02/24/2021 2351 Gross per 24 hour  Intake 240 ml  Output --  Net 240 ml   Last 3 Weights 11/19/2020 02/28/2020 10/04/2019  Weight (lbs) 159 lb 165 lb 166 lb 1.9 oz  Weight (kg) 72.122 kg 74.844 kg 75.352 kg     Body mass index is 24.18 kg/m.  General:  Well nourished, well developed, in no acute distress HEENT: normal Lymph: no adenopathy Neck: no JVD Endocrine:  No thryomegaly Vascular: No carotid bruits; FA pulses 2+ bilaterally without bruits  Cardiac:  regular rate, irregular rhythm; no murmur  Lungs:  clear to auscultation bilaterally, no wheezing, rhonchi or rales  Abd: soft, nontender, no hepatomegaly  Ext: no LE edema Musculoskeletal:  No deformities, BUE and BLE strength normal and equal Skin: warm and dry  Neuro:  CNs 2-12 intact, no focal abnormalities noted Psych:  Normal affect   EKG:  The ECG that was done 02/24/21 (11:28) shows rate controlled AF (HR 72), no ischemic changes. Reviewed all prior ECGs, no prior AF, usually NSR/sinus brady (HR 50-60s) with occasional PVCs  Relevant CV Studies:  CTA Result date: 02/24/21 1. No evidence of pulmonary embolism. 2. Findings of congestive heart failure, including cardiomegaly, bilateral pleural effusions, and contrast reflux into the IVC and hepatic veins. Latter findings suggest elevated right heart pressures. 3. Cholelithiasis. 4. Aortic Atherosclerosis (ICD10-I70.0).  Abd aortogram Result date: 09/22/18 1.  No significant aortoiliac disease. 2.  Right lower extremity: Mild SFA disease with two-vessel runoff below the knee with occluded posterior tibial artery. 3.  Left lower extremity: Mild SFA disease with  one-vessel runoff below the knee via the peroneal artery.  US arterial ABI Result date: 08/20/18 Left ABI is 0.84 and compatible with mild peripheral arterial disease. Left lower extremity arterial disease is likely involving the runoff vessels based on the pressure gradient. Normal right ABI, measuring 1.07. No significant occlusive arterial disease in the right lower extremity.  Laboratory Data:  High Sensitivity Troponin:   Recent Labs  Lab 02/24/21 1142 02/24/21 1342  TROPONINIHS 14 14      Chemistry Recent Labs  Lab 02/24/21 1142  NA 135  K 5.0  CL 102  CO2 22  GLUCOSE 192*  BUN 19  CREATININE 1.15  CALCIUM 9.1  GFRNONAA >60  ANIONGAP 11    No results for input(s): PROT, ALBUMIN, AST, ALT, ALKPHOS, BILITOT in the last 168 hours. Hematology Recent Labs  Lab 02/24/21 1142  WBC 6.6  RBC 4.08*  HGB 13.2  HCT 41.3  MCV 101.2*  MCH 32.4  MCHC 32.0  RDW 15.8*  PLT 239   BNP Recent Labs  Lab 02/24/21 1342  BNP 1,394.6*    DDimer No results for input(s): DDIMER in the last 168 hours.  Radiology/Studies:  DG Chest 2 View  Result Date: 02/24/2021 CLINICAL DATA:  Shortness of breath for 2 days. EXAM: CHEST - 2 VIEW COMPARISON:  PA and lateral chest 12/04/2020. FINDINGS: The patient is status post CABG. Heart size is upper normal with mild vascular congestion. There small bilateral pleural effusions and basilar atelectasis. No consolidative process or pneumothorax. IMPRESSION: Mild pulmonary vascular congestion. Very small bilateral pleural effusions and basilar atelectasis. Electronically Signed   By: Drusilla Kanner M.D.   On: 02/24/2021 12:26   CT Angio Chest PE W and/or Wo Contrast  Result Date: 02/24/2021 CLINICAL DATA:  Shortness of breath. Worsening yesterday and today. Palpitations. Chest tightness. EXAM: CT ANGIOGRAPHY CHEST WITH CONTRAST TECHNIQUE: Multidetector CT imaging of the chest was performed using the standard protocol during bolus  administration of intravenous contrast. Multiplanar CT image reconstructions and MIPs were obtained to evaluate the vascular anatomy. CONTRAST:  OMNIPAQUE IOHEXOL 350 MG/ML SOLN COMPARISON:  Plain film of earlier today.  No prior CT. FINDINGS: Cardiovascular: The quality of this exam for evaluation of pulmonary embolism is good. The bolus is well timed. No evidence of pulmonary embolism. Aortic atherosclerosis. Aorta not well opacified secondary to bolus timing. Moderate cardiomegaly with right-sided chamber enlargement and contrast reflux into the IVC and hepatic veins. Median sternotomy for CABG. Mediastinum/Nodes: Multiple small mediastinal and and borderline enlarged right hilar node are likely secondary to fluid overload. Lungs/Pleura: Small bilateral pleural effusions, significantly more impressive than on plain film. Areas of smooth septal thickening within both lungs are relatively mild. Scattered areas of ground-glass in the perihilar lungs bilaterally. Upper Abdomen: Small dependent gallstones. Normal imaged portions of the spleen, stomach, pancreas, right adrenal gland, kidneys. Left adrenal thickening. Musculoskeletal: Prior median sternotomy. Review of the MIP images confirms the above findings. IMPRESSION: 1. No evidence of pulmonary embolism. 2. Findings of congestive heart failure, including cardiomegaly, bilateral pleural effusions, and contrast reflux into the IVC and hepatic veins. Latter findings suggest elevated right heart pressures. 3. Cholelithiasis. 4. Aortic Atherosclerosis (ICD10-I70.0). Electronically Signed   By: Jeronimo Greaves M.D.   On: 02/24/2021  16:35    Assessment and Plan:   pAF Micheal [name] presented with new onset symptomatic rate controlled AF.  He was already prescribed Toprol-XL 50 mg p.o. daily which she was already on for CAD.  He seems to be very symptomatic from his AF but does not have great rhythm awareness.  His initial lab work was generally unremarkable  except for an elevated BNP (1394) although he does not have signs of heart failure on exam.  I think that his elevated BNP is just in the setting of AF and hopefully he does not yet have TIC.  We discussed different management options for his AF including outpatient DCCV (+/- TEE depending on timing), rate/rhythm meds but not yet catheter ablation.  We also discussed inpatient evaluation with TTE and possible TEE/DCCV if scheduling permitted.  He did not have a ride home on 07/31 PM so I think it is reasonable to keep him for observation and TTE plus possible TEE/DCCV add on.  If unable to add on for cardioversion then can obtain TTE and scheduled for outpatient cardioversion. -CHA2DS2-VASc (4: >75, prior MI, DM2, HTN) - start eliquis 5 mg PO bid (FD 07/31 at 2110)   HTN Recently taken off his home Hyzaar for unknown reason per his report.  Will resume for now given that his blood pressures are not well controlled. - start back hyzaar 50/12.5 mg PO daily   HLD - continue pravastatin 20 mg PO qhs  - repeat lipid panel pending   DM2 - continue metformin 1000 mg PO qhs  - repeat A1c pending   Severity of Illness: The appropriate patient status for this patient is OBSERVATION. Observation status is judged to be reasonable and necessary in order to provide the required intensity of service to ensure the patient's safety. The patient's presenting symptoms, physical exam findings, and initial radiographic and laboratory data in the context of their medical condition is felt to place them at decreased risk for further clinical deterioration. Furthermore, it is anticipated that the patient will be medically stable for discharge from the hospital within 2 midnights of admission. The following factors support the patient status of observation.   " The patient's presenting symptoms include SOB/fatigue  " The physical exam findings include AF " The initial radiographic and laboratory data are elevated pro  BNP   For questions or updates, please contact [facility] Please consult [email or web address] for contact info under   Signed, Linton Rump, MD  02/25/2021 1:58 AM

## 2021-02-24 NOTE — ED Provider Notes (Signed)
Emergency Medicine Provider Triage Evaluation Note  Micheal [name] , [name] 80 y.o. male  was evaluated in triage.  Pt complains of sob. Started [name] month ago. Worse while playing golf yesterday and today. Increased palpitations. Some tightness however denies pain per se. No back pain, LE edema, PND, orthopnea. Recent dx with bronchitis within the last few weeks.  Follows with [facility]  Review of Systems  Positive: Cough sob, exertional, dyspnea, palpitations Negative: Back pain, LE edema, fever  Physical Exam  BP (!) 174/110 (BP Location: Left Arm)   Pulse 71   Temp 97.6 F (36.4 C)   Resp 16   SpO2 97%  Gen:   Awake, no distress   Resp:  Normal effort  MSK:   Moves extremities without difficulty, compartments sfot, No LE edema Other:    Medical Decision Making  Medically screening exam initiated at 11:42 AM.  Appropriate orders placed.  Micheal [name] was informed that the remainder of the evaluation will be completed by another provider, this initial triage assessment does not replace that evaluation, and the importance of remaining in the ED until their evaluation is complete.  Dyspnea on exertion, palpitations   Micheal [name] A, PA-C 02/24/21 1144    Terald Sleeper, MD 02/25/21 [phone]

## 2021-02-24 NOTE — ED Triage Notes (Addendum)
Reports intermittent heart racing and SOB x 2 months that has been progressively worse since Thursday.  Denies chest pain.  States he is unable to walk more than 30 ft without having to sit down to rest.  Reports non-productive cough for a few months.

## 2021-02-25 ENCOUNTER — Observation Stay (HOSPITAL_BASED_OUTPATIENT_CLINIC_OR_DEPARTMENT_OTHER): Payer: Medicare Other

## 2021-02-25 ENCOUNTER — Encounter (HOSPITAL_COMMUNITY): Payer: Self-pay | Admitting: Student in an Organized Health Care Education/Training Program

## 2021-02-25 DIAGNOSIS — I340 Nonrheumatic mitral (valve) insufficiency: Secondary | ICD-10-CM | POA: Diagnosis not present

## 2021-02-25 DIAGNOSIS — R0602 Shortness of breath: Secondary | ICD-10-CM | POA: Diagnosis present

## 2021-02-25 DIAGNOSIS — E7800 Pure hypercholesterolemia, unspecified: Secondary | ICD-10-CM | POA: Diagnosis not present

## 2021-02-25 DIAGNOSIS — I10 Essential (primary) hypertension: Secondary | ICD-10-CM

## 2021-02-25 DIAGNOSIS — I2510 Atherosclerotic heart disease of native coronary artery without angina pectoris: Secondary | ICD-10-CM | POA: Diagnosis not present

## 2021-02-25 DIAGNOSIS — I4891 Unspecified atrial fibrillation: Secondary | ICD-10-CM

## 2021-02-25 DIAGNOSIS — Z7982 Long term (current) use of aspirin: Secondary | ICD-10-CM | POA: Diagnosis not present

## 2021-02-25 DIAGNOSIS — I252 Old myocardial infarction: Secondary | ICD-10-CM | POA: Diagnosis not present

## 2021-02-25 DIAGNOSIS — Z87891 Personal history of nicotine dependence: Secondary | ICD-10-CM | POA: Diagnosis not present

## 2021-02-25 DIAGNOSIS — I110 Hypertensive heart disease with heart failure: Secondary | ICD-10-CM | POA: Diagnosis not present

## 2021-02-25 DIAGNOSIS — I480 Paroxysmal atrial fibrillation: Secondary | ICD-10-CM | POA: Diagnosis not present

## 2021-02-25 DIAGNOSIS — I509 Heart failure, unspecified: Secondary | ICD-10-CM

## 2021-02-25 DIAGNOSIS — Z20822 Contact with and (suspected) exposure to covid-19: Secondary | ICD-10-CM | POA: Diagnosis not present

## 2021-02-25 DIAGNOSIS — Z951 Presence of aortocoronary bypass graft: Secondary | ICD-10-CM | POA: Diagnosis not present

## 2021-02-25 DIAGNOSIS — Z8249 Family history of ischemic heart disease and other diseases of the circulatory system: Secondary | ICD-10-CM | POA: Diagnosis not present

## 2021-02-25 DIAGNOSIS — Z79899 Other long term (current) drug therapy: Secondary | ICD-10-CM | POA: Diagnosis not present

## 2021-02-25 DIAGNOSIS — E1151 Type 2 diabetes mellitus with diabetic peripheral angiopathy without gangrene: Secondary | ICD-10-CM | POA: Diagnosis not present

## 2021-02-25 DIAGNOSIS — Z7984 Long term (current) use of oral hypoglycemic drugs: Secondary | ICD-10-CM | POA: Diagnosis not present

## 2021-02-25 LAB — PROTIME-INR
INR: 1.4 — ABNORMAL HIGH (ref 0.8–1.2)
Prothrombin Time: 17.4 seconds — ABNORMAL HIGH (ref 11.4–15.2)

## 2021-02-25 LAB — BASIC METABOLIC PANEL
Anion gap: 12 (ref 5–15)
BUN: 22 mg/dL (ref 8–23)
CO2: 22 mmol/L (ref 22–32)
Calcium: 8.8 mg/dL — ABNORMAL LOW (ref 8.9–10.3)
Chloride: 103 mmol/L (ref 98–111)
Creatinine, Ser: 1.1 mg/dL (ref 0.61–1.24)
GFR, Estimated: >60 mL/min (ref >60)
Glucose, Bld: 123 mg/dL — ABNORMAL HIGH (ref 70–99)
Potassium: 4.4 mmol/L (ref 3.5–5.1)
Sodium: 137 mmol/L (ref 135–145)

## 2021-02-25 LAB — ECHOCARDIOGRAM COMPLETE
AR max vel: 2.29 cm2
AV Area VTI: 2.05 cm2
AV Area mean vel: 2.24 cm2
AV Mean grad: 2 mmHg
AV Peak grad: 3.3 mmHg
Ao pk vel: 0.91 m/s
Area-P 1/2: 3.08 cm2
Height: 68 in
S' Lateral: 2.5 cm
Weight: 2395.2 oz

## 2021-02-25 LAB — LIPID PANEL
Cholesterol: 130 mg/dL (ref 0–200)
HDL: 35 mg/dL — ABNORMAL LOW (ref >40)
LDL Cholesterol: 75 mg/dL (ref 0–99)
Total CHOL/HDL Ratio: 3.7 RATIO
Triglycerides: 102 mg/dL (ref <150)
VLDL: 20 mg/dL (ref 0–40)

## 2021-02-25 LAB — CBC
HCT: 39.2 % (ref 39.0–52.0)
Hemoglobin: 13.1 g/dL (ref 13.0–17.0)
MCH: 33.1 pg (ref 26.0–34.0)
MCHC: 33.4 g/dL (ref 30.0–36.0)
MCV: 99 fL (ref 80.0–100.0)
Platelets: 198 10*3/uL (ref 150–400)
RBC: 3.96 MIL/uL — ABNORMAL LOW (ref 4.22–5.81)
RDW: 15.8 % — ABNORMAL HIGH (ref 11.5–15.5)
WBC: 6.9 10*3/uL (ref 4.0–10.5)
nRBC: 0 % (ref 0.0–0.2)

## 2021-02-25 LAB — TSH: TSH: 1.897 u[IU]/mL (ref 0.350–4.500)

## 2021-02-25 LAB — HEMOGLOBIN A1C
Hgb A1c MFr Bld: 6.4 % — ABNORMAL HIGH (ref 4.8–5.6)
Mean Plasma Glucose: 136.98 mg/dL

## 2021-02-25 MED ORDER — ASPIRIN EC 81 MG PO TBEC
162.0000 mg | DELAYED_RELEASE_TABLET | Freq: Once | ORAL | Status: AC
  Administered 2021-02-25: 162 mg via ORAL
  Filled 2021-02-25: qty 2

## 2021-02-25 MED ORDER — ACETAMINOPHEN 325 MG PO TABS: 650.0000 mg | ORAL_TABLET | Freq: Four times a day (QID) | ORAL | Status: DC | PRN

## 2021-02-25 MED ORDER — SODIUM CHLORIDE 0.9 % IV SOLN
INTRAVENOUS | Status: DC
Start: 2021-02-25 — End: 2021-02-26

## 2021-02-25 NOTE — Progress Notes (Signed)
  Echocardiogram 2D Echocardiogram has been performed.  Micheal [name] 02/25/2021, 4:15 PM

## 2021-02-25 NOTE — Progress Notes (Signed)
   02/24/21 2329  Vitals  Temp 97.7 F (36.5 C)  Temp Source Oral  BP (!) 153/58  MAP (mmHg) 82  BP Location Right Arm  BP Method Automatic  Patient Position (if appropriate) Lying  Pulse Rate 76  Resp 19  Level of Consciousness  Level of Consciousness Alert  MEWS COLOR  MEWS Score Color Green  Oxygen Therapy  SpO2 92 %  O2 Device Room Air  Pain Assessment  Pain Scale 0-10  Pain Score 0  Height and Weight  Height 5\' 8"  (1.727 m)  Weight in (lb) to have BMI = 25 164.1  MEWS Score  MEWS Temp 0  MEWS Systolic 0  MEWS Pulse 0  MEWS RR 0  MEWS LOC 0  MEWS Score 0  Admitted pt to rm 3E18 from ED, pt is laert and oriented x 4, denies any pain at this time, oriented to room, call bell placed within reach. Placed on cardiac monitor, CCMD made awrae.

## 2021-02-25 NOTE — Progress Notes (Signed)
Progress Note  Patient Name: Micheal [name] Date of Encounter: 02/25/2021  [facility] Cardiologist: Lesleigh Noe, MD   Subjective   [name] reports he's doing well this morning, no changes since admission. Mentions dyspnea upon walking. Denies chest pain, palpitations, lower extremity swelling.  Inpatient Medications    Scheduled Meds:  apixaban  5 mg Oral BID   losartan  50 mg Oral Daily   And   hydrochlorothiazide  12.5 mg Oral Daily   [START ON 02/26/2021] metFORMIN  1,000 mg Oral QHS   metoprolol succinate  50 mg Oral QHS   pravastatin  20 mg Oral QHS   vitamin B-12  1,000 mcg Oral Daily   Continuous Infusions:  PRN Meds:    Vital Signs    Vitals:   02/24/21 2329 02/25/21 0234 02/25/21 0537 02/25/21 0747  BP: (!) 153/58  (!) 155/56 125/65  Pulse: 76  (!) 55 (!) 57  Resp: 19  17   Temp: 97.7 F (36.5 C)  97.9 F (36.6 C) 98.1 F (36.7 C)  TempSrc: Oral  Oral Oral  SpO2: 92%  93% 91%  Weight:  67.9 kg    Height: 5\' 8"  (1.727 m)       Intake/Output Summary (Last 24 hours) at 02/25/2021 0912 Last data filed at 02/25/2021 0719 Gross per 24 hour  Intake 240 ml  Output 750 ml  Net -510 ml   Last 3 Weights 02/25/2021 11/19/2020 02/28/2020  Weight (lbs) 149 lb 11.2 oz 159 lb 165 lb  Weight (kg) 67.903 kg 72.122 kg 74.844 kg      Telemetry    Rate-controlled atrial fibrillation - Personally Reviewed  ECG    Rate-controlled atrial fibrillation - Personally Reviewed  Physical Exam   GEN: No acute distress.   Neck: No JVD Cardiac: RRR, systolic murmur present, no rubs or gallops.  Respiratory: Clear to auscultation bilaterally. GI: Soft, nontender, non-distended  MS: No edema; No deformity. Neuro:  Nonfocal  Psych: Normal affect   Labs    High Sensitivity Troponin:   Recent Labs  Lab 02/24/21 1142 02/24/21 1342  TROPONINIHS 14 14      Chemistry Recent Labs  Lab 02/24/21 1142 02/25/21 0249  NA 135 137  K 5.0 4.4  CL 102 103   CO2 22 22  GLUCOSE 192* 123*  BUN 19 22  CREATININE 1.15 1.10  CALCIUM 9.1 8.8*  GFRNONAA >60 >60  ANIONGAP 11 12     Hematology Recent Labs  Lab 02/24/21 1142 02/25/21 0249  WBC 6.6 6.9  RBC 4.08* 3.96*  HGB 13.2 13.1  HCT 41.3 39.2  MCV 101.2* 99.0  MCH 32.4 33.1  MCHC 32.0 33.4  RDW 15.8* 15.8*  PLT 239 198    BNP Recent Labs  Lab 02/24/21 1342  BNP 1,394.6*     DDimer No results for input(s): DDIMER in the last 168 hours.   Radiology    DG Chest 2 View  Result Date: 02/24/2021 CLINICAL DATA:  Shortness of breath for 2 days. EXAM: CHEST - 2 VIEW COMPARISON:  PA and lateral chest 12/04/2020. FINDINGS: The patient is status post CABG. Heart size is upper normal with mild vascular congestion. There small bilateral pleural effusions and basilar atelectasis. No consolidative process or pneumothorax. IMPRESSION: Mild pulmonary vascular congestion. Very small bilateral pleural effusions and basilar atelectasis. Electronically Signed   By: 02/03/2021 M.D.   On: 02/24/2021 12:26   CT Angio Chest PE W and/or Wo  Contrast  Result Date: 02/24/2021 CLINICAL DATA:  Shortness of breath. Worsening yesterday and today. Palpitations. Chest tightness. EXAM: CT ANGIOGRAPHY CHEST WITH CONTRAST TECHNIQUE: Multidetector CT imaging of the chest was performed using the standard protocol during bolus administration of intravenous contrast. Multiplanar CT image reconstructions and MIPs were obtained to evaluate the vascular anatomy. CONTRAST:  OMNIPAQUE IOHEXOL 350 MG/ML SOLN COMPARISON:  Plain film of earlier today.  No prior CT. FINDINGS: Cardiovascular: The quality of this exam for evaluation of pulmonary embolism is good. The bolus is well timed. No evidence of pulmonary embolism. Aortic atherosclerosis. Aorta not well opacified secondary to bolus timing. Moderate cardiomegaly with right-sided chamber enlargement and contrast reflux into the IVC and hepatic veins. Median sternotomy  for CABG. Mediastinum/Nodes: Multiple small mediastinal and and borderline enlarged right hilar node are likely secondary to fluid overload. Lungs/Pleura: Small bilateral pleural effusions, significantly more impressive than on plain film. Areas of smooth septal thickening within both lungs are relatively mild. Scattered areas of ground-glass in the perihilar lungs bilaterally. Upper Abdomen: Small dependent gallstones. Normal imaged portions of the spleen, stomach, pancreas, right adrenal gland, kidneys. Left adrenal thickening. Musculoskeletal: Prior median sternotomy. Review of the MIP images confirms the above findings. IMPRESSION: 1. No evidence of pulmonary embolism. 2. Findings of congestive heart failure, including cardiomegaly, bilateral pleural effusions, and contrast reflux into the IVC and hepatic veins. Latter findings suggest elevated right heart pressures. 3. Cholelithiasis. 4. Aortic Atherosclerosis (ICD10-I70.0). Electronically Signed   By: Jeronimo Greaves M.D.   On: 02/24/2021 16:35    Cardiac Studies   N/a  Patient Profile     80 y.o. male with CAD s/p CABG, T2DM, HLD, HTN admitted with new-onset symptomatic atrial fibrillation.  Assessment & Plan    New-onset atrial fibrillation No acute changes overnight. Continues to be rate-controlled on metoprolol. Eliquis initiated upon admission given CHADs-VASc 4 (age, prior MI, T2DM, HTN). New murmur appreciated on exam today, no previous history per chart review. Today will plan to obtain TTE today and schedule TEE/DCCV tomorrow. - Metoprolol succinate 50mg  daily - Eliquis 5mg  po bid - TTE today - TEE/DCCV scheduled 8/2 9:00AM  2. Hypertension Re-starting home medication. No acute changes - Hyzaar 50/12.5mg  po daily  3. Hyperlipidemia LDL 75 on lipid panel today, will continue with pravastatin - Pravastatin 20mg  po QHS  4. Type 2 DM - A1c 6.4% - Continue metformin 1000mg  po QHS  For questions or updates, please contact [facility] Please consult [email or web address] for contact info under    Signed, , MD  02/25/2021, 9:12 AM

## 2021-02-25 NOTE — H&P (View-Only) (Signed)
Progress Note  Patient Name: Micheal [name] Date of Encounter: 02/25/2021  [facility] Cardiologist: Lesleigh Noe, MD   Subjective   Micheal [name] reports he's doing well this morning, no changes since admission. Mentions dyspnea upon walking. Denies chest pain, palpitations, lower extremity swelling.  Inpatient Medications    Scheduled Meds:  apixaban  5 mg Oral BID   losartan  50 mg Oral Daily   And   hydrochlorothiazide  12.5 mg Oral Daily   [START ON 02/26/2021] metFORMIN  1,000 mg Oral QHS   metoprolol succinate  50 mg Oral QHS   pravastatin  20 mg Oral QHS   vitamin B-12  1,000 mcg Oral Daily   Continuous Infusions:  PRN Meds:    Vital Signs    Vitals:   02/24/21 2329 02/25/21 0234 02/25/21 0537 02/25/21 0747  BP: (!) 153/58  (!) 155/56 125/65  Pulse: 76  (!) 55 (!) 57  Resp: 19  17   Temp: 97.7 F (36.5 C)  97.9 F (36.6 C) 98.1 F (36.7 C)  TempSrc: Oral  Oral Oral  SpO2: 92%  93% 91%  Weight:  67.9 kg    Height: 5\' 8"  (1.727 m)       Intake/Output Summary (Last 24 hours) at 02/25/2021 0912 Last data filed at 02/25/2021 0719 Gross per 24 hour  Intake 240 ml  Output 750 ml  Net -510 ml   Last 3 Weights 02/25/2021 11/19/2020 02/28/2020  Weight (lbs) 149 lb 11.2 oz 159 lb 165 lb  Weight (kg) 67.903 kg 72.122 kg 74.844 kg      Telemetry    Rate-controlled atrial fibrillation - Personally Reviewed  ECG    Rate-controlled atrial fibrillation - Personally Reviewed  Physical Exam   GEN: No acute distress.   Neck: No JVD Cardiac: RRR, systolic murmur present, no rubs or gallops.  Respiratory: Clear to auscultation bilaterally. GI: Soft, nontender, non-distended  MS: No edema; No deformity. Neuro:  Nonfocal  Psych: Normal affect   Labs    High Sensitivity Troponin:   Recent Labs  Lab 02/24/21 1142 02/24/21 1342  TROPONINIHS 14 14      Chemistry Recent Labs  Lab 02/24/21 1142 02/25/21 0249  NA 135 137  K 5.0 4.4  CL 102 103   CO2 22 22  GLUCOSE 192* 123*  BUN 19 22  CREATININE 1.15 1.10  CALCIUM 9.1 8.8*  GFRNONAA >60 >60  ANIONGAP 11 12     Hematology Recent Labs  Lab 02/24/21 1142 02/25/21 0249  WBC 6.6 6.9  RBC 4.08* 3.96*  HGB 13.2 13.1  HCT 41.3 39.2  MCV 101.2* 99.0  MCH 32.4 33.1  MCHC 32.0 33.4  RDW 15.8* 15.8*  PLT 239 198    BNP Recent Labs  Lab 02/24/21 1342  BNP 1,394.6*     DDimer No results for input(s): DDIMER in the last 168 hours.   Radiology    DG Chest 2 View  Result Date: 02/24/2021 CLINICAL DATA:  Shortness of breath for 2 days. EXAM: CHEST - 2 VIEW COMPARISON:  PA and lateral chest 12/04/2020. FINDINGS: The patient is status post CABG. Heart size is upper normal with mild vascular congestion. There small bilateral pleural effusions and basilar atelectasis. No consolidative process or pneumothorax. IMPRESSION: Mild pulmonary vascular congestion. Very small bilateral pleural effusions and basilar atelectasis. Electronically Signed   By: 02/03/2021 M.D.   On: 02/24/2021 12:26   CT Angio Chest PE W and/or Wo  Contrast  Result Date: 02/24/2021 CLINICAL DATA:  Shortness of breath. Worsening yesterday and today. Palpitations. Chest tightness. EXAM: CT ANGIOGRAPHY CHEST WITH CONTRAST TECHNIQUE: Multidetector CT imaging of the chest was performed using the standard protocol during bolus administration of intravenous contrast. Multiplanar CT image reconstructions and MIPs were obtained to evaluate the vascular anatomy. CONTRAST:  100mL OMNIPAQUE IOHEXOL 350 MG/ML SOLN COMPARISON:  Plain film of earlier today.  No prior CT. FINDINGS: Cardiovascular: The quality of this exam for evaluation of pulmonary embolism is good. The bolus is well timed. No evidence of pulmonary embolism. Aortic atherosclerosis. Aorta not well opacified secondary to bolus timing. Moderate cardiomegaly with right-sided chamber enlargement and contrast reflux into the IVC and hepatic veins. Median sternotomy  for CABG. Mediastinum/Nodes: Multiple small mediastinal and and borderline enlarged right hilar node are likely secondary to fluid overload. Lungs/Pleura: Small bilateral pleural effusions, significantly more impressive than on plain film. Areas of smooth septal thickening within both lungs are relatively mild. Scattered areas of ground-glass in the perihilar lungs bilaterally. Upper Abdomen: Small dependent gallstones. Normal imaged portions of the spleen, stomach, pancreas, right adrenal gland, kidneys. Left adrenal thickening. Musculoskeletal: Prior median sternotomy. Review of the MIP images confirms the above findings. IMPRESSION: 1. No evidence of pulmonary embolism. 2. Findings of congestive heart failure, including cardiomegaly, bilateral pleural effusions, and contrast reflux into the IVC and hepatic veins. Latter findings suggest elevated right heart pressures. 3. Cholelithiasis. 4. Aortic Atherosclerosis (ICD10-I70.0). Electronically Signed   By: Kyle  Talbot M.D.   On: 02/24/2021 16:35    Cardiac Studies   N/a  Patient Profile     80 y.o. male with CAD s/p CABG, T2DM, HLD, HTN admitted with new-onset symptomatic atrial fibrillation.  Assessment & Plan    New-onset atrial fibrillation No acute changes overnight. Continues to be rate-controlled on metoprolol. Eliquis initiated upon admission given CHADs-VASc 4 (age, prior MI, T2DM, HTN). New murmur appreciated on exam today, no previous history per chart review. Today will plan to obtain TTE today and schedule TEE/DCCV tomorrow. - Metoprolol succinate 50mg daily - Eliquis 5mg po bid - TTE today - TEE/DCCV scheduled 8/2 9:00AM  2. Hypertension Re-starting home medication. No acute changes - Hyzaar 50/12.5mg po daily  3. Hyperlipidemia LDL 75 on lipid panel today, will continue with pravastatin - Pravastatin 20mg po QHS  4. Type 2 DM - A1c 6.4% - Continue metformin 1000mg po QHS  For questions or updates, please contact [facility] Please consult [email or web address] for contact info under    Signed, Floella Ensz, MD  02/25/2021, 9:12 AM    

## 2021-02-26 ENCOUNTER — Other Ambulatory Visit (HOSPITAL_COMMUNITY): Payer: Self-pay

## 2021-02-26 ENCOUNTER — Observation Stay (HOSPITAL_COMMUNITY): Payer: Medicare Other | Admitting: Certified Registered Nurse Anesthetist

## 2021-02-26 ENCOUNTER — Observation Stay (HOSPITAL_BASED_OUTPATIENT_CLINIC_OR_DEPARTMENT_OTHER): Payer: Medicare Other

## 2021-02-26 ENCOUNTER — Encounter (HOSPITAL_COMMUNITY)
Admission: EM | Disposition: A | Discharge status: Home / Self Care | Payer: Self-pay | Source: Home / Self Care | Attending: Internal Medicine

## 2021-02-26 DIAGNOSIS — I4819 Other persistent atrial fibrillation: Secondary | ICD-10-CM | POA: Diagnosis not present

## 2021-02-26 DIAGNOSIS — E782 Mixed hyperlipidemia: Secondary | ICD-10-CM | POA: Diagnosis not present

## 2021-02-26 DIAGNOSIS — I110 Hypertensive heart disease with heart failure: Secondary | ICD-10-CM | POA: Diagnosis not present

## 2021-02-26 DIAGNOSIS — I4891 Unspecified atrial fibrillation: Secondary | ICD-10-CM | POA: Diagnosis not present

## 2021-02-26 DIAGNOSIS — Z20822 Contact with and (suspected) exposure to covid-19: Secondary | ICD-10-CM | POA: Diagnosis not present

## 2021-02-26 DIAGNOSIS — I340 Nonrheumatic mitral (valve) insufficiency: Secondary | ICD-10-CM | POA: Diagnosis not present

## 2021-02-26 DIAGNOSIS — I509 Heart failure, unspecified: Secondary | ICD-10-CM | POA: Diagnosis not present

## 2021-02-26 DIAGNOSIS — R0602 Shortness of breath: Secondary | ICD-10-CM | POA: Diagnosis not present

## 2021-02-26 DIAGNOSIS — I361 Nonrheumatic tricuspid (valve) insufficiency: Secondary | ICD-10-CM

## 2021-02-26 DIAGNOSIS — I2510 Atherosclerotic heart disease of native coronary artery without angina pectoris: Secondary | ICD-10-CM | POA: Diagnosis not present

## 2021-02-26 DIAGNOSIS — E1151 Type 2 diabetes mellitus with diabetic peripheral angiopathy without gangrene: Secondary | ICD-10-CM | POA: Diagnosis not present

## 2021-02-26 DIAGNOSIS — I081 Rheumatic disorders of both mitral and tricuspid valves: Secondary | ICD-10-CM | POA: Diagnosis not present

## 2021-02-26 DIAGNOSIS — I10 Essential (primary) hypertension: Secondary | ICD-10-CM | POA: Diagnosis not present

## 2021-02-26 HISTORY — PX: TEE WITHOUT CARDIOVERSION: SHX5443

## 2021-02-26 HISTORY — PX: CARDIOVERSION: SHX1299

## 2021-02-26 LAB — CBC
HCT: 36.8 % — ABNORMAL LOW (ref 39.0–52.0)
Hemoglobin: 12.3 g/dL — ABNORMAL LOW (ref 13.0–17.0)
MCH: 32.6 pg (ref 26.0–34.0)
MCHC: 33.4 g/dL (ref 30.0–36.0)
MCV: 97.6 fL (ref 80.0–100.0)
Platelets: 204 10*3/uL (ref 150–400)
RBC: 3.77 MIL/uL — ABNORMAL LOW (ref 4.22–5.81)
RDW: 15.9 % — ABNORMAL HIGH (ref 11.5–15.5)
WBC: 7.4 10*3/uL (ref 4.0–10.5)
nRBC: 0 % (ref 0.0–0.2)

## 2021-02-26 LAB — ECHO TEE: Radius: 0.4 cm

## 2021-02-26 LAB — GLUCOSE, CAPILLARY: Glucose-Capillary: 127 mg/dL — ABNORMAL HIGH (ref 70–99)

## 2021-02-26 SURGERY — ECHOCARDIOGRAM, TRANSESOPHAGEAL
Anesthesia: Monitor Anesthesia Care

## 2021-02-26 MED ORDER — APIXABAN 5 MG PO TABS
5.0000 mg | ORAL_TABLET | Freq: Two times a day (BID) | ORAL | 2 refills | Status: DC
  Filled 2021-02-26: qty 60, 30d supply, fill #0

## 2021-02-26 MED ORDER — APIXABAN 5 MG PO TABS
5.0000 mg | ORAL_TABLET | Freq: Two times a day (BID) | ORAL | Status: AC
  Administered 2021-02-26: 5 mg via ORAL

## 2021-02-26 MED ORDER — PROPOFOL 10 MG/ML IV BOLUS
INTRAVENOUS | Status: DC | PRN
  Administered 2021-02-26: 20 mg via INTRAVENOUS
  Administered 2021-02-26: 10 mg via INTRAVENOUS

## 2021-02-26 MED ORDER — PROPOFOL 500 MG/50ML IV EMUL
INTRAVENOUS | Status: DC | PRN
  Administered 2021-02-26: 125 ug/kg/min via INTRAVENOUS

## 2021-02-26 MED ORDER — APIXABAN 5 MG PO TABS: 5.0000 mg | ORAL_TABLET | Freq: Two times a day (BID) | ORAL | 2 refills | Status: DC

## 2021-02-26 MED ORDER — LIDOCAINE 2% (20 MG/ML) 5 ML SYRINGE
INTRAMUSCULAR | Status: DC | PRN
  Administered 2021-02-26: 60 mg via INTRAVENOUS

## 2021-02-26 NOTE — Progress Notes (Signed)
  Echocardiogram Echocardiogram Transesophageal has been performed.  Micheal [name] [name] 02/26/2021, 10:29 AM

## 2021-02-26 NOTE — TOC Transition Note (Signed)
Transition of Care [facility]) - CM/SW Discharge Note   Patient Details  Name: Micheal [name] MRN: [id number] Date of Birth: 16-Aug-1940  [facility] CM/SW Contact:  Leone Haven, RN Phone Number: 02/26/2021, 4:27 PM   Clinical Narrative:    NCM spoke with patient, TOC has filled the first 30 days for patient eliquis. NCM informed patient that it will cost 200.00 due to him being [place] the [place] per [facility].  Patient states this is ok with him he can pay that amount.   Final next level of care: Home/Self Care Barriers to Discharge: No Barriers Identified   Patient Goals and CMS Choice Patient states their goals for this hospitalization and ongoing recovery are:: return home      Discharge Placement                       Discharge Plan and Services                                     Social Determinants of Health (SDOH) Interventions     Readmission Risk Interventions No flowsheet data found.

## 2021-02-26 NOTE — Discharge Summary (Signed)
Discharge Summary    Patient ID: Micheal [name] MRN: [id number]; DOB: 08-Feb-1941  Admit date: 02/24/2021 Discharge date: 02/26/2021  PCP:  Lorenda IshiharaVaradarajan, Rupashree, MD   [facility] Providers Cardiologist:  Lesleigh NoeHenry W Smith III, MD        Discharge Diagnoses    Principal Problem:   New onset atrial fibrillation [facility](HCC) Active Problems:   HTN (hypertension)   Hyperlipidemia   Diabetes (HCC)   Congestive heart failure (HCC)   Diagnostic Studies/Procedures    TTE 02/25/2021: 1. Left ventricular ejection fraction, by estimation, is 60 to 65%. The  left ventricle has normal function. The left ventricle has no regional  wall motion abnormalities. There is moderate left ventricular hypertrophy.  Left ventricular diastolic  parameters are indeterminate.   2. Right ventricular systolic function is moderately reduced. The right  ventricular size is mildly enlarged. There is severely elevated pulmonary  artery systolic pressure. The estimated right ventricular systolic  pressure is 68.0 mmHg.   3. Left atrial size was severely dilated.   4. Right atrial size was severely dilated.   5. The mitral valve is abnormal. Bileaflet prolapse. Moderate to severe  mitral valve regurgitation.   6. Tricuspid valve regurgitation is mild to moderate.   7. The aortic valve is tricuspid. Aortic valve regurgitation is not  visualized. No aortic stenosis is present.   8. Pulmonic valve regurgitation is mild to moderate.   9. The inferior vena cava is dilated in size with <50% respiratory  variability, suggesting right atrial pressure of 15 mmHg.   TEE and successful DCCV on 02/26/2021   History of Present Illness     Micheal LuoJohn F [name] is a 80 y.o. male with CAD s/p CABG, hyperlipidemia, hypertension, type 2 diabetes mellitus admitted with new-onset atrial fibrillation due to underlying structural heart disease.  Hospital Course     Consultants: n/a   Mr. Janee MornJohn [name] presented to ED with fatigue,  dyspnea, and complaints of elevated heart rate with minimal exertion. He was found to be in rate-controlled, new-onset atrial fibrillation on home beta blocker. Mildly elevated BNP on admission, thought most likely to be 2/2 atrial fibrillation, especially as he was euvolemic on exam. After discussions with patient, it was decided that he would be admitted for Echo and possible cardioversion. With CHA2DS2-VASc 4, Eliquis was initiated in addition to continuing home beta blocker. Lipid panel revealed LDL 75 and was continued on pravastatin. On 8/1, TTE revealed severely dilated atria with moderate-severe MR due to prolapsed mitral leaflets. Most likely this structural abnormality is causing new-onset atrial fibrillation. [name] underwent successful TEE/DCCV on morning of discharge. Discussed with EP, will hold further antiarrhythmics at this time. We have discussed with [name] that he should follow-up with his cardiologist, Dr. [name], in addition to the structural heart disease clinic.  Did the patient have an acute coronary syndrome (MI, NSTEMI, STEMI, etc) this admission?:  No                               Did the patient have a percutaneous coronary intervention (stent / angioplasty)?:  No.   ________  Discharge Vitals Blood pressure 127/81, pulse (!) 58, temperature 98.1 F (36.7 C), temperature source Oral, resp. rate 18, height 5\' 8"  (1.727 m), weight 66.4 kg, SpO2 98 %.  Filed Weights   02/25/21 0234 02/25/21 2024 02/26/21 0500  Weight: 67.9 kg 66.4 kg 66.4 kg    Labs &  Radiologic Studies    CBC Recent Labs    02/24/21 1142 02/25/21 0249 02/26/21 0359  WBC 6.6 6.9 7.4  NEUTROABS 4.5  --   --   HGB 13.2 13.1 12.3*  HCT 41.3 39.2 36.8*  MCV 101.2* 99.0 97.6  PLT 239 198 204   Basic Metabolic Panel Recent Labs    23/30/07 1142 02/25/21 0249  NA 135 137  K 5.0 4.4  CL 102 103  CO2 22 22  GLUCOSE 192* 123*  BUN 19 22  CREATININE 1.15 1.10  CALCIUM 9.1 8.8*    Liver Function Tests No results for input(s): AST, ALT, ALKPHOS, BILITOT, PROT, ALBUMIN in the last 72 hours. No results for input(s): LIPASE, AMYLASE in the last 72 hours. High Sensitivity Troponin:   Recent Labs  Lab 02/24/21 1142 02/24/21 1342  TROPONINIHS 14 14    BNP Invalid input(s): POCBNP D-Dimer No results for input(s): DDIMER in the last 72 hours. Hemoglobin A1C Recent Labs    02/25/21 0249  HGBA1C 6.4*   Fasting Lipid Panel Recent Labs    02/25/21 0249  CHOL 130  HDL 35*  LDLCALC 75  TRIG 622  CHOLHDL 3.7   Thyroid Function Tests Recent Labs    02/25/21 0249  TSH 1.897   _____________  DG Chest 2 View  Result Date: 02/24/2021 CLINICAL DATA:  Shortness of breath for 2 days. EXAM: CHEST - 2 VIEW COMPARISON:  PA and lateral chest 12/04/2020. FINDINGS: The patient is status post CABG. Heart size is upper normal with mild vascular congestion. There small bilateral pleural effusions and basilar atelectasis. No consolidative process or pneumothorax. IMPRESSION: Mild pulmonary vascular congestion. Very small bilateral pleural effusions and basilar atelectasis. Electronically Signed   By: Drusilla Kanner M.D.   On: 02/24/2021 12:26   CT Angio Chest PE W and/or Wo Contrast  Result Date: 02/24/2021 CLINICAL DATA:  Shortness of breath. Worsening yesterday and today. Palpitations. Chest tightness. EXAM: CT ANGIOGRAPHY CHEST WITH CONTRAST TECHNIQUE: Multidetector CT imaging of the chest was performed using the standard protocol during bolus administration of intravenous contrast. Multiplanar CT image reconstructions and MIPs were obtained to evaluate the vascular anatomy. CONTRAST:  OMNIPAQUE IOHEXOL 350 MG/ML SOLN COMPARISON:  Plain film of earlier today.  No prior CT. FINDINGS: Cardiovascular: The quality of this exam for evaluation of pulmonary embolism is good. The bolus is well timed. No evidence of pulmonary embolism. Aortic atherosclerosis. Aorta not well  opacified secondary to bolus timing. Moderate cardiomegaly with right-sided chamber enlargement and contrast reflux into the IVC and hepatic veins. Median sternotomy for CABG. Mediastinum/Nodes: Multiple small mediastinal and and borderline enlarged right hilar node are likely secondary to fluid overload. Lungs/Pleura: Small bilateral pleural effusions, significantly more impressive than on plain film. Areas of smooth septal thickening within both lungs are relatively mild. Scattered areas of ground-glass in the perihilar lungs bilaterally. Upper Abdomen: Small dependent gallstones. Normal imaged portions of the spleen, stomach, pancreas, right adrenal gland, kidneys. Left adrenal thickening. Musculoskeletal: Prior median sternotomy. Review of the MIP images confirms the above findings. IMPRESSION: 1. No evidence of pulmonary embolism. 2. Findings of congestive heart failure, including cardiomegaly, bilateral pleural effusions, and contrast reflux into the IVC and hepatic veins. Latter findings suggest elevated right heart pressures. 3. Cholelithiasis. 4. Aortic Atherosclerosis (ICD10-I70.0). Electronically Signed   By: Jeronimo Greaves M.D.   On: 02/24/2021 16:35   ECHOCARDIOGRAM COMPLETE  Result Date: 02/25/2021    ECHOCARDIOGRAM REPORT   Patient Name:  Micheal [name] Date of Exam: 02/25/2021 Medical Rec #:  [id number]       Height:       68.0 in Accession #:    [id number]      Weight:       149.7 lb Date of Birth:  May 30, 1941       BSA:          1.807 m Patient Age:    80 years        BP:           146/70 mmHg Patient Gender: M               HR:           66 bpm. Exam Location:  Inpatient Procedure: 2D Echo, Color Doppler and Cardiac Doppler Indications:    Atrial fibrillation  History:        Patient has no prior history of Echocardiogram examinations.                 CHF, Prior CABG, Signs/Symptoms:Shortness of Breath; Risk                 Factors:Hypertension, Diabetes and Dyslipidemia. DOE.  Sonographer:     Ross Ludwig RDCS (AE) Referring Phys: [id number] MATTHEW A CARLISLE IMPRESSIONS  1. Left ventricular ejection fraction, by estimation, is 60 to 65%. The left ventricle has normal function. The left ventricle has no regional wall motion abnormalities. There is moderate left ventricular hypertrophy. Left ventricular diastolic parameters are indeterminate.  2. Right ventricular systolic function is moderately reduced. The right ventricular size is mildly enlarged. There is severely elevated pulmonary artery systolic pressure. The estimated right ventricular systolic pressure is 68.0 mmHg.  3. Left atrial size was severely dilated.  4. Right atrial size was severely dilated.  5. The mitral valve is abnormal. Bileaflet prolapse. Moderate to severe mitral valve regurgitation.  6. Tricuspid valve regurgitation is mild to moderate.  7. The aortic valve is tricuspid. Aortic valve regurgitation is not visualized. No aortic stenosis is present.  8. Pulmonic valve regurgitation is mild to moderate.  9. The inferior vena cava is dilated in size with <50% respiratory variability, suggesting right atrial pressure of 15 mmHg. FINDINGS  Left Ventricle: Left ventricular ejection fraction, by estimation, is 60 to 65%. The left ventricle has normal function. The left ventricle has no regional wall motion abnormalities. The left ventricular internal cavity size was normal in size. There is  moderate left ventricular hypertrophy. Left ventricular diastolic parameters are indeterminate. Right Ventricle: The right ventricular size is mildly enlarged. Right vetricular wall thickness was not well visualized. Right ventricular systolic function is moderately reduced. There is severely elevated pulmonary artery systolic pressure. The tricuspid regurgitant velocity is 3.64 m/s, and with an assumed right atrial pressure of 15 mmHg, the estimated right ventricular systolic pressure is 68.0 mmHg. Left Atrium: Left atrial size was severely dilated.  Right Atrium: Right atrial size was severely dilated. Pericardium: Trivial pericardial effusion is present. Mitral Valve: The mitral valve is abnormal. Moderate to severe mitral valve regurgitation. Tricuspid Valve: The tricuspid valve is normal in structure. Tricuspid valve regurgitation is mild to moderate. Aortic Valve: The aortic valve is tricuspid. Aortic valve regurgitation is not visualized. No aortic stenosis is present. Aortic valve mean gradient measures 2.0 mmHg. Aortic valve peak gradient measures 3.3 mmHg. Aortic valve area, by VTI measures 2.05 cm. Pulmonic Valve: The pulmonic valve was not well visualized. Pulmonic valve regurgitation is moderate. Aorta:  The aortic root and ascending aorta are structurally normal, with no evidence of dilitation. Venous: The inferior vena cava is dilated in size with less than 50% respiratory variability, suggesting right atrial pressure of 15 mmHg. IAS/Shunts: No atrial level shunt detected by color flow Doppler.  LEFT VENTRICLE PLAX 2D LVIDd:         4.00 cm LVIDs:         2.50 cm LV PW:         1.40 cm LV IVS:        1.70 cm LVOT diam:     2.10 cm LV SV:         41 LV SV Index:   22 LVOT Area:     3.46 cm  RIGHT VENTRICLE            IVC RV Basal diam:  3.80 cm    IVC diam: 2.60 cm RV Mid diam:    2.90 cm RV S prime:     5.63 cm/s TAPSE (M-mode): 1.0 cm LEFT ATRIUM            Index       RIGHT ATRIUM           Index LA diam:      4.30 cm  2.38 cm/m  RA Area:     25.50 cm LA Vol (A4C): 122.0 ml 67.52 ml/m RA Volume:   88.00 ml  48.70 ml/m  AORTIC VALVE AV Area (Vmax):    2.29 cm AV Area (Vmean):   2.24 cm AV Area (VTI):     2.05 cm AV Vmax:           90.60 cm/s AV Vmean:          58.300 cm/s AV VTI:            0.198 m AV Peak Grad:      3.3 mmHg AV Mean Grad:      2.0 mmHg LVOT Vmax:         60.00 cm/s LVOT Vmean:        37.700 cm/s LVOT VTI:          0.117 m LVOT/AV VTI ratio: 0.59  AORTA Ao Root diam: 3.60 cm Ao Asc diam:  3.20 cm MITRAL VALVE                 TRICUSPID VALVE MV Area (PHT): 3.08 cm     TR Peak grad:   53.0 mmHg MV Decel Time: 246 msec     TR Vmax:        364.00 cm/s MV E velocity: 138.00 cm/s                             SHUNTS                             Systemic VTI:  0.12 m                             Systemic Diam: 2.10 cm Epifanio Lesches MD Electronically signed by Epifanio Lesches MD Signature Date/Time: 02/25/2021/5:32:42 PM    Final    Disposition   Pt is being discharged home today in good condition.  Follow-up Plans & Appointments     Follow-up Information     Fenton, Levonne Spiller R, PA Follow up on 03/07/2021.  Specialty: Cardiology Why: Please arrive 15 minutes early for your 11am post-hospital appointment with the Afib clinic. You can park in the parking deck accessible from [employer] at [employer]. The parking code is Glass blower/designer information: [address] [place] [address] [phone]         Tonny Bollman, MD Follow up on 03/05/2021.   Specialty: Cardiology Why: Please arrive 15 minutes early for your 2:20pm appointment to discuss your mitral valve leakiness Contact information: [address] [place] [address] [phone]                  Discharge Medications   Allergies as of 02/26/2021   No Known Allergies      Medication List     STOP taking these medications    rosuvastatin 20 MG tablet Commonly known as: CRESTOR       TAKE these medications    apixaban 5 MG Tabs tablet Commonly known as: ELIQUIS Take 1 tablet (5 mg total) by mouth 2 (two) times daily.   CINNAMON PO Take 1,000 mg by mouth daily.   Co Q-10 200 MG Caps Take 200 mg by mouth daily.   dorzolamide-timolol 22.3-6.8 MG/ML ophthalmic solution Commonly known as: COSOPT Place 1 drop into both eyes 2 (two) times daily.   FISH OIL PO Take 1 capsule by mouth daily.   GLUCOSAMINE CHONDR COMPLEX PO Take 1 tablet by mouth daily.   L-Carnitine 500 MG Tabs Take 1 tablet by  mouth in the morning.   losartan-hydrochlorothiazide 50-12.5 MG tablet Commonly known as: HYZAAR TAKE 1 TABLET BY MOUTH EVERY DAY   metFORMIN 1000 MG tablet Commonly known as: GLUCOPHAGE Take 1,000 mg by mouth at bedtime.   metoprolol succinate 50 MG 24 hr tablet Commonly known as: TOPROL-XL TAKE 1 TABLET BY MOUTH EVERY DAY What changed: when to take this   Nac 600 600 MG Caps Generic drug: Acetylcysteine Take 1 capsule by mouth daily.   NF Formulas Testosterone Caps Take 1 capsule by mouth in the morning.   pravastatin 20 MG tablet Commonly known as: PRAVACHOL Take 20 mg by mouth at bedtime.   PRESERVISION AREDS 2 PO Take 1 capsule by mouth 2 (two) times daily.   QUERCETIN PO Take 1 tablet by mouth daily.   vitamin B-12 1000 MCG tablet Commonly known as: CYANOCOBALAMIN Take 1,000 mcg by mouth daily.   Vitamin D 50 MCG (2000 UT) tablet Take 2,000 Units by mouth in the morning and at bedtime.   vitamin E 180 MG (400 UNITS) capsule Take 400 Units by mouth daily.   Vyzulta 0.024 % Soln Generic drug: Latanoprostene Bunod Place 1 drop into the right eye at bedtime.         Outstanding Labs/Studies   None  Duration of Discharge Encounter   Greater than 30 minutes including physician time.  Signed, Evlyn Kanner, MD 02/26/2021, 2:08 PM

## 2021-02-26 NOTE — Transfer of Care (Signed)
Immediate Anesthesia Transfer of Care Note  Patient: Micheal [name]  Procedure(s) Performed: TRANSESOPHAGEAL ECHOCARDIOGRAM (TEE) CARDIOVERSION  Patient Location: Endoscopy Unit  Anesthesia Type:MAC  Level of Consciousness: drowsy and patient cooperative  Airway & Oxygen Therapy: Patient Spontanous Breathing and Patient connected to nasal cannula oxygen  Post-op Assessment: Report given to RN and Post -op Vital signs reviewed and stable  Post vital signs: Reviewed and stable  Last Vitals:  Vitals Value Taken Time  BP 114/72   Temp    Pulse 56 02/26/21 1001  Resp 16 02/26/21 1001  SpO2 95 % 02/26/21 1001  Vitals shown include unvalidated device data.  Last Pain:  Vitals:   02/26/21 0804  TempSrc: Oral  PainSc: 0-No pain         Complications: No notable events documented.

## 2021-02-26 NOTE — Anesthesia Postprocedure Evaluation (Addendum)
Anesthesia Post Note  Patient: Micheal [name]  Procedure(s) Performed: TRANSESOPHAGEAL ECHOCARDIOGRAM (TEE) CARDIOVERSION     Patient location during evaluation: Endoscopy Anesthesia Type: General Level of consciousness: awake and alert Pain management: pain level controlled Vital Signs Assessment: post-procedure vital signs reviewed and stable Respiratory status: spontaneous breathing, nonlabored ventilation, respiratory function stable and patient connected to nasal cannula oxygen Cardiovascular status: stable and blood pressure returned to baseline Postop Assessment: no apparent nausea or vomiting Anesthetic complications: no   No notable events documented.  Last Vitals:  Vitals:   02/26/21 1030 02/26/21 1125  BP: 125/83 127/81  Pulse:  (!) 58  Resp:  18  Temp:  36.7 C  SpO2:  98%    Last Pain:  Vitals:   02/26/21 1300  TempSrc:   PainSc: 0-No pain                 Caliope Ruppert

## 2021-02-26 NOTE — Interval H&P Note (Signed)
History and Physical Interval Note:  02/26/2021 8:53 AM  Micheal [name]  has presented today for surgery, with the diagnosis of afib.  The various methods of treatment have been discussed with the patient and family. After consideration of risks, benefits and other options for treatment, the patient has consented to  Procedure(s): TRANSESOPHAGEAL ECHOCARDIOGRAM (TEE) (N/A) CARDIOVERSION (N/A) as a surgical intervention.  The patient's history has been reviewed, patient examined, no change in status, stable for surgery.  I have reviewed the patient's chart and labs.  Questions were answered to the patient's satisfaction.     Kristeen Miss

## 2021-02-26 NOTE — Progress Notes (Signed)
Progress Note  Patient Name: Micheal [name] Date of Encounter: 02/26/2021  [facility] Cardiologist: Lesleigh Noe, MD   Subjective   Micheal [name] was evaluated after DCCV this morning. States he is doing well, denies chest pain or palpitations.   Inpatient Medications    Scheduled Meds:  [MAR Hold] apixaban  5 mg Oral BID   [MAR Hold] losartan  50 mg Oral Daily   And   [MAR Hold] hydrochlorothiazide  12.5 mg Oral Daily   [MAR Hold] metFORMIN  1,000 mg Oral QHS   [MAR Hold] metoprolol succinate  50 mg Oral QHS   [MAR Hold] pravastatin  20 mg Oral QHS   [MAR Hold] vitamin B-12  1,000 mcg Oral Daily   Continuous Infusions:  sodium chloride     PRN Meds: [MAR Hold] acetaminophen   Vital Signs    Vitals:   02/26/21 0420 02/26/21 0500 02/26/21 0717 02/26/21 0804  BP: (!) 143/67  117/73   Pulse: 68  (!) 55   Resp: 20  18   Temp: 98.7 F (37.1 C)  98.1 F (36.7 C) 98.2 F (36.8 C)  TempSrc: Oral  Oral Oral  SpO2: 96%  93%   Weight:  66.4 kg    Height:        Intake/Output Summary (Last 24 hours) at 02/26/2021 0953 Last data filed at 02/26/2021 0700 Gross per 24 hour  Intake 360 ml  Output 1750 ml  Net -1390 ml   Last 3 Weights 02/26/2021 02/25/2021 02/25/2021  Weight (lbs) 146 lb 6.4 oz 146 lb 6.4 oz 149 lb 11.2 oz  Weight (kg) 66.407 kg 66.407 kg 67.903 kg      Telemetry    Sinus rhythm with PAC's - Personally Reviewed  ECG    N/a - Personally Reviewed  Physical Exam   GEN: No acute distress.   Neck: No JVD Cardiac: RRR, systolic murmur present, rubs, or gallops.  Respiratory: Clear to auscultation bilaterally. GI: Soft, nontender, non-distended  MS: No edema; No deformity. Neuro:  Nonfocal  Psych: Normal affect   Labs    High Sensitivity Troponin:   Recent Labs  Lab 02/24/21 1142 02/24/21 1342  TROPONINIHS 14 14      Chemistry Recent Labs  Lab 02/24/21 1142 02/25/21 0249  NA 135 137  K 5.0 4.4  CL 102 103  CO2 22 22  GLUCOSE  192* 123*  BUN 19 22  CREATININE 1.15 1.10  CALCIUM 9.1 8.8*  GFRNONAA >60 >60  ANIONGAP 11 12     Hematology Recent Labs  Lab 02/24/21 1142 02/25/21 0249 02/26/21 0359  WBC 6.6 6.9 7.4  RBC 4.08* 3.96* 3.77*  HGB 13.2 13.1 12.3*  HCT 41.3 39.2 36.8*  MCV 101.2* 99.0 97.6  MCH 32.4 33.1 32.6  MCHC 32.0 33.4 33.4  RDW 15.8* 15.8* 15.9*  PLT 239 198 204    BNP Recent Labs  Lab 02/24/21 1342  BNP 1,394.6*     DDimer No results for input(s): DDIMER in the last 168 hours.   Radiology    DG Chest 2 View  Result Date: 02/24/2021 CLINICAL DATA:  Shortness of breath for 2 days. EXAM: CHEST - 2 VIEW COMPARISON:  PA and lateral chest 12/04/2020. FINDINGS: The patient is status post CABG. Heart size is upper normal with mild vascular congestion. There small bilateral pleural effusions and basilar atelectasis. No consolidative process or pneumothorax. IMPRESSION: Mild pulmonary vascular congestion. Very small bilateral pleural effusions and basilar atelectasis.  Electronically Signed   By: Drusilla Kanner M.D.   On: 02/24/2021 12:26   CT Angio Chest PE W and/or Wo Contrast  Result Date: 02/24/2021 CLINICAL DATA:  Shortness of breath. Worsening yesterday and today. Palpitations. Chest tightness. EXAM: CT ANGIOGRAPHY CHEST WITH CONTRAST TECHNIQUE: Multidetector CT imaging of the chest was performed using the standard protocol during bolus administration of intravenous contrast. Multiplanar CT image reconstructions and MIPs were obtained to evaluate the vascular anatomy. CONTRAST:  OMNIPAQUE IOHEXOL 350 MG/ML SOLN COMPARISON:  Plain film of earlier today.  No prior CT. FINDINGS: Cardiovascular: The quality of this exam for evaluation of pulmonary embolism is good. The bolus is well timed. No evidence of pulmonary embolism. Aortic atherosclerosis. Aorta not well opacified secondary to bolus timing. Moderate cardiomegaly with right-sided chamber enlargement and contrast reflux into the  IVC and hepatic veins. Median sternotomy for CABG. Mediastinum/Nodes: Multiple small mediastinal and and borderline enlarged right hilar node are likely secondary to fluid overload. Lungs/Pleura: Small bilateral pleural effusions, significantly more impressive than on plain film. Areas of smooth septal thickening within both lungs are relatively mild. Scattered areas of ground-glass in the perihilar lungs bilaterally. Upper Abdomen: Small dependent gallstones. Normal imaged portions of the spleen, stomach, pancreas, right adrenal gland, kidneys. Left adrenal thickening. Musculoskeletal: Prior median sternotomy. Review of the MIP images confirms the above findings. IMPRESSION: 1. No evidence of pulmonary embolism. 2. Findings of congestive heart failure, including cardiomegaly, bilateral pleural effusions, and contrast reflux into the IVC and hepatic veins. Latter findings suggest elevated right heart pressures. 3. Cholelithiasis. 4. Aortic Atherosclerosis (ICD10-I70.0). Electronically Signed   By: Jeronimo Greaves M.D.   On: 02/24/2021 16:35   ECHOCARDIOGRAM COMPLETE  Result Date: 02/25/2021    ECHOCARDIOGRAM REPORT   Patient Name:   Micheal [name] Date of Exam: 02/25/2021 Medical Rec #:  [id number]       Height:       68.0 in Accession #:    [id number]      Weight:       149.7 lb Date of Birth:  08-13-1940       BSA:          1.807 m Patient Age:    80 years        BP:           146/70 mmHg Patient Gender: M               HR:           66 bpm. Exam Location:  Inpatient Procedure: 2D Echo, Color Doppler and Cardiac Doppler Indications:    Atrial fibrillation  History:        Patient has no prior history of Echocardiogram examinations.                 CHF, Prior CABG, Signs/Symptoms:Shortness of Breath; Risk                 Factors:Hypertension, Diabetes and Dyslipidemia. DOE.  Sonographer:    Ross Ludwig RDCS (AE) Referring Phys: [id number] Micheal [name] IMPRESSIONS  1. Left ventricular ejection fraction, by  estimation, is 60 to 65%. The left ventricle has normal function. The left ventricle has no regional wall motion abnormalities. There is moderate left ventricular hypertrophy. Left ventricular diastolic parameters are indeterminate.  2. Right ventricular systolic function is moderately reduced. The right ventricular size is mildly enlarged. There is severely elevated pulmonary artery systolic pressure. The estimated right ventricular systolic pressure is  68.0 mmHg.  3. Left atrial size was severely dilated.  4. Right atrial size was severely dilated.  5. The mitral valve is abnormal. Bileaflet prolapse. Moderate to severe mitral valve regurgitation.  6. Tricuspid valve regurgitation is mild to moderate.  7. The aortic valve is tricuspid. Aortic valve regurgitation is not visualized. No aortic stenosis is present.  8. Pulmonic valve regurgitation is mild to moderate.  9. The inferior vena cava is dilated in size with <50% respiratory variability, suggesting right atrial pressure of 15 mmHg. FINDINGS  Left Ventricle: Left ventricular ejection fraction, by estimation, is 60 to 65%. The left ventricle has normal function. The left ventricle has no regional wall motion abnormalities. The left ventricular internal cavity size was normal in size. There is  moderate left ventricular hypertrophy. Left ventricular diastolic parameters are indeterminate. Right Ventricle: The right ventricular size is mildly enlarged. Right vetricular wall thickness was not well visualized. Right ventricular systolic function is moderately reduced. There is severely elevated pulmonary artery systolic pressure. The tricuspid regurgitant velocity is 3.64 m/s, and with an assumed right atrial pressure of 15 mmHg, the estimated right ventricular systolic pressure is 68.0 mmHg. Left Atrium: Left atrial size was severely dilated. Right Atrium: Right atrial size was severely dilated. Pericardium: Trivial pericardial effusion is present. Mitral Valve:  The mitral valve is abnormal. Moderate to severe mitral valve regurgitation. Tricuspid Valve: The tricuspid valve is normal in structure. Tricuspid valve regurgitation is mild to moderate. Aortic Valve: The aortic valve is tricuspid. Aortic valve regurgitation is not visualized. No aortic stenosis is present. Aortic valve mean gradient measures 2.0 mmHg. Aortic valve peak gradient measures 3.3 mmHg. Aortic valve area, by VTI measures 2.05 cm. Pulmonic Valve: The pulmonic valve was not well visualized. Pulmonic valve regurgitation is moderate. Aorta: The aortic root and ascending aorta are structurally normal, with no evidence of dilitation. Venous: The inferior vena cava is dilated in size with less than 50% respiratory variability, suggesting right atrial pressure of 15 mmHg. IAS/Shunts: No atrial level shunt detected by color flow Doppler.  LEFT VENTRICLE PLAX 2D LVIDd:         4.00 cm LVIDs:         2.50 cm LV PW:         1.40 cm LV IVS:        1.70 cm LVOT diam:     2.10 cm LV SV:         41 LV SV Index:   22 LVOT Area:     3.46 cm  RIGHT VENTRICLE            IVC RV Basal diam:  3.80 cm    IVC diam: 2.60 cm RV Mid diam:    2.90 cm RV S prime:     5.63 cm/s TAPSE (M-mode): 1.0 cm LEFT ATRIUM            Index       RIGHT ATRIUM           Index LA diam:      4.30 cm  2.38 cm/m  RA Area:     25.50 cm LA Vol (A4C): 122.0 ml 67.52 ml/m RA Volume:   88.00 ml  48.70 ml/m  AORTIC VALVE AV Area (Vmax):    2.29 cm AV Area (Vmean):   2.24 cm AV Area (VTI):     2.05 cm AV Vmax:           90.60 cm/s AV Vmean:  58.300 cm/s AV VTI:            0.198 m AV Peak Grad:      3.3 mmHg AV Mean Grad:      2.0 mmHg LVOT Vmax:         60.00 cm/s LVOT Vmean:        37.700 cm/s LVOT VTI:          0.117 m LVOT/AV VTI ratio: 0.59  AORTA Ao Root diam: 3.60 cm Ao Asc diam:  3.20 cm MITRAL VALVE                TRICUSPID VALVE MV Area (PHT): 3.08 cm     TR Peak grad:   53.0 mmHg MV Decel Time: 246 msec     TR Vmax:        364.00  cm/s MV E velocity: 138.00 cm/s                             SHUNTS                             Systemic VTI:  0.12 m                             Systemic Diam: 2.10 cm Epifanio Lescheshristopher Schumann MD Electronically signed by Epifanio Lescheshristopher Schumann MD Signature Date/Time: 02/25/2021/5:32:42 PM    Final     Cardiac Studies   Echo 02/25/2021:  1. Left ventricular ejection fraction, by estimation, is 60 to 65%. The  left ventricle has normal function. The left ventricle has no regional  wall motion abnormalities. There is moderate left ventricular hypertrophy. Left ventricular diastolic parameters are indeterminate.   2. Right ventricular systolic function is moderately reduced. The right  ventricular size is mildly enlarged. There is severely elevated pulmonary  artery systolic pressure. The estimated right ventricular systolic  pressure is 68.0 mmHg.   3. Left atrial size was severely dilated.   4. Right atrial size was severely dilated.   5. The mitral valve is abnormal. Bileaflet prolapse. Moderate to severe  mitral valve regurgitation.   6. Tricuspid valve regurgitation is mild to moderate.   7. The aortic valve is tricuspid. Aortic valve regurgitation is not  visualized. No aortic stenosis is present.   8. Pulmonic valve regurgitation is mild to moderate.   9. The inferior vena cava is dilated in size with <50% respiratory  variability, suggesting right atrial pressure of 15 mmHg  Patient Profile     80 y.o. male with CAD s/p CABG, hyperlipidemia, hypertension, type 2 diabetes mellitus admitted with new-onset atrial fibrillation due to underlying structural heart disease.   Assessment & Plan    New-onset symptomatic atrial fibrillation TTE yesterday revealed normal EF 60-65% with severely dilated RA, LA. This morning underwent TEE for further visualization and DCCV. Moderate-severe mitral regurgitation noted with severely dilated LA likely causing atrial fibrillation. Will need evaluation with  structural heart clinic for possible mitral clip vs replacement. Will continue with Eliquis (CHADS-VASc 4: age, prior MI, T2DM, HTN) and beta blocker. Plan to discuss with EP amiodarone vs dofetilide for further assistance in maintaining sinus rhythm. He is at high risk for atrial fibrillation in the future given underlying structural heart disease. Likely will be able to discharge within the next day or two. - Metoprolol succinate 50mg  daily -  Eliquis 5mg  po BID - TEE/DCCV today - Discuss further anti-arrhythmics with EP  2.   Hypertension Re-started home Hyzaar yesterday, continues to be normotensive.  - Continue home Hyzaar  3.   Hyperlipidemia LDL 75 yesterday, continue with pravastatin.  4.   Type 2 diabetes mellitus A1c 6.4%, sugars well-controlled. Continue on nightly metformin.  For questions or updates, please contact [facility] Please consult [email or web address] for contact info under   Signed, , MD  02/26/2021, 9:53 AM

## 2021-02-26 NOTE — Anesthesia Preprocedure Evaluation (Addendum)
Anesthesia Evaluation  Patient identified by MRN, date of birth, ID band Patient awake    Reviewed: Allergy & Precautions, NPO status , Patient's Chart, lab work & pertinent test results  History of Anesthesia Complications Negative for: history of anesthetic complications  Airway Mallampati: II  TM Distance: >3 FB Neck ROM: Full    Dental  (+) Dental Advisory Given, Teeth Intact   Pulmonary neg shortness of breath, neg sleep apnea, neg COPD, neg recent URI, former smoker,    breath sounds clear to auscultation       Cardiovascular hypertension, Pt. on medications and Pt. on home beta blockers + CAD and +CHF  + dysrhythmias Atrial Fibrillation  Rhythm:Irregular  1. Left ventricular ejection fraction, by estimation, is 60 to 65%. The  left ventricle has normal function. The left ventricle has no regional  wall motion abnormalities. There is moderate left ventricular hypertrophy.  Left ventricular diastolic  parameters are indeterminate.  2. Right ventricular systolic function is moderately reduced. The right  ventricular size is mildly enlarged. There is severely elevated pulmonary  artery systolic pressure. The estimated right ventricular systolic  pressure is 74.0 mmHg.  3. Left atrial size was severely dilated.  4. Right atrial size was severely dilated.  5. The mitral valve is abnormal. Bileaflet prolapse. Moderate to severe  mitral valve regurgitation.  6. Tricuspid valve regurgitation is mild to moderate.  7. The aortic valve is tricuspid. Aortic valve regurgitation is not  visualized. No aortic stenosis is present.  8. Pulmonic valve regurgitation is mild to moderate.  9. The inferior vena cava is dilated in size with <50% respiratory  variability, suggesting right atrial pressure of 15 mmHg.    Neuro/Psych negative neurological ROS  negative psych ROS   GI/Hepatic negative GI ROS, Neg liver ROS,    Endo/Other  diabetes, Type 2  Renal/GU negative Renal ROSLab Results      Component                Value               Date                      CREATININE               1.10                02/25/2021           Lab Results      Component                Value               Date                      K                        4.4                 02/25/2021                Musculoskeletal negative musculoskeletal ROS (+)   Abdominal   Peds  Hematology   Anesthesia Other Findings   Reproductive/Obstetrics                            Anesthesia Physical Anesthesia Plan  ASA: 2  Anesthesia Plan: MAC and  General   Post-op Pain Management:    Induction:   PONV Risk Score and Plan: 2 and Propofol infusion and Treatment may vary due to age or medical condition  Airway Management Planned: Nasal Cannula  Additional Equipment: None  Intra-op Plan:   Post-operative Plan:   Informed Consent: I have reviewed the patients History and Physical, chart, labs and discussed the procedure including the risks, benefits and alternatives for the proposed anesthesia with the patient or authorized representative who has indicated his/her understanding and acceptance.     Dental advisory given  Plan Discussed with: CRNA and Anesthesiologist  Anesthesia Plan Comments:         Anesthesia Quick Evaluation

## 2021-02-26 NOTE — CV Procedure (Signed)
    Transesophageal Echocardiogram Note  EMARION TORAL [id number] July 15, 1941  Procedure: Transesophageal Echocardiogram Indications: atrial fib   Procedure Details Consent: Obtained Time Out: Verified patient identification, verified procedure, site/side was marked, verified correct patient position, special equipment/implants available, Radiology Safety Procedures followed,  medications/allergies/relevent history reviewed, required imaging and test results available.  Performed  Medications:  During this procedure the patient is administered Lidocaine 60 mg followed by a Propofol drip - total of 120 mg for TEE and cardioversion by CRNA Lelon Mast. .  The patient's heart rate, blood pressure, and oxygen saturation are monitored continuously during the procedure. The period of conscious sedation is 30  minutes, of which I was present face-to-face 100% of this time.  Left Ventrical:  normal LV systolic function ,  EF 65-70%  Mitral Valve: prolapse of both the anterior and posterior leaflets moderate - eccentric severe MR ,  no reversal of flow in the PV   Aortic Valve: normal 3 leaflet valve   Tricuspid Valve: moderate TR   Pulmonic Valve: mild PI   Left Atrium/ Left atrial appendage: no thrombi   Atrial septum: no obvious ASD or PFO by color doppler   Aorta: mild - moderate plaque    Complications: No apparent complications Patient did tolerate procedure well.     Cardioversion Note  AMANI NODARSE [id number] 06/19/1941  Procedure: DC Cardioversion Indications: atrial fib   Procedure Details Consent: Obtained Time Out: Verified patient identification, verified procedure, site/side was marked, verified correct patient position, special equipment/implants available, Radiology Safety Procedures followed,  medications/allergies/relevent history reviewed, required imaging and test results available.  Performed  The patient has been on adequate anticoagulation.  The  patient received Licocaine and propofol drip ( see above) for sedation.  Synchronous cardioversion was performed at 200  joules.  The cardioversion was successful.   Complications: No apparent complications Patient did tolerate procedure well.   Vesta Mixer, Montez Hageman., MD, [facility] 02/26/2021, 10:01 AM

## 2021-02-26 NOTE — Plan of Care (Signed)

## 2021-02-26 NOTE — TOC Benefit Eligibility Note (Signed)
Patient Product/process development scientist completed.    The patient is currently admitted and upon discharge could be taking Eliquis 5 mg.  The current 30 day co-pay is, $268.12 due to a $205.95 deductible remaining.   The patient is insured through [employer] Part D     Roland Earl, CPhT Pharmacy Patient Advocate Specialist [facility] Antimicrobial Stewardship Team Direct Number: [phone]  Fax: [phone]

## 2021-02-27 ENCOUNTER — Encounter (HOSPITAL_COMMUNITY): Payer: Self-pay | Admitting: Cardiovascular Disease

## 2021-02-27 ENCOUNTER — Encounter: Payer: Self-pay | Admitting: Thoracic Surgery (Cardiothoracic Vascular Surgery)

## 2021-02-27 NOTE — Progress Notes (Signed)
Micheal [name] DOB Jul 09, 1941  This looks like a clippable valve. The fossa looks approachable for transseptal puncture in the SAXB and the Bicaval view. TR is noted. LA dimensions are large enough for device steering and straddle. MR jet is caused by a bileaflet prolapse. The posterior leaflet is measures 1.45 cm in the 129 LVOT grasping view. MVA and gradient were not provided. I'd like to verify both MVA and gradient in the procedure prior to the start. If they can support it, I would recommend an XTW to start and assess for gradient.  Thank you for uploading!

## 2021-02-28 ENCOUNTER — Other Ambulatory Visit (HOSPITAL_BASED_OUTPATIENT_CLINIC_OR_DEPARTMENT_OTHER): Payer: Self-pay

## 2021-02-28 DIAGNOSIS — E1169 Type 2 diabetes mellitus with other specified complication: Secondary | ICD-10-CM | POA: Diagnosis not present

## 2021-02-28 DIAGNOSIS — I700 Atherosclerosis of aorta: Secondary | ICD-10-CM | POA: Diagnosis not present

## 2021-02-28 DIAGNOSIS — H539 Unspecified visual disturbance: Secondary | ICD-10-CM | POA: Diagnosis not present

## 2021-02-28 DIAGNOSIS — Z7984 Long term (current) use of oral hypoglycemic drugs: Secondary | ICD-10-CM | POA: Diagnosis not present

## 2021-02-28 DIAGNOSIS — I5033 Acute on chronic diastolic (congestive) heart failure: Secondary | ICD-10-CM | POA: Diagnosis not present

## 2021-02-28 DIAGNOSIS — I340 Nonrheumatic mitral (valve) insufficiency: Secondary | ICD-10-CM | POA: Diagnosis not present

## 2021-02-28 DIAGNOSIS — I4891 Unspecified atrial fibrillation: Secondary | ICD-10-CM | POA: Diagnosis not present

## 2021-03-01 ENCOUNTER — Other Ambulatory Visit (HOSPITAL_BASED_OUTPATIENT_CLINIC_OR_DEPARTMENT_OTHER): Payer: Self-pay

## 2021-03-05 ENCOUNTER — Other Ambulatory Visit: Payer: Self-pay

## 2021-03-05 ENCOUNTER — Ambulatory Visit (INDEPENDENT_AMBULATORY_CARE_PROVIDER_SITE_OTHER): Payer: Medicare Other | Admitting: Cardiovascular Disease

## 2021-03-05 ENCOUNTER — Encounter: Payer: Self-pay | Admitting: Cardiovascular Disease

## 2021-03-05 VITALS — BP 108/60 | HR 56 | Ht 67.0 in | Wt 149.4 lb

## 2021-03-05 DIAGNOSIS — I10 Essential (primary) hypertension: Secondary | ICD-10-CM

## 2021-03-05 DIAGNOSIS — I2510 Atherosclerotic heart disease of native coronary artery without angina pectoris: Secondary | ICD-10-CM

## 2021-03-05 NOTE — Progress Notes (Signed)
[facility]   MULTIDISCIPLINARY HEART VALVE TEAM  Date:  03/05/2021   ID:  Frederick F Collums, DOB 08/23/1940, MRN [id number]  PCP:  Varadarajan, Rupashree, MD   CC: evaluation of mitral regurgitation   HISTORY OF PRESENT ILLNESS: Micheal [name] is a 80 y.o. male who presents for evaluation of mitral regurgitation, referred by Dr. [place].  He has a history of CAD s/p CABG x4 (2007), PAD, former heavy tobacco abuse (quit 2007), DMT2, HTN, HLD and recently diagnosed persistent atrial fibrillation on Eliquis.   He has been followed by Dr. Smith and Dr. Arida for CAD and PAD. He presented with class III unstable angina in 2007 and was diagnosed with severe 3V CAD and moderate LV dysfunction (EF 40%) after a markedly abnormal nuclear stress test. He ultimately   underwent CABG x4 with a LIMA--> LAD, SVG--> LCx marg branch, SVG--> post descending, and seq SVG--> right posterolateral branch by Dr Owen. He has had atypical claudication followed by Dr. Arida. He underwent abdominal aortogram with lower extremity angiography in 08/2018 which showed no significant aortoiliac disease, mild R SFA disease with two-vessel runoff below the knee and mild L SFA disease with one-vessel runoff below the knee via the peroneal artery.  It was felt that these findings could not explain his symptoms. Continued medical therapy was recommended.   He was recently admitted 7/31-02/26/21 for new onset atrial fibrillation. He presented with dyspnea and fatigue. He was noted to have a significant murmur on exam and echocardiogram was consistent with moderate to severe mitral regurgitation with severe dilation of both atria. He underwent successful TEE/cardioversion on 02/26/21. TEE showed EF 60-65% with moderate to severe mitral regurgitation with mild holosystolic prolapse of both leaflets of the mitral valve as well as mild to moderate TR. Plans were made for discharge with outpatient structural heart follow up.  The  patient has no history of a heart murmur. The patient has a longstanding limitation because of bilateral leg aching and weakness. This has been unchanged over time. However, last week he developed marked shortness of breath with activity, prompting his hospitalization. He feels much better after undergoing cardioversion and diuresis.  He states that he is 7 pounds lighter than he was at baseline on his home scales.  His breathing is essentially back to baseline now.  He denies chest pain or pressure.  No edema, orthopnea, or PND.   Past Medical History:  Diagnosis Date   CAD (coronary artery disease)    with Cabg 2008   Claudication (HCC)    Diabetes (HCC)    HTN (hypertension)    Hypercholesteremia    Hyperlipidemia     Current Outpatient Medications  Medication Sig Dispense Refill   Acetylcysteine (NAC 600) 600 MG CAPS Take 1 capsule by mouth daily.     apixaban (ELIQUIS) 5 MG TABS tablet Take 1 tablet (5 mg total) by mouth 2 (two) times daily. 60 tablet 2   Cholecalciferol (VITAMIN D) 50 MCG (2000 UT) tablet Take 2,000 Units by mouth in the morning and at bedtime.     CINNAMON PO Take 1,000 mg by mouth daily.     Coenzyme Q10 (CO Q-10) 200 MG CAPS Take 200 mg by mouth daily.      dorzolamide-timolol (COSOPT) 22.3-6.8 MG/ML ophthalmic solution Place 1 drop into both eyes 2 (two) times daily.     Glucosamine-Chondroitin (GLUCOSAMINE CHONDR COMPLEX PO) Take 1 tablet by mouth daily.     levOCARNitine (L-CARNITINE) 500 MG   TABS Take 1 tablet by mouth in the morning.     losartan-hydrochlorothiazide (HYZAAR) 50-12.5 MG tablet TAKE 1 TABLET BY MOUTH EVERY DAY 90 tablet 2   metFORMIN (GLUCOPHAGE) 1000 MG tablet Take 1,000 mg by mouth at bedtime.      metoprolol succinate (TOPROL-XL) 50 MG 24 hr tablet TAKE 1 TABLET BY MOUTH EVERY DAY 90 tablet 1   Misc Natural Products (NF FORMULAS TESTOSTERONE) CAPS Take 1 capsule by mouth in the morning.     Multiple Vitamins-Minerals (PRESERVISION AREDS 2  PO) Take 1 capsule by mouth 2 (two) times daily.     Omega-3 Fatty Acids (FISH OIL PO) Take 1 capsule by mouth daily.     Polyethyl Glycol-Propyl Glycol 0.4-0.3 % SOLN Apply to eye.     pravastatin (PRAVACHOL) 20 MG tablet Take 20 mg by mouth at bedtime.     QUERCETIN PO Take 1 tablet by mouth daily.     vitamin B-12 (CYANOCOBALAMIN) 1000 MCG tablet Take 1,000 mcg by mouth daily.     vitamin E 400 UNIT capsule Take 400 Units by mouth daily.     VYZULTA 0.024 % SOLN Place 1 drop into the right eye at bedtime.     No current facility-administered medications for this visit.    ALLERGIES:   Patient has no known allergies.   SOCIAL HISTORY:  The patient  reports that he has quit smoking. He has never used smokeless tobacco. He reports current alcohol use of about 1.0 standard drink of alcohol per week. He reports that he does not use drugs.   FAMILY HISTORY:  The patient's family history includes Healthy in his sister, sister, and sister; Heart attack in his maternal grandfather; Heart disease in his father.   REVIEW OF SYSTEMS:  Positive for fatigue.   All other systems are reviewed and negative.   PHYSICAL EXAM: VS:  BP 108/60   Pulse (!) 56   Ht 5\' 7"  (1.702 m)   Wt 149 lb 6.4 oz (67.8 kg)   SpO2 96%   BMI 23.40 kg/m  , BMI Body mass index is 23.4 kg/m. GEN: Well nourished, well developed, in no acute distress HEENT: normal Neck: No JVD. carotids 2+ without bruits or masses Cardiac: The heart is irregularly irregular with grade 3/6 holosystolic murmur at the apex. No edema. Pedal pulses 2+ = bilaterally  Respiratory:  clear to auscultation bilaterally GI: soft, nontender, nondistended, + BS MS: no deformity or atrophy Skin: warm and dry, no rash Neuro:  Strength and sensation are intact Psych: euthymic mood, full affect  EKG:  EKG from today reviewed and demonstrates atrial fibrillation with slow ventricular response 56 bpm, nonspecific ST abnormality  RECENT  LABS: 02/24/2021: B Natriuretic Peptide 1,394.6 02/25/2021: BUN 22; Creatinine, Ser 1.10; Potassium 4.4; Sodium 137; TSH 1.897 02/26/2021: Hemoglobin 12.3; Platelets 204  02/25/2021: Cholesterol 130; HDL 35; LDL Cholesterol 75; Total CHOL/HDL Ratio 3.7; Triglycerides 102; VLDL 20   Estimated Creatinine Clearance: 49.2 mL/min (by C-G formula based on SCr of 1.1 mg/dL).   Wt Readings from Last 3 Encounters:  03/05/21 149 lb 6.4 oz (67.8 kg)  02/26/21 146 lb 6.4 oz (66.4 kg)  11/19/20 159 lb (72.1 kg)     CARDIAC STUDIES: Echo: 02/25/21 IMPRESSIONS   1. Left ventricular ejection fraction, by estimation, is 60 to 65%. The  left ventricle has normal function. The left ventricle has no regional  wall motion abnormalities. There is moderate left ventricular hypertrophy.  Left ventricular diastolic  parameters are indeterminate.   2. Right ventricular systolic function is moderately reduced. The right  ventricular size is mildly enlarged. There is severely elevated pulmonary  artery systolic pressure. The estimated right ventricular systolic  pressure is 68.0 mmHg.   3. Left atrial size was severely dilated.   4. Right atrial size was severely dilated.   5. The mitral valve is abnormal. Bileaflet prolapse. Moderate to severe  mitral valve regurgitation.   6. Tricuspid valve regurgitation is mild to moderate.   7. The aortic valve is tricuspid. Aortic valve regurgitation is not  visualized. No aortic stenosis is present.   8. Pulmonic valve regurgitation is mild to moderate.   9. The inferior vena cava is dilated in size with <50% respiratory  variability, suggesting right atrial pressure of 15 mmHg.    ______________________   TEE 02/26/21 IMPRESSIONS   1. Left ventricular ejection fraction, by estimation, is 60 to 65%. The  left ventricle has normal function.   2. Right ventricular systolic function is normal. The right ventricular  size is normal.   3. No left atrial/left atrial  appendage thrombus was detected.   4. There was no reversal of flow in the pulmonary arteries.      . The mitral valve is grossly normal. Moderate to severe mitral valve  regurgitation. There is mild holosystolic prolapse of both leaflets of the  mitral valve.   5. Tricuspid valve regurgitation is mild to moderate.   6. The aortic valve is tricuspid. Aortic valve regurgitation is not  visualized.   FINDINGS   Left Ventricle: Left ventricular ejection fraction, by estimation, is 60  to 65%. The left ventricle has normal function. The left ventricular  internal cavity size was normal in size.  Right Ventricle: The right ventricular size is normal. Right vetricular  wall thickness was not well visualized. Right ventricular systolic  function is normal.  Left Atrium: Left atrial size was normal in size. No left atrial/left  atrial appendage thrombus was detected.  Right Atrium: Right atrial size was normal in size.  Pericardium: There is no evidence of pericardial effusion.  Mitral Valve: There was no reversal of flow in the pulmonary arteries.  The mitral valve is grossly normal. There is mild holosystolic prolapse of  both leaflets of the mitral valve. Moderate to severe mitral valve  regurgitation.  Tricuspid Valve: The tricuspid valve is grossly normal. Tricuspid valve  regurgitation is mild to moderate.  Aortic Valve: The aortic valve is tricuspid. Aortic valve regurgitation is  not visualized.  Pulmonic Valve: The pulmonic valve was grossly normal. Pulmonic valve  regurgitation is not visualized.  Aorta: The aortic root and ascending aorta are structurally normal, with  no evidence of dilitation.  IAS/Shunts: The atrial septum is grossly normal.  MR PISA:        1.01 cm  MR PISA Radius: 0.40 cm   ___________________________   STS RISK CALCULATOR: Procedure: Isolated MVR Risk of Mortality: 6.057% Renal Failure: 3.474% Permanent Stroke: 2.002% Prolonged  Ventilation: 16.863% DSW Infection: 0.157% Reoperation: 6.660% Morbidity or Mortality: 23.156% Short Length of Stay: 16.210% Long Length of Stay: 13.032%   Procedure: MV Repair Risk of Mortality: 5.460% Renal Failure: 3.011% Permanent Stroke: 3.897% Prolonged Ventilation: 13.839% DSW Infection: 0.082% Reoperation: 6.140% Morbidity or Mortality: 18.537% Short Length of Stay: 23.554% Long Length of Stay: 10.659%   ASSESSMENT AND PLAN: 80 year old gentleman with recent hospitalization for atrial fibrillation and congestive heart failure, found to have bileaflet mitral valve prolapse with  moderately severe mitral regurgitation.  I reviewed the patient's transesophageal echo which demonstrated normal LV systolic function, normal RV function, no evidence of pulmonary hypertension, and moderately severe mitral regurgitation.  Pulmonary vein flow is blunted but not reversed.  There is no 3D imaging of the mitral valve performed.  Based on color-flow assessment, the degree of mitral regurgitation appears to be at least 3+. I have reviewed the natural history of mitral regurgitation with the patient and their family members who are present today. We have discussed the limitations of medical therapy and the poor prognosis associated with symptomatic mitral regurgitation. We have also reviewed potential treatment options, including palliative medical therapy, conventional surgical mitral valve repair or replacement, and percutaneous mitral valve therapies such as edge-to-edge mitral valve approximation with MitraClip. We discussed treatment options in the context of this patient's specific comorbid medical conditions.  I recommended proceeding with a right and left heart catheterization to assess graft patency, but more importantly assess hemodynamics.  If the patient remains well compensated, it might be reasonable to treat him medically with a strategy of ongoing rate control and  anticoagulation.  He is back in atrial fibrillation after recent cardioversion and I think his best antiarrhythmic drug options would be either amiodarone or dofetilide.  However, he has severe biatrial enlargement and I think it is unlikely that he will be able to maintain sinus rhythm.  The mechanism of his mitral regurgitation appears to be leaflet redundancy with bileaflet prolapse (Barlow's valve).  While this anatomy is treatable with transcatheter edge-to-edge repair, I think there is a higher risk of recurrent mitral regurgitation due to further valve deterioration following MitraClip.  All of these issues will be factored in to the decision of whether to proceed with transcatheter edge-to-edge mitral valve repair or continue with ongoing medical therapy.  The patient's case is discussed with his primary cardiologist, Dr. Katrinka Blazing.  Right and left heart catheterization will be scheduled once he is 4 weeks out from his cardioversion so that his anticoagulation is not interrupted prior to that. I have reviewed the risks, indications, and alternatives to cardiac catheterization, possible angioplasty, and stenting with the patient. Risks include but are not limited to bleeding, infection, vascular injury, stroke, myocardial infection, arrhythmia, kidney injury, radiation-related injury in the case of prolonged fluoroscopy use, emergency cardiac surgery, and death. The patient understands the risks of serious complication is 1-2 in 1000 with diagnostic cardiac cath and 1-2% or less with angioplasty/stenting.    Enzo Bi 03/05/2021 2:36 PM     [facility] [address] [place] [address]  [phone] (office) [phone] (fax)

## 2021-03-05 NOTE — Patient Instructions (Signed)
Medication Instructions:  Your physician recommends that you continue on your current medications as directed. Please refer to the Current Medication list given to you today.  *If you need a refill on your cardiac medications before your next appointment, please call your pharmacy*  Testing/Procedures: Your physician has requested that you have a cardiac catheterization. Cardiac catheterization is used to diagnose and/or treat various heart conditions. Doctors may recommend this procedure for a number of different reasons. The most common reason is to evaluate chest pain. Chest pain can be a symptom of coronary artery disease (CAD), and cardiac catheterization can show whether plaque is narrowing or blocking your heart's arteries. This procedure is also used to evaluate the valves, as well as measure the blood flow and oxygen levels in different parts of your heart. For further information please visit [email or web address] Please follow instruction sheet, as given.  Follow-Up: At [facility], you and your health needs are our priority.  As part of our continuing mission to provide you with exceptional heart care, we have created designated Provider Care Teams.  These Care Teams include your primary Cardiologist (physician) and Advanced Practice Providers (APPs -  Physician Assistants and Nurse Practitioners) who all work together to provide you with the care you need, when you need it.  Other Instructions  [facility] CARDIOVASCULAR DIVISION [facility] OFFICE [address] [place] [address] Dept: [phone] Loc: [phone]  Micheal [name]  03/05/2021  You are scheduled for a Cardiac Catheterization on Wednesday, September 7 with Dr. Verdis Prime.  1. Please arrive at the [facility] (Main Entrance A) at [facility]: [address] [place], [place] [address] at 6:30 AM (This time is two hours before your procedure to ensure  your preparation). Free valet parking service is available.   Special note: Every effort is made to have your procedure done on time. Please understand that emergencies sometimes delay scheduled procedures.  2. Diet: Do not eat solid foods after midnight.  The patient may have clear liquids until 5am upon the day of the procedure.  3. Labs: TODAY  4. Medication instructions in preparation for your procedure:   Contrast Allergy: No  Stop taking Eliquis (Apixiban) on Monday, September 5.  Hold Losartan-HCTZ on the morning of your procedure  Do not take Diabetes Med Glucophage (Metformin) on the day of the procedure and HOLD 48 HOURS AFTER THE PROCEDURE.  On the morning of your procedure, take your Aspirin and any morning medicines NOT listed above.  You may use sips of water.  5. Plan for one night stay--bring personal belongings. 6. Bring a current list of your medications and current insurance cards. 7. You MUST have a responsible person to drive you home. 8. Someone MUST be with you the first 24 hours after you arrive home or your discharge will be delayed. 9. Please wear clothes that are easy to get on and off and wear slip-on shoes.  Thank you for allowing [place] to care for you!   -- [facility]

## 2021-03-05 NOTE — H&P (View-Only) (Signed)
[facility]   MULTIDISCIPLINARY HEART VALVE TEAM  Date:  03/05/2021   ID:  Micheal [name], DOB 04/14/1941, MRN [id number]  PCP:  Lorenda IshiharaVaradarajan, Rupashree, MD   CC: evaluation of mitral regurgitation   HISTORY OF PRESENT ILLNESS: Micheal [name] is a 80 y.o. male who presents for evaluation of mitral regurgitation, referred by Dr. Duke Salviaandolph.  He has a history of CAD s/p CABG x4 (2007), PAD, former heavy tobacco abuse (quit 2007), DMT2, HTN, HLD and recently diagnosed persistent atrial fibrillation on Eliquis.   He has been followed by Dr. [name] and Dr. Kirke CorinArida for CAD and PAD. He presented with class III unstable angina in 2007 and was diagnosed with severe 3V CAD and moderate LV dysfunction (EF 40%) after a markedly abnormal nuclear stress test. He ultimately   underwent CABG x4 with a LIMA--> LAD, SVG--> LCx marg branch, SVG--> post descending, and seq SVG--> right posterolateral branch by Dr Cornelius Moraswen. He has had atypical claudication followed by Dr. Kirke CorinArida. He underwent abdominal aortogram with lower extremity angiography in 08/2018 which showed no significant aortoiliac disease, mild R SFA disease with two-vessel runoff below the knee and mild L SFA disease with one-vessel runoff below the knee via the peroneal artery.  It was felt that these findings could not explain his symptoms. Continued medical therapy was recommended.   He was recently admitted 7/31-02/26/21 for new onset atrial fibrillation. He presented with dyspnea and fatigue. He was noted to have a significant murmur on exam and echocardiogram was consistent with moderate to severe mitral regurgitation with severe dilation of both atria. He underwent successful TEE/cardioversion on 02/26/21. TEE showed EF 60-65% with moderate to severe mitral regurgitation with mild holosystolic prolapse of both leaflets of the mitral valve as well as mild to moderate TR. Plans were made for discharge with outpatient structural heart follow up.  The  patient has no history of a heart murmur. The patient has a longstanding limitation because of bilateral leg aching and weakness. This has been unchanged over time. However, last week he developed marked shortness of breath with activity, prompting his hospitalization. He feels much better after undergoing cardioversion and diuresis.  He states that he is 7 pounds lighter than he was at baseline on his home scales.  His breathing is essentially back to baseline now.  He denies chest pain or pressure.  No edema, orthopnea, or PND.   Past Medical History:  Diagnosis Date   CAD (coronary artery disease)    with Cabg 2008   Claudication (HCC)    Diabetes (HCC)    HTN (hypertension)    Hypercholesteremia    Hyperlipidemia     Current Outpatient Medications  Medication Sig Dispense Refill   Acetylcysteine (NAC 600) 600 MG CAPS Take 1 capsule by mouth daily.     apixaban (ELIQUIS) 5 MG TABS tablet Take 1 tablet (5 mg total) by mouth 2 (two) times daily. 60 tablet 2   Cholecalciferol (VITAMIN D) 50 MCG (2000 UT) tablet Take 2,000 Units by mouth in the morning and at bedtime.     CINNAMON PO Take 1,000 mg by mouth daily.     Coenzyme Q10 (CO Q-10) 200 MG CAPS Take 200 mg by mouth daily.      dorzolamide-timolol (COSOPT) 22.3-6.8 MG/ML ophthalmic solution Place 1 drop into both eyes 2 (two) times daily.     Glucosamine-Chondroitin (GLUCOSAMINE CHONDR COMPLEX PO) Take 1 tablet by mouth daily.     levOCARNitine (L-CARNITINE) 500 MG  TABS Take 1 tablet by mouth in the morning.     losartan-hydrochlorothiazide (HYZAAR) 50-12.5 MG tablet TAKE 1 TABLET BY MOUTH EVERY DAY 90 tablet 2   metFORMIN (GLUCOPHAGE) 1000 MG tablet Take 1,000 mg by mouth at bedtime.      metoprolol succinate (TOPROL-XL) 50 MG 24 hr tablet TAKE 1 TABLET BY MOUTH EVERY DAY 90 tablet 1   Misc Natural Products (NF FORMULAS TESTOSTERONE) CAPS Take 1 capsule by mouth in the morning.     Multiple Vitamins-Minerals (PRESERVISION AREDS 2  PO) Take 1 capsule by mouth 2 (two) times daily.     Omega-3 Fatty Acids (FISH OIL PO) Take 1 capsule by mouth daily.     Polyethyl Glycol-Propyl Glycol 0.4-0.3 % SOLN Apply to eye.     pravastatin (PRAVACHOL) 20 MG tablet Take 20 mg by mouth at bedtime.     QUERCETIN PO Take 1 tablet by mouth daily.     vitamin B-12 (CYANOCOBALAMIN) 1000 MCG tablet Take 1,000 mcg by mouth daily.     vitamin E 400 UNIT capsule Take 400 Units by mouth daily.     VYZULTA 0.024 % SOLN Place 1 drop into the right eye at bedtime.     No current facility-administered medications for this visit.    ALLERGIES:   Patient has no known allergies.   SOCIAL HISTORY:  The patient  reports that he has quit smoking. He has never used smokeless tobacco. He reports current alcohol use of about 1.0 standard drink of alcohol per week. He reports that he does not use drugs.   FAMILY HISTORY:  The patient's family history includes Healthy in his sister, sister, and sister; Heart attack in his maternal grandfather; Heart disease in his father.   REVIEW OF SYSTEMS:  Positive for fatigue.   All other systems are reviewed and negative.   PHYSICAL EXAM: VS:  BP 108/60   Pulse (!) 56   Ht 5\' 7"  (1.702 m)   Wt 149 lb 6.4 oz (67.8 kg)   SpO2 96%   BMI 23.40 kg/m  , BMI Body mass index is 23.4 kg/m. GEN: Well nourished, well developed, in no acute distress HEENT: normal Neck: No JVD. carotids 2+ without bruits or masses Cardiac: The heart is irregularly irregular with grade 3/6 holosystolic murmur at the apex. No edema. Pedal pulses 2+ = bilaterally  Respiratory:  clear to auscultation bilaterally GI: soft, nontender, nondistended, + BS MS: no deformity or atrophy Skin: warm and dry, no rash Neuro:  Strength and sensation are intact Psych: euthymic mood, full affect  EKG:  EKG from today reviewed and demonstrates atrial fibrillation with slow ventricular response 56 bpm, nonspecific ST abnormality  RECENT  LABS: 02/24/2021: B Natriuretic Peptide 1,394.6 02/25/2021: BUN 22; Creatinine, Ser 1.10; Potassium 4.4; Sodium 137; TSH 1.897 02/26/2021: Hemoglobin 12.3; Platelets 204  02/25/2021: Cholesterol 130; HDL 35; LDL Cholesterol 75; Total CHOL/HDL Ratio 3.7; Triglycerides 102; VLDL 20   Estimated Creatinine Clearance: 49.2 mL/min (by C-G formula based on SCr of 1.1 mg/dL).   Wt Readings from Last 3 Encounters:  03/05/21 149 lb 6.4 oz (67.8 kg)  02/26/21 146 lb 6.4 oz (66.4 kg)  11/19/20 159 lb (72.1 kg)     CARDIAC STUDIES: Echo: 02/25/21 IMPRESSIONS   1. Left ventricular ejection fraction, by estimation, is 60 to 65%. The  left ventricle has normal function. The left ventricle has no regional  wall motion abnormalities. There is moderate left ventricular hypertrophy.  Left ventricular diastolic  parameters are indeterminate.   2. Right ventricular systolic function is moderately reduced. The right  ventricular size is mildly enlarged. There is severely elevated pulmonary  artery systolic pressure. The estimated right ventricular systolic  pressure is 68.0 mmHg.   3. Left atrial size was severely dilated.   4. Right atrial size was severely dilated.   5. The mitral valve is abnormal. Bileaflet prolapse. Moderate to severe  mitral valve regurgitation.   6. Tricuspid valve regurgitation is mild to moderate.   7. The aortic valve is tricuspid. Aortic valve regurgitation is not  visualized. No aortic stenosis is present.   8. Pulmonic valve regurgitation is mild to moderate.   9. The inferior vena cava is dilated in size with <50% respiratory  variability, suggesting right atrial pressure of 15 mmHg.    ______________________   TEE 02/26/21 IMPRESSIONS   1. Left ventricular ejection fraction, by estimation, is 60 to 65%. The  left ventricle has normal function.   2. Right ventricular systolic function is normal. The right ventricular  size is normal.   3. No left atrial/left atrial  appendage thrombus was detected.   4. There was no reversal of flow in the pulmonary arteries.      . The mitral valve is grossly normal. Moderate to severe mitral valve  regurgitation. There is mild holosystolic prolapse of both leaflets of the  mitral valve.   5. Tricuspid valve regurgitation is mild to moderate.   6. The aortic valve is tricuspid. Aortic valve regurgitation is not  visualized.   FINDINGS   Left Ventricle: Left ventricular ejection fraction, by estimation, is 60  to 65%. The left ventricle has normal function. The left ventricular  internal cavity size was normal in size.  Right Ventricle: The right ventricular size is normal. Right vetricular  wall thickness was not well visualized. Right ventricular systolic  function is normal.  Left Atrium: Left atrial size was normal in size. No left atrial/left  atrial appendage thrombus was detected.  Right Atrium: Right atrial size was normal in size.  Pericardium: There is no evidence of pericardial effusion.  Mitral Valve: There was no reversal of flow in the pulmonary arteries.  The mitral valve is grossly normal. There is mild holosystolic prolapse of  both leaflets of the mitral valve. Moderate to severe mitral valve  regurgitation.  Tricuspid Valve: The tricuspid valve is grossly normal. Tricuspid valve  regurgitation is mild to moderate.  Aortic Valve: The aortic valve is tricuspid. Aortic valve regurgitation is  not visualized.  Pulmonic Valve: The pulmonic valve was grossly normal. Pulmonic valve  regurgitation is not visualized.  Aorta: The aortic root and ascending aorta are structurally normal, with  no evidence of dilitation.  IAS/Shunts: The atrial septum is grossly normal.  MR PISA:        1.01 cm  MR PISA Radius: 0.40 cm   ___________________________   STS RISK CALCULATOR: Procedure: Isolated MVR Risk of Mortality: 6.057% Renal Failure: 3.474% Permanent Stroke: 2.002% Prolonged  Ventilation: 16.863% DSW Infection: 0.157% Reoperation: 6.660% Morbidity or Mortality: 23.156% Short Length of Stay: 16.210% Long Length of Stay: 13.032%   Procedure: MV Repair Risk of Mortality: 5.460% Renal Failure: 3.011% Permanent Stroke: 3.897% Prolonged Ventilation: 13.839% DSW Infection: 0.082% Reoperation: 6.140% Morbidity or Mortality: 18.537% Short Length of Stay: 23.554% Long Length of Stay: 10.659%   ASSESSMENT AND PLAN: 80 year old gentleman with recent hospitalization for atrial fibrillation and congestive heart failure, found to have bileaflet mitral valve prolapse with  moderately severe mitral regurgitation.  I reviewed the patient's transesophageal echo which demonstrated normal LV systolic function, normal RV function, no evidence of pulmonary hypertension, and moderately severe mitral regurgitation.  Pulmonary vein flow is blunted but not reversed.  There is no 3D imaging of the mitral valve performed.  Based on color-flow assessment, the degree of mitral regurgitation appears to be at least 3+. I have reviewed the natural history of mitral regurgitation with the patient and their family members who are present today. We have discussed the limitations of medical therapy and the poor prognosis associated with symptomatic mitral regurgitation. We have also reviewed potential treatment options, including palliative medical therapy, conventional surgical mitral valve repair or replacement, and percutaneous mitral valve therapies such as edge-to-edge mitral valve approximation with MitraClip. We discussed treatment options in the context of this patient's specific comorbid medical conditions.  I recommended proceeding with a right and left heart catheterization to assess graft patency, but more importantly assess hemodynamics.  If the patient remains well compensated, it might be reasonable to treat him medically with a strategy of ongoing rate control and  anticoagulation.  He is back in atrial fibrillation after recent cardioversion and I think his best antiarrhythmic drug options would be either amiodarone or dofetilide.  However, he has severe biatrial enlargement and I think it is unlikely that he will be able to maintain sinus rhythm.  The mechanism of his mitral regurgitation appears to be leaflet redundancy with bileaflet prolapse (Barlow's valve).  While this anatomy is treatable with transcatheter edge-to-edge repair, I think there is a higher risk of recurrent mitral regurgitation due to further valve deterioration following MitraClip.  All of these issues will be factored in to the decision of whether to proceed with transcatheter edge-to-edge mitral valve repair or continue with ongoing medical therapy.  The patient's case is discussed with his primary cardiologist, Dr. Katrinka Blazing.  Right and left heart catheterization will be scheduled once he is 4 weeks out from his cardioversion so that his anticoagulation is not interrupted prior to that. I have reviewed the risks, indications, and alternatives to cardiac catheterization, possible angioplasty, and stenting with the patient. Risks include but are not limited to bleeding, infection, vascular injury, stroke, myocardial infection, arrhythmia, kidney injury, radiation-related injury in the case of prolonged fluoroscopy use, emergency cardiac surgery, and death. The patient understands the risks of serious complication is 1-2 in 1000 with diagnostic cardiac cath and 1-2% or less with angioplasty/stenting.    Enzo Bi 03/05/2021 2:36 PM     [facility] [address] [place] [address]  [phone] (office) [phone] (fax)

## 2021-03-06 ENCOUNTER — Encounter (INDEPENDENT_AMBULATORY_CARE_PROVIDER_SITE_OTHER): Payer: Medicare Other | Admitting: Ophthalmology

## 2021-03-06 DIAGNOSIS — I10 Essential (primary) hypertension: Secondary | ICD-10-CM | POA: Diagnosis not present

## 2021-03-06 DIAGNOSIS — H43813 Vitreous degeneration, bilateral: Secondary | ICD-10-CM

## 2021-03-06 DIAGNOSIS — H353231 Exudative age-related macular degeneration, bilateral, with active choroidal neovascularization: Secondary | ICD-10-CM

## 2021-03-06 DIAGNOSIS — H35033 Hypertensive retinopathy, bilateral: Secondary | ICD-10-CM | POA: Diagnosis not present

## 2021-03-06 LAB — BASIC METABOLIC PANEL
BUN/Creatinine Ratio: 16 (ref 10–24)
BUN: 19 mg/dL (ref 8–27)
CO2: 28 mmol/L (ref 20–29)
Calcium: 9.1 mg/dL (ref 8.6–10.2)
Chloride: 101 mmol/L (ref 96–106)
Creatinine, Ser: 1.19 mg/dL (ref 0.76–1.27)
Glucose: 108 mg/dL — ABNORMAL HIGH (ref 65–99)
Potassium: 4.7 mmol/L (ref 3.5–5.2)
Sodium: 141 mmol/L (ref 134–144)
eGFR: 61 mL/min/{1.73_m2} (ref >59)

## 2021-03-06 LAB — CBC
Hematocrit: 38 % (ref 37.5–51.0)
Hemoglobin: 12.6 g/dL — ABNORMAL LOW (ref 13.0–17.7)
MCH: 32.6 pg (ref 26.6–33.0)
MCHC: 33.2 g/dL (ref 31.5–35.7)
MCV: 98 fL — ABNORMAL HIGH (ref 79–97)
Platelets: 175 10*3/uL (ref 150–450)
RBC: 3.87 x10E6/uL — ABNORMAL LOW (ref 4.14–5.80)
RDW: 13.9 % (ref 11.6–15.4)
WBC: 5.9 10*3/uL (ref 3.4–10.8)

## 2021-03-07 ENCOUNTER — Ambulatory Visit (HOSPITAL_COMMUNITY): Payer: Medicare Other | Admitting: Physician Assistant

## 2021-03-07 ENCOUNTER — Encounter (INDEPENDENT_AMBULATORY_CARE_PROVIDER_SITE_OTHER): Payer: Medicare Other | Admitting: Ophthalmology

## 2021-03-07 ENCOUNTER — Other Ambulatory Visit: Payer: Self-pay

## 2021-03-07 DIAGNOSIS — H353211 Exudative age-related macular degeneration, right eye, with active choroidal neovascularization: Secondary | ICD-10-CM

## 2021-03-27 ENCOUNTER — Encounter (INDEPENDENT_AMBULATORY_CARE_PROVIDER_SITE_OTHER): Payer: Medicare Other | Admitting: Ophthalmology

## 2021-03-27 ENCOUNTER — Other Ambulatory Visit: Payer: Self-pay

## 2021-03-27 DIAGNOSIS — H26492 Other secondary cataract, left eye: Secondary | ICD-10-CM

## 2021-03-29 ENCOUNTER — Ambulatory Visit: Payer: Medicare Other | Admitting: Interventional Cardiology

## 2021-04-01 NOTE — H&P (Signed)
Needs right and left heart cath to assess hemodynamic significance of MR and evaluate for BG failure

## 2021-04-02 ENCOUNTER — Telehealth: Payer: Self-pay | Admitting: *Deleted

## 2021-04-02 DIAGNOSIS — Z9842 Cataract extraction status, left eye: Secondary | ICD-10-CM | POA: Diagnosis not present

## 2021-04-02 NOTE — Telephone Encounter (Signed)
Cardiac catheterization scheduled at [facility] for: Wednesday April 03, 2021 8:30 AM [facility] Main Entrance [facility]) at: 6:30 AM   No solid food after midnight prior to cath, clear liquids until 5 AM day of procedure.  Medication instructions: Hold: -Eliquis-none 04/01/21 until post procedure -Metformin- day of procedure and 48 hours post procedure -Losartan-HCT-AM of procedure  Except hold medications morning medications can be taken pre-cath with sips of water including aspirin 81 mg.    Confirmed patient has responsible adult to drive home post procedure and be with patient first 24 hours after arriving home.  Patients are allowed one visitor in the waiting room during the time they are at the hospital for their procedure. Both patient and visitor must wear a mask once they enter the hospital.   Patient reports does not currently have any symptoms concerning for COVID-19 and no household members with COVID-19 like illness.                Reviewed procedure/mask/visitor instructions with patient.

## 2021-04-03 ENCOUNTER — Other Ambulatory Visit: Payer: Self-pay

## 2021-04-03 ENCOUNTER — Encounter (HOSPITAL_COMMUNITY): Payer: Self-pay | Admitting: Interventional Cardiology

## 2021-04-03 ENCOUNTER — Encounter (HOSPITAL_COMMUNITY)
Admission: RE | Disposition: A | Discharge status: Home / Self Care | Payer: Self-pay | Source: Home / Self Care | Attending: Interventional Cardiology

## 2021-04-03 ENCOUNTER — Ambulatory Visit (HOSPITAL_COMMUNITY)
Admission: RE | Admit: 2021-04-03 | Discharge: 2021-04-03 | Disposition: A | Discharge status: Home / Self Care | Payer: Medicare Other | Attending: Interventional Cardiology | Admitting: Interventional Cardiology

## 2021-04-03 DIAGNOSIS — I10 Essential (primary) hypertension: Secondary | ICD-10-CM | POA: Diagnosis present

## 2021-04-03 DIAGNOSIS — Z8249 Family history of ischemic heart disease and other diseases of the circulatory system: Secondary | ICD-10-CM | POA: Insufficient documentation

## 2021-04-03 DIAGNOSIS — Z7984 Long term (current) use of oral hypoglycemic drugs: Secondary | ICD-10-CM | POA: Insufficient documentation

## 2021-04-03 DIAGNOSIS — E119 Type 2 diabetes mellitus without complications: Secondary | ICD-10-CM

## 2021-04-03 DIAGNOSIS — I272 Pulmonary hypertension, unspecified: Secondary | ICD-10-CM | POA: Diagnosis not present

## 2021-04-03 DIAGNOSIS — I4891 Unspecified atrial fibrillation: Secondary | ICD-10-CM | POA: Diagnosis present

## 2021-04-03 DIAGNOSIS — I509 Heart failure, unspecified: Secondary | ICD-10-CM | POA: Diagnosis not present

## 2021-04-03 DIAGNOSIS — Z79899 Other long term (current) drug therapy: Secondary | ICD-10-CM | POA: Diagnosis not present

## 2021-04-03 DIAGNOSIS — I110 Hypertensive heart disease with heart failure: Secondary | ICD-10-CM | POA: Diagnosis not present

## 2021-04-03 DIAGNOSIS — Z87891 Personal history of nicotine dependence: Secondary | ICD-10-CM | POA: Diagnosis not present

## 2021-04-03 DIAGNOSIS — I25810 Atherosclerosis of coronary artery bypass graft(s) without angina pectoris: Secondary | ICD-10-CM | POA: Diagnosis present

## 2021-04-03 DIAGNOSIS — I081 Rheumatic disorders of both mitral and tricuspid valves: Secondary | ICD-10-CM | POA: Insufficient documentation

## 2021-04-03 DIAGNOSIS — Z7901 Long term (current) use of anticoagulants: Secondary | ICD-10-CM | POA: Diagnosis not present

## 2021-04-03 DIAGNOSIS — I2510 Atherosclerotic heart disease of native coronary artery without angina pectoris: Secondary | ICD-10-CM

## 2021-04-03 DIAGNOSIS — I2582 Chronic total occlusion of coronary artery: Secondary | ICD-10-CM | POA: Insufficient documentation

## 2021-04-03 DIAGNOSIS — R079 Chest pain, unspecified: Secondary | ICD-10-CM | POA: Diagnosis present

## 2021-04-03 DIAGNOSIS — I340 Nonrheumatic mitral (valve) insufficiency: Secondary | ICD-10-CM

## 2021-04-03 DIAGNOSIS — E785 Hyperlipidemia, unspecified: Secondary | ICD-10-CM | POA: Diagnosis present

## 2021-04-03 HISTORY — PX: CARDIAC CATHETERIZATION: SHX172

## 2021-04-03 HISTORY — PX: RIGHT/LEFT HEART CATH AND CORONARY/GRAFT ANGIOGRAPHY: CATH118267

## 2021-04-03 LAB — POCT I-STAT EG7
Acid-Base Excess: 1 mmol/L (ref 0.0–2.0)
Acid-Base Excess: 0 mmol/L (ref 0.0–2.0)
Bicarbonate: 27.5 mmol/L (ref 20.0–28.0)
Bicarbonate: 27.4 mmol/L (ref 20.0–28.0)
Calcium, Ion: 1.23 mmol/L (ref 1.15–1.40)
Calcium, Ion: 1.25 mmol/L (ref 1.15–1.40)
HCT: 37 % — ABNORMAL LOW (ref 39.0–52.0)
HCT: 37 % — ABNORMAL LOW (ref 39.0–52.0)
Hemoglobin: 12.6 g/dL — ABNORMAL LOW (ref 13.0–17.0)
Hemoglobin: 12.6 g/dL — ABNORMAL LOW (ref 13.0–17.0)
O2 Saturation: 69 %
O2 Saturation: 70 %
Potassium: 3.9 mmol/L (ref 3.5–5.1)
Potassium: 3.9 mmol/L (ref 3.5–5.1)
Sodium: 142 mmol/L (ref 135–145)
Sodium: 141 mmol/L (ref 135–145)
TCO2: 29 mmol/L (ref 22–32)
TCO2: 29 mmol/L (ref 22–32)
pCO2, Ven: 52.2 mmHg (ref 44.0–60.0)
pCO2, Ven: 52.9 mmHg (ref 44.0–60.0)
pH, Ven: 7.329 (ref 7.250–7.430)
pH, Ven: 7.322 (ref 7.250–7.430)
pO2, Ven: 39 mmHg (ref 32.0–45.0)
pO2, Ven: 40 mmHg (ref 32.0–45.0)

## 2021-04-03 LAB — POCT I-STAT 7, (LYTES, BLD GAS, ICA,H+H)
Acid-Base Excess: 0 mmol/L (ref 0.0–2.0)
Bicarbonate: 25.6 mmol/L (ref 20.0–28.0)
Calcium, Ion: 1.25 mmol/L (ref 1.15–1.40)
HCT: 36 % — ABNORMAL LOW (ref 39.0–52.0)
Hemoglobin: 12.2 g/dL — ABNORMAL LOW (ref 13.0–17.0)
O2 Saturation: 98 %
Potassium: 4 mmol/L (ref 3.5–5.1)
Sodium: 141 mmol/L (ref 135–145)
TCO2: 27 mmol/L (ref 22–32)
pCO2 arterial: 45.3 mmHg (ref 32.0–48.0)
pH, Arterial: 7.361 (ref 7.350–7.450)
pO2, Arterial: 102 mmHg (ref 83.0–108.0)

## 2021-04-03 LAB — POCT I-STAT, CHEM 8
BUN: 15 mg/dL (ref 8–23)
Calcium, Ion: 1.22 mmol/L (ref 1.15–1.40)
Chloride: 104 mmol/L (ref 98–111)
Creatinine, Ser: 0.8 mg/dL (ref 0.61–1.24)
Glucose, Bld: 150 mg/dL — ABNORMAL HIGH (ref 70–99)
HCT: 37 % — ABNORMAL LOW (ref 39.0–52.0)
Hemoglobin: 12.6 g/dL — ABNORMAL LOW (ref 13.0–17.0)
Potassium: 3.9 mmol/L (ref 3.5–5.1)
Sodium: 141 mmol/L (ref 135–145)
TCO2: 26 mmol/L (ref 22–32)

## 2021-04-03 LAB — GLUCOSE, CAPILLARY: Glucose-Capillary: 146 mg/dL — ABNORMAL HIGH (ref 70–99)

## 2021-04-03 SURGERY — RIGHT/LEFT HEART CATH AND CORONARY/GRAFT ANGIOGRAPHY
Anesthesia: LOCAL

## 2021-04-03 MED ORDER — APIXABAN 5 MG PO TABS: 5.0000 mg | ORAL_TABLET | Freq: Two times a day (BID) | ORAL | 2 refills | Status: DC

## 2021-04-03 MED ORDER — VERAPAMIL HCL 2.5 MG/ML IV SOLN
INTRAVENOUS | Status: AC
  Filled 2021-04-03: qty 2

## 2021-04-03 MED ORDER — FENTANYL CITRATE (PF) 100 MCG/2ML IJ SOLN
INTRAMUSCULAR | Status: DC | PRN
  Administered 2021-04-03 (×2): 25 ug via INTRAVENOUS

## 2021-04-03 MED ORDER — IOHEXOL 350 MG/ML SOLN
INTRAVENOUS | Status: DC | PRN
  Administered 2021-04-03: 90 mL

## 2021-04-03 MED ORDER — HEPARIN (PORCINE) IN NACL 1000-0.9 UT/500ML-% IV SOLN
INTRAVENOUS | Status: AC
  Filled 2021-04-03: qty 1000

## 2021-04-03 MED ORDER — HYDRALAZINE HCL 20 MG/ML IJ SOLN: 10.0000 mg | INTRAMUSCULAR | Status: DC | PRN

## 2021-04-03 MED ORDER — LIDOCAINE HCL (PF) 1 % IJ SOLN
INTRAMUSCULAR | Status: DC | PRN
  Administered 2021-04-03 (×2): 2 mL

## 2021-04-03 MED ORDER — FENTANYL CITRATE (PF) 100 MCG/2ML IJ SOLN
INTRAMUSCULAR | Status: AC
  Filled 2021-04-03: qty 2

## 2021-04-03 MED ORDER — HEPARIN SODIUM (PORCINE) 1000 UNIT/ML IJ SOLN
INTRAMUSCULAR | Status: AC
  Filled 2021-04-03: qty 1

## 2021-04-03 MED ORDER — LIDOCAINE HCL (PF) 1 % IJ SOLN
INTRAMUSCULAR | Status: AC
  Filled 2021-04-03: qty 30

## 2021-04-03 MED ORDER — MIDAZOLAM HCL 2 MG/2ML IJ SOLN
INTRAMUSCULAR | Status: DC | PRN
  Administered 2021-04-03: 0.5 mg via INTRAVENOUS
  Administered 2021-04-03: 1 mg via INTRAVENOUS

## 2021-04-03 MED ORDER — SODIUM CHLORIDE 0.9% FLUSH: 3.0000 mL | INTRAVENOUS | Status: DC | PRN

## 2021-04-03 MED ORDER — SODIUM CHLORIDE 0.9 % WEIGHT BASED INFUSION
3.0000 mL/kg/h | INTRAVENOUS | Status: AC
  Administered 2021-04-03: 3 mL/kg/h via INTRAVENOUS

## 2021-04-03 MED ORDER — VERAPAMIL HCL 2.5 MG/ML IV SOLN
INTRAVENOUS | Status: DC | PRN
  Administered 2021-04-03: 10 mL via INTRA_ARTERIAL

## 2021-04-03 MED ORDER — MIDAZOLAM HCL 2 MG/2ML IJ SOLN
INTRAMUSCULAR | Status: AC
  Filled 2021-04-03: qty 2

## 2021-04-03 MED ORDER — ACETAMINOPHEN 325 MG PO TABS: 650.0000 mg | ORAL_TABLET | ORAL | Status: DC | PRN

## 2021-04-03 MED ORDER — ASPIRIN 81 MG PO CHEW: 81.0000 mg | CHEWABLE_TABLET | ORAL | Status: DC

## 2021-04-03 MED ORDER — OXYCODONE HCL 5 MG PO TABS: 5.0000 mg | ORAL_TABLET | ORAL | Status: DC | PRN

## 2021-04-03 MED ORDER — HEPARIN SODIUM (PORCINE) 1000 UNIT/ML IJ SOLN
INTRAMUSCULAR | Status: DC | PRN
  Administered 2021-04-03: 4000 [IU] via INTRAVENOUS

## 2021-04-03 MED ORDER — SODIUM CHLORIDE 0.9 % IV SOLN: 250.0000 mL | INTRAVENOUS | Status: DC | PRN

## 2021-04-03 MED ORDER — ONDANSETRON HCL 4 MG/2ML IJ SOLN: 4.0000 mg | Freq: Four times a day (QID) | INTRAMUSCULAR | Status: DC | PRN

## 2021-04-03 MED ORDER — ISOSORBIDE MONONITRATE ER 30 MG PO TB24: 30.0000 mg | ORAL_TABLET | Freq: Every day | ORAL | 11 refills | Status: DC

## 2021-04-03 MED ORDER — SODIUM CHLORIDE 0.9% FLUSH: 3.0000 mL | Freq: Two times a day (BID) | INTRAVENOUS | Status: DC

## 2021-04-03 MED ORDER — SODIUM CHLORIDE 0.9 % IV SOLN: INTRAVENOUS | Status: DC

## 2021-04-03 MED ORDER — HEPARIN (PORCINE) IN NACL 1000-0.9 UT/500ML-% IV SOLN
INTRAVENOUS | Status: DC | PRN
  Administered 2021-04-03 (×2): 500 mL

## 2021-04-03 MED ORDER — SODIUM CHLORIDE 0.9 % WEIGHT BASED INFUSION: 1.0000 mL/kg/h | INTRAVENOUS | Status: DC

## 2021-04-03 MED ORDER — LABETALOL HCL 5 MG/ML IV SOLN: 10.0000 mg | INTRAVENOUS | Status: DC | PRN

## 2021-04-03 SURGICAL SUPPLY — 13 items
CATH BALLN WEDGE 5F 110CM (CATHETERS) ×1 IMPLANT
CATH INFINITI 5FR MULTPACK ANG (CATHETERS) ×1 IMPLANT
DEVICE RAD COMP TR BAND LRG (VASCULAR PRODUCTS) ×1 IMPLANT
GLIDESHEATH SLEND A-KIT 6F 22G (SHEATH) ×1 IMPLANT
GLIDESHEATH SLEND SS 6F .021 (SHEATH) ×1 IMPLANT
GUIDEWIRE INQWIRE 1.5J.035X260 (WIRE) IMPLANT
INQWIRE 1.5J .035X260CM (WIRE) ×2
KIT HEART LEFT (KITS) ×2 IMPLANT
PACK CARDIAC CATHETERIZATION (CUSTOM PROCEDURE TRAY) ×2 IMPLANT
SHEATH GLIDE SLENDER 4/5FR (SHEATH) ×1 IMPLANT
TRANSDUCER W/STOPCOCK (MISCELLANEOUS) ×2 IMPLANT
TUBING CIL FLEX 10 FLL-RA (TUBING) ×2 IMPLANT
WIRE HI TORQ VERSACORE-J 145CM (WIRE) ×1 IMPLANT

## 2021-04-03 NOTE — Interval H&P Note (Signed)
Cath Lab Visit (complete for each Cath Lab visit)  Clinical Evaluation Leading to the Procedure:   ACS: No.  Non-ACS:    Anginal Classification: CCS III  Anti-ischemic medical therapy: Minimal Therapy (1 class of medications)  Non-Invasive Test Results: No non-invasive testing performed  Prior CABG: Previous CABG      History and Physical Interval Note:  04/03/2021 7:25 AM  Micheal [name]  has presented today for surgery, with the diagnosis of chest pain.  The various methods of treatment have been discussed with the patient and family. After consideration of risks, benefits and other options for treatment, the patient has consented to  Procedure(s): RIGHT/LEFT HEART CATH AND CORONARY/GRAFT ANGIOGRAPHY (N/A) as a surgical intervention.  The patient's history has been reviewed, patient examined, no change in status, stable for surgery.  I have reviewed the patient's chart and labs.  Questions were answered to the patient's satisfaction.     Lyn Records III

## 2021-04-03 NOTE — CV Procedure (Signed)
Patent LIMA to LAD and sequential SVG to PDA and PL. Occluded SVG to a small circumflex which had less than 50% blockage at the time of bypass.   Patent left main Total occlusion of mid LAD.  Small circumflex with significant disease with up to 95% stenosis in a relatively small first obtuse marginal.  This is unlikely to have any relationship to the mitral valve regurgitation. Total occlusion of mid RCA. V wave to 35 mmHg but with mean capillary wedge pressure of 17 mmHg. Moderate pulmonary hypertension with mean PA pressure 30 mmHg and peak systolic pressure 55 mmHg  Chose against any intervention on the obtuse marginal.  We will add long-acting nitrate therapy, continue beta-blocker therapy, and consider PCI of circumflex if symptoms warrant.  Risk versus benefit is equivocal at this time given that he would have to be on antithrombotic therapy and antiplatelet therapy increasing the risk of bleeding.  Not sure current symptoms warrant intervention on chronic stable CAD.

## 2021-04-03 NOTE — Discharge Instructions (Addendum)
Resume Apixiban after 8 PM tonight.   Radial Site Care  This sheet gives you information about how to care for yourself after your procedure. Your health care provider may also give you more specific instructions. If you have problems or questions, contact your health care provider. What can I expect after the procedure? After the procedure, it is common to have: Bruising and tenderness at the catheter insertion area. Follow these instructions at home: Medicines Take over-the-counter and prescription medicines only as told by your health care provider. Insertion site care Follow instructions from your health care provider about how to take care of your insertion site. Make sure you: Wash your hands with soap and water before you remove your bandage (dressing). If soap and water are not available, use hand sanitizer. May remove dressing in 24 hours. Check your insertion site every day for signs of infection. Check for: Redness, swelling, or pain. Fluid or blood. Pus or a bad smell. Warmth. Do no take baths, swim, or use a hot tub for 5 days. You may shower 24-48 hours after the procedure. Remove the dressing and gently wash the site with plain soap and water. Pat the area dry with a clean towel. Do not rub the site. That could cause bleeding. Do not apply powder or lotion to the site. Activity  For 24 hours after the procedure, or as directed by your health care provider: Do not flex or bend the affected arm. Do not push or pull heavy objects with the affected arm. Do not drive yourself home from the hospital or clinic. You may drive 24 hours after the procedure. Do not operate machinery or power tools. KEEP ARM ELEVATED THE REMAINDER OF THE DAY. Do not push, pull or lift anything that is heavier than 10 lb for 5 days. Ask your health care provider when it is okay to: Return to work or school. Resume usual physical activities or sports. Resume sexual activity. General  instructions If the catheter site starts to bleed, raise your arm and put firm pressure on the site. If the bleeding does not stop, get help right away. This is a medical emergency. DRINK PLENTY OF FLUIDS FOR THE NEXT 2-3 DAYS. No alcohol consumption for 24 hours after receiving sedation. If you went home on the same day as your procedure, a responsible adult should be with you for the first 24 hours after you arrive home. Keep all follow-up visits as told by your health care provider. This is important. Contact a health care provider if: You have a fever. You have redness, swelling, or yellow drainage around your insertion site. Get help right away if: You have unusual pain at the radial site. The catheter insertion area swells very fast. The insertion area is bleeding, and the bleeding does not stop when you hold steady pressure on the area. Your arm or hand becomes pale, cool, tingly, or numb. These symptoms may represent a serious problem that is an emergency. Do not wait to see if the symptoms will go away. Get medical help right away. Call your local emergency services (911 in the [place] Do not drive yourself to the hospital. Summary After the procedure, it is common to have bruising and tenderness at the site. Follow instructions from your health care provider about how to take care of your radial site wound. Check the wound every day for signs of infection.  This information is not intended to replace advice given to you by your health care provider. Make  sure you discuss any questions you have with your health care provider. Document Revised: 08/19/2017 Document Reviewed: 08/19/2017 Elsevier Patient Education  2020 [employer].

## 2021-04-03 NOTE — Progress Notes (Signed)
Pt ambulated without difficulty or bleeding.   Discharged home with his friend, Lynden Ang, who will drive and his friend, Clydie Braun, who will stay with pt x 24 hrs.

## 2021-04-08 ENCOUNTER — Encounter: Payer: Self-pay | Admitting: Cardiovascular Disease

## 2021-04-08 ENCOUNTER — Other Ambulatory Visit: Payer: Self-pay

## 2021-04-08 ENCOUNTER — Ambulatory Visit (INDEPENDENT_AMBULATORY_CARE_PROVIDER_SITE_OTHER): Payer: Medicare Other | Admitting: Cardiovascular Disease

## 2021-04-08 VITALS — BP 144/66 | HR 73 | Ht 68.0 in | Wt 148.8 lb

## 2021-04-08 DIAGNOSIS — I2510 Atherosclerotic heart disease of native coronary artery without angina pectoris: Secondary | ICD-10-CM

## 2021-04-08 DIAGNOSIS — I340 Nonrheumatic mitral (valve) insufficiency: Secondary | ICD-10-CM | POA: Diagnosis not present

## 2021-04-08 NOTE — Patient Instructions (Signed)
Medication Instructions:  Your physician recommends that you continue on your current medications as directed. Please refer to the Current Medication list given to you today.  *If you need a refill on your cardiac medications before your next appointment, please call your pharmacy*   Lab Work: none If you have labs (blood work) drawn today and your tests are completely normal, you will receive your results only by: MyChart Message (if you have MyChart) OR A paper copy in the mail If you have any lab test that is abnormal or we need to change your treatment, we will call you to review the results.   Testing/Procedures: none   Follow-Up: At [facility], you and your health needs are our priority.  As part of our continuing mission to provide you with exceptional heart care, we have created designated Provider Care Teams.  These Care Teams include your primary Cardiologist (physician) and Advanced Practice Providers (APPs -  Physician Assistants and Nurse Practitioners) who all work together to provide you with the care you need, when you need it.  We recommend signing up for the patient portal called "MyChart".  Sign up information is provided on this After Visit Summary.  MyChart is used to connect with patients for Virtual Visits (Telemedicine).  Patients are able to view lab/test results, encounter notes, upcoming appointments, etc.  Non-urgent messages can be sent to your provider as well.   To learn more about what you can do with MyChart, go to [email or web address].    Your next appointment:   To be determined  The format for your next appointment:   In Person  Provider:   You may see Dr Excell Seltzer or one of the following Advanced Practice Providers on your designated Care Team:   Tereso Newcomer, PA-C Chelsea Aus, [place]   Other Instructions

## 2021-04-08 NOTE — Progress Notes (Signed)
Cardiology Office Note:    Date:  04/15/2021   ID:  Micheal [name], DOB 1940/10/25, MRN [id number]  PCP:  Lorenda Ishihara, MD   [facility] Providers Cardiologist:  Lesleigh Noe, MD     Referring MD: Lorenda Ishihara,*   Chief Complaint  Patient presents with   Shortness of Breath    History of Present Illness:    Micheal [name] is a 80 y.o. male with a hx of coronary artery disease status post CABG 2007, type 2 diabetes, persistent atrial fibrillation, now diagnosed with severe mitral regurgitation, presenting for follow-up evaluation.  The patient was initially seen March 05, 2021 for consideration of treatment options regarding severe mitral regurgitation.  He was recently diagnosed with atrial fibrillation February 24, 2021 when he presented with dyspnea, fatigue, and clinical signs of congestive heart failure.  He underwent TEE guided cardioversion February 26, 2021 when he was found to have moderately severe mitral regurgitation with bileaflet prolapse.  The patient subsequently underwent right and left heart catheterization 04/03/2021 demonstrating occlusion of the saphenous vein graft obtuse marginal with severe disease in the native OM, but this was noted to be a very small vessel.  It is felt to be best for medical therapy.  The sequential vein graft to PDA and PLA and the LIMA to LAD graft were both patent.  The patient had 32 mmHg V waves in the wedge position and moderate pulmonary hypertension, consistent with hemodynamically important mitral regurgitation.  He presents today for follow-up evaluation.  His symptoms are essentially unchanged and he admits to exertional dyspnea with moderate level activity.  He denies orthopnea or PND.  He does complain of fatigue and exercise intolerance as well.  No chest pain or pressure.  Seems to be tolerating his medical therapy well and he reports compliance with his medications.  Past Medical History:  Diagnosis Date   CAD  (coronary artery disease)    with Cabg 2008   Claudication [facility])    Diabetes (HCC)    HTN (hypertension)    Hypercholesteremia    Hyperlipidemia     Past Surgical History:  Procedure Laterality Date   ABDOMINAL AORTOGRAM W/LOWER EXTREMITY N/A 09/22/2018   Procedure: ABDOMINAL AORTOGRAM W/LOWER EXTREMITY;  Surgeon: Iran Ouch, MD;  [facility] INVASIVE CV LAB;  Service: Cardiovascular;  Laterality: N/A;   CARDIOVERSION N/A 02/26/2021   Procedure: CARDIOVERSION;  Surgeon: Elease Hashimoto Deloris Ping, MD;  Location: [facility] ENDOSCOPY;  Service: Cardiovascular;  Laterality: N/A;   Coronary bypass surgery     2008   RIGHT/LEFT HEART CATH AND CORONARY/GRAFT ANGIOGRAPHY N/A 04/03/2021   Procedure: RIGHT/LEFT HEART CATH AND CORONARY/GRAFT ANGIOGRAPHY;  Surgeon: Lyn Records, MD;  Location: [facility] INVASIVE CV LAB;  Service: Cardiovascular;  Laterality: N/A;   TEE WITHOUT CARDIOVERSION N/A 02/26/2021   Procedure: TRANSESOPHAGEAL ECHOCARDIOGRAM (TEE);  Surgeon: Elease Hashimoto Deloris Ping, MD;  Location: [facility] ENDOSCOPY;  Service: Cardiovascular;  Laterality: N/A;    Current Medications: Current Meds  Medication Sig   Acetylcysteine (NAC 600) 600 MG CAPS Take 600 mg by mouth daily.   apixaban (ELIQUIS) 5 MG TABS tablet Take 1 tablet (5 mg total) by mouth 2 (two) times daily.   atorvastatin (LIPITOR) 20 MG tablet Take 20 mg by mouth daily.   brinzolamide (AZOPT) 1 % ophthalmic suspension INSTILL 1 DROP IN EACH EYE TWICE A DAY   Cholecalciferol (VITAMIN D) 50 MCG (2000 UT) tablet Take 2,000 Units by mouth in the morning and at bedtime.  CINNAMON PO Take 1,000 mg by mouth daily.   Coenzyme Q10 (CO Q-10) 200 MG CAPS Take 200 mg by mouth daily.    dorzolamide-timolol (COSOPT) 22.3-6.8 MG/ML ophthalmic solution Place 1 drop into both eyes 2 (two) times daily.   Glucosamine-Chondroitin (GLUCOSAMINE CHONDR COMPLEX PO) Take 1 tablet by mouth daily.   isosorbide mononitrate (IMDUR) 30 MG 24 hr tablet Take 1 tablet (30 mg total) by  mouth daily.   levOCARNitine (L-CARNITINE) 500 MG TABS Take 1 tablet by mouth in the morning.   losartan-hydrochlorothiazide (HYZAAR) 50-12.5 MG tablet TAKE 1 TABLET BY MOUTH EVERY DAY (Patient taking differently: Take 1 tablet by mouth daily.)   metFORMIN (GLUCOPHAGE) 1000 MG tablet Take 1,000 mg by mouth at bedtime.    metoprolol succinate (TOPROL-XL) 50 MG 24 hr tablet TAKE 1 TABLET BY MOUTH EVERY DAY (Patient taking differently: Take 50 mg by mouth daily.)   Misc Natural Products (NF FORMULAS TESTOSTERONE) CAPS Take 1 capsule by mouth in the morning.   Multiple Vitamins-Minerals (PRESERVISION AREDS 2 PO) Take 1 capsule by mouth 2 (two) times daily.   Omega-3 Fatty Acids (FISH OIL PO) Take 1 capsule by mouth daily.   pravastatin (PRAVACHOL) 20 MG tablet Take 20 mg by mouth at bedtime.   Propylene Glycol (SYSTANE BALANCE) 0.6 % SOLN Place 1 drop into both eyes daily as needed (dry eyes).   QUERCETIN PO Take 1 tablet by mouth daily.   vitamin B-12 (CYANOCOBALAMIN) 1000 MCG tablet Take 1,000 mcg by mouth daily.   vitamin E 400 UNIT capsule Take 400 Units by mouth daily.   VYZULTA 0.024 % SOLN Place 1 drop into the right eye at bedtime.     Allergies:   Patient has no known allergies.   Social History   Socioeconomic History   Marital status: Widowed    Spouse name: Not on file   Number of children: Not on file   Years of education: Not on file   Highest education level: Not on file  Occupational History   Not on file  Tobacco Use   Smoking status: Former   Smokeless tobacco: Never  Substance and Sexual Activity   Alcohol use: Yes    Alcohol/week: 1.0 standard drink    Types: 1 Glasses of wine per week   Drug use: No   Sexual activity: Yes  Other Topics Concern   Not on file  Social History Narrative   Not on file   Social Determinants of Health   Financial Resource Strain: Not on file  Food Insecurity: Not on file  Transportation Needs: Not on file  Physical Activity:  Not on file  Stress: Not on file  Social Connections: Not on file     Family History: The patient's family history includes Healthy in his sister, sister, and sister; Heart attack in his maternal grandfather; Heart disease in his father.  ROS:   Please see the history of present illness.    All other systems reviewed and are negative.  EKGs/Labs/Other Studies Reviewed:    The following studies were reviewed today: Cardiac Cath: Conclusion      Occluded saphenous vein graft to obtuse marginal.   Obtuse marginal, relatively small with focal 95% stenosis.  Decided against PCI given relatively small size of obtuse marginal and requirement for antithrombotic and antiplatelet therapy increasing the risk of bleeding.  Will enhance anti-ischemic therapy and if symptoms refractory, return later for PCI of OM.   Patent sequential vein graft to PDA and  PL   Widely patent left main   Totally occluded proximal to mid LAD   Circumflex as noted above the small and the small to moderate-sized obtuse marginal contains 95% stenosis.  At the time of grafting there was less than 50% stenosis in the region where the stenosis is currently located.   Total occlusion of proximal to mid RCA   Mild pulmonary hypertension with mean PA pressure 30 mmHg; V wave 34 mmHg.   Cardiac output 4.9 L/min   Pulmonary vascular resistance 2.7 Woods units   Initiate isosorbide mononitrate 30 mg/day and titrate up for both relief of symptoms related to mitral regurgitation and potential angina from obtuse marginal. If unacceptable symptoms, consider PCI of the relatively small obtuse marginal from right radial or femoral approach. Return to the structural heart clinic for consideration of further therapy by Dr. Excell Seltzer relative to mitral regurgitation.  Diagnostic Dominance: Right Left Main  There is moderate diffuse disease throughout the vessel.  Left Anterior Descending  There is moderate diffuse disease throughout  the vessel.  Prox LAD to Mid LAD lesion is 100% stenosed.  Left Circumflex  First Obtuse Marginal Branch  Vessel is small in size. There is mild disease in the vessel.  1st Mrg lesion is 95% stenosed.  Second Obtuse Marginal Branch  Vessel is small in size.  Right Coronary Artery  There is moderate diffuse disease throughout the vessel.  Prox RCA lesion is 100% stenosed.  Right Posterior Atrioventricular Artery  There is moderate disease in the vessel.  LIMA Graft To Mid LAD  Graft To 1st Mrg  Origin to Prox Graft lesion is 100% stenosed.  Sequential Graft To RPDA, 2nd RPL  Intervention  No interventions have been documented. Right Heart  Right Heart Pressures Hemodynamic findings consistent with mild pulmonary hypertension. LV EDP is normal.   Left Heart  Left Ventricle The left ventricular size is normal. The left ventricular systolic function is normal. LV end diastolic pressure is normal. The left ventricular ejection fraction is 55-65% by visual estimate. No regional wall motion abnormalities. There is moderate to severe mitral regurgitation.  Mitral Valve There is moderate (3+) mitral regurgitation. V wave 32 mmHg   Coronary Diagrams   Diagnostic Dominance: Right     Recent Labs: 02/24/2021: B Natriuretic Peptide 1,394.6 02/25/2021: TSH 1.897 03/05/2021: Platelets 175 04/03/2021: BUN 15; Creatinine, Ser 0.80; Hemoglobin 12.2; Potassium 4.0; Sodium 141  Recent Lipid Panel    Component Value Date/Time   CHOL 130 02/25/2021 0249   CHOL 86 (L) 10/10/2019 0744   TRIG 102 02/25/2021 0249   HDL 35 (L) 02/25/2021 0249   HDL 44 10/10/2019 0744   CHOLHDL 3.7 02/25/2021 0249   VLDL 20 02/25/2021 0249   LDLCALC 75 02/25/2021 0249   LDLCALC 24 10/10/2019 0744            Physical Exam:    VS:  BP (!) 144/66   Pulse 73   Ht 5\' 8"  (1.727 m)   Wt 148 lb 12.8 oz (67.5 kg)   SpO2 95%   BMI 22.62 kg/m     Wt Readings from Last 3 Encounters:  04/08/21 148 lb 12.8 oz  (67.5 kg)  04/03/21 140 lb (63.5 kg)  03/05/21 149 lb 6.4 oz (67.8 kg)     GEN:  Well nourished, well developed in no acute distress HEENT: Normal NECK: No JVD; No carotid bruits LYMPHATICS: No lymphadenopathy CARDIAC: irregularly irregular with 4/6 holosystolic murmur at the apex RESPIRATORY:  Clear to auscultation without rales, wheezing or rhonchi  ABDOMEN: Soft, non-tender, non-distended MUSCULOSKELETAL:  trace bilateral pretibial edema; No deformity  SKIN: Warm and dry NEUROLOGIC:  Alert and oriented x 3 PSYCHIATRIC:  Normal affect   ASSESSMENT:    1. Nonrheumatic mitral (valve) insufficiency    PLAN:    In order of problems listed above:  The patient remains symptomatic with NYHA functional class 2 symptoms of chronic diastolic heart failure in the setting of severe mitral regurgitation. Supportive evidence of severe MR include large V waves at cardiac cath, bileaflet prolapse with an eccentric jet of MR wrapping back to the posterior wall of the LA, and blunted pulmonary vein flow. Cardiac cath demonstrates CAD that is appropriate for ongoing medical therapy. With recent atrial fibrillation and pulmonary HTN confirmed at cardiac cath, I suspect mitral regurgitation is playing a role in both of these processes. We discussed treatment options at length today. This included discussion of medical therapy, TEER with MitraClip, and surgical valve repair. I suspect with his age and comorbid conditions including prior sternotomy, TEER might be the preferred treatment option. The patient will be referred for formal cardiac surgical consultation as part of a multidisciplinary team approach to his care. As part of his evaluation today, we reviewed risks, indications, and alternatives to the MitraClip transcatheter edge to edge repair procedure. He understands the procedure is performed under general anesthesia via percutaneous transfemoral venous access with major complication rates of  approximately 2%. These complications could include vascular injury, bleeding, cardiac injury, cardiac tamponade requiring pericardiocentesis or emergency surgery, stroke, myocardial infarction, device embolization, mitral valve injury, and death. He provides full informed consent for the procedure pending his formal cardiac surgical evaluation.         Medication Adjustments/Labs and Tests Ordered: Current medicines are reviewed at length with the patient today.  Concerns regarding medicines are outlined above.  No orders of the defined types were placed in this encounter.  No orders of the defined types were placed in this encounter.   Patient Instructions  Medication Instructions:  Your physician recommends that you continue on your current medications as directed. Please refer to the Current Medication list given to you today.  *If you need a refill on your cardiac medications before your next appointment, please call your pharmacy*   Lab Work: none If you have labs (blood work) drawn today and your tests are completely normal, you will receive your results only by: MyChart Message (if you have MyChart) OR A paper copy in the mail If you have any lab test that is abnormal or we need to change your treatment, we will call you to review the results.   Testing/Procedures: none   Follow-Up: At [facility], you and your health needs are our priority.  As part of our continuing mission to provide you with exceptional heart care, we have created designated Provider Care Teams.  These Care Teams include your primary Cardiologist (physician) and Advanced Practice Providers (APPs -  Physician Assistants and Nurse Practitioners) who all work together to provide you with the care you need, when you need it.  We recommend signing up for the patient portal called "MyChart".  Sign up information is provided on this After Visit Summary.  MyChart is used to connect with patients for Virtual  Visits (Telemedicine).  Patients are able to view lab/test results, encounter notes, upcoming appointments, etc.  Non-urgent messages can be sent to your provider as well.   To learn more  about what you can do with MyChart, go to [email or web address].    Your next appointment:   To be determined  The format for your next appointment:   In Person  Provider:   You may see Dr Excell Seltzer or one of the following Advanced Practice Providers on your designated Care Team:   Tereso Newcomer, PA-C [place], [place]   Other Instructions    Signed, Tonny Bollman, MD  04/15/2021 4:29 PM    [facility]

## 2021-04-15 ENCOUNTER — Encounter: Payer: Self-pay | Admitting: Cardiovascular Disease

## 2021-04-17 ENCOUNTER — Other Ambulatory Visit: Payer: Self-pay

## 2021-04-17 DIAGNOSIS — I340 Nonrheumatic mitral (valve) insufficiency: Secondary | ICD-10-CM

## 2021-04-18 ENCOUNTER — Ambulatory Visit: Payer: Medicare Other | Attending: Cardiovascular Disease | Admitting: Physical Therapy

## 2021-04-18 ENCOUNTER — Ambulatory Visit: Payer: Medicare Other | Admitting: Interventional Cardiology

## 2021-04-18 ENCOUNTER — Other Ambulatory Visit: Payer: Self-pay

## 2021-04-18 ENCOUNTER — Encounter: Payer: Self-pay | Admitting: Physical Therapy

## 2021-04-18 DIAGNOSIS — R293 Abnormal posture: Secondary | ICD-10-CM | POA: Insufficient documentation

## 2021-04-18 NOTE — Therapy (Signed)
[facility] [address] [place], [place], [address] Phone: [phone]   Fax:  [phone]  Physical Therapy Evaluation  Patient Details  Name: Micheal [name] MRN: [id number] Date of Birth: 1940/12/21 Referring Provider (PT): Tonny Bollman MD   Encounter Date: 04/18/2021   PT End of Session - 04/18/21 1505     Visit Number 1    Number of Visits 1    Date for PT Re-Evaluation 04/18/21    PT Start Time 1501    PT Stop Time 1533    PT Time Calculation (min) 32 min    Activity Tolerance Patient tolerated treatment well    Behavior During Therapy [facility] for tasks assessed/performed             Past Medical History:  Diagnosis Date   CAD (coronary artery disease)    with Cabg 2008   Claudication (HCC)    Diabetes (HCC)    HTN (hypertension)    Hypercholesteremia    Hyperlipidemia     Past Surgical History:  Procedure Laterality Date   ABDOMINAL AORTOGRAM W/LOWER EXTREMITY N/A 09/22/2018   Procedure: ABDOMINAL AORTOGRAM W/LOWER EXTREMITY;  Surgeon: Iran Ouch, MD;  Location: [facility] INVASIVE CV LAB;  Service: Cardiovascular;  Laterality: N/A;   CARDIOVERSION N/A 02/26/2021   Procedure: CARDIOVERSION;  Surgeon: Elease Hashimoto Deloris Ping, MD;  Location: [facility] ENDOSCOPY;  Service: Cardiovascular;  Laterality: N/A;   Coronary bypass surgery     2008   RIGHT/LEFT HEART CATH AND CORONARY/GRAFT ANGIOGRAPHY N/A 04/03/2021   Procedure: RIGHT/LEFT HEART CATH AND CORONARY/GRAFT ANGIOGRAPHY;  Surgeon: Lyn Records, MD;  Location: [facility] INVASIVE CV LAB;  Service: Cardiovascular;  Laterality: N/A;   TEE WITHOUT CARDIOVERSION N/A 02/26/2021   Procedure: TRANSESOPHAGEAL ECHOCARDIOGRAM (TEE);  Surgeon: Elease Hashimoto Deloris Ping, MD;  Location: [facility] ENDOSCOPY;  Service: Cardiovascular;  Laterality: N/A;    There were no vitals filed for this visit.    Subjective Assessment - 04/18/21 1507     Subjective pt is a 80 y.o with CC of SOB that started on 7/31  secondary to marked SOB with activity and was hospitalized receiving carioversion and diuresis. Currently denies any chest pain, and SOB is back to baseline.  He reports having lower leg pain that occurs with increase in pace, but notes he can go at a constant rate without issues.    Patient Stated Goals to fix heart    Currently in Pain? No/denies                [facility] PT Assessment - 04/18/21 0001       Assessment   Medical Diagnosis Severe mitral insuffciency    Referring Provider (PT) Tonny Bollman MD    Onset Date/Surgical Date --   2 months ago   Hand Dominance Right    Prior Therapy no      Precautions   Precautions None      Restrictions   Weight Bearing Restrictions No      Balance Screen   Has the patient fallen in the past 6 months No    Has the patient had a decrease in activity level because of a fear of falling?  No    Is the patient reluctant to leave their home because of a fear of falling?  No      Home Environment   Living Environment Private residence    Living Arrangements Alone    Available Help at Discharge Neighbor    Type of  Home Apartment    Home Access Level entry    Home Layout One level    Home Equipment None      Prior Function   Level of Independence Independent with basic ADLs    Vocation Part time employment   computer work     Cognition   Overall Cognitive Status Within Functional Limits for tasks assessed      ROM / Strength   AROM / PROM / Strength AROM;Strength      AROM   Overall AROM  Within functional limits for tasks performed      Strength   Overall Strength Within functional limits for tasks performed    Strength Assessment Site Hand    Right Hand Grip (lbs) 76    Left Hand Grip (lbs) 74              [facility] Pre-Surgical Assessment - 04/18/21 0001     5 Meter Walk Test- trial 1 4 sec    5 Meter Walk Test- trial 2 3 sec.     5 Meter Walk Test- trial 3 3 sec.    5 meter walk test average 3.33 sec    4 Stage  Balance Test tolerated for:  5 sec.    4 Stage Balance Test Position 4    Sit To Stand Test- trial 1 13 sec.    ADL/IADL Independent with: Bathing;Dressing;Meal prep;Finances    ADL/IADL Needs Assistance with: Yard work    6 Minute Walk- Baseline yes    BP (mmHg) 131/74    HR (bpm) 60    02 Sat (%RA) 99 %    Modified Borg Scale for Dyspnea 0.5- Very, very slight shortness of breath    Perceived Rate of Exertion (Borg) 9- very light    6 Minute Walk Post Test yes    BP (mmHg) 162/85    HR (bpm) 88    02 Sat (%RA) 91 %    Modified Borg Scale for Dyspnea 4- somewhat severe    Perceived Rate of Exertion (Borg) 14-    Aerobic Endurance Distance Walked 555    Endurance additional comments pt is 59.43% limited compard to age related norm                      Objective measurements completed on examination: See above findings.                             Plan - 04/18/21 1536     Clinical Impression Statement see assessment in note    Stability/Clinical Decision Making Stable/Uncomplicated    Clinical Decision Making Low    PT Frequency One time visit    PT Next Visit Plan Pre Mitraclip procedure              Clinical Impression Statement: Pt is a 80 yo M presenting to OP PT for evaluation prior to possible Mitraclip surgery due to severe mitral regurgitation. Pt reports onset of SOB approximately 2 months ago. Symptoms are limiting prolonged walking/ standing. Pt presents with good ROM and strength, good balance and is assessed as low at high fall risk 4 stage balance test, good walking speed and fair aerobic endurance per 6 minute walk test. Pt ambulated 555 feet in 2:55 before requesting a seated rest beak lasting remainder of the test. At time of rest, patient's HR was 88 bpm and O2 was 91% on room  air. Pt reported 4/10 shortness of breath on modified scale for dyspnea. Pt ambulated a total of 555 feet in 6 minute walk. SOB, and bil thigh pain  increased significantly with 6 minute walk test. Based on the Short Physical Performance Battery, patient has a frailty rating of 11/12 with </= 5/12 considered frail.    Patient demonstrated the following deficits and impairments:     Visit Diagnosis: Abnormal posture     Problem List Patient Active Problem List   Diagnosis Date Noted   Moderate to severe mitral regurgitation 04/03/2021   New onset atrial fibrillation (HCC) 02/25/2021   Congestive heart failure (HCC) 02/24/2021   Coronary artery disease involving coronary bypass graft of native heart without angina pectoris 02/23/2014   CAD (coronary artery disease)    HTN (hypertension)    Hyperlipidemia    Diabetes (HCC)    Claudication (HCC)    Lulu Riding PT, DPT, LAT, ATC  04/18/21  3:39 PM      [facility] [address] [place], [place], [address] Phone: [phone]   Fax:  [phone]  Name: Micheal [name] MRN: [id number] Date of Birth: [birth year]

## 2021-04-23 NOTE — H&P (View-Only) (Signed)
    [address]       [place] [address]             [phone]        Micheal [name] [facility] Medical Record #[id number] Date of Birth: 12/12/1940  Referring: Cooper, Michael, MD Primary Care: Varadarajan, Rupashree, MD Primary Cardiologist:Henry W Smith III, MD  Chief Complaint:    Chief Complaint  Patient presents with   Mitral Regurgitation    Initial surgical consult, Review Mitraclip workup    History of Present Illness:     Micheal [name] is an [age]-year-old male with a history of coronary artery disease who underwent a CABG in 2007.  He is also been diagnosed with severe mitral valve regurgitation.  In regards to his symptoms he endorses exertional dyspnea however he remains very active.  He continues to play golf 3 times a week without much difficulty.  He is since for evaluation to discuss the potential for surgical valve repair versus MitraClip.   Past Medical History:  Diagnosis Date   CAD (coronary artery disease)    with Cabg 2008   Claudication (HCC)    Diabetes (HCC)    HTN (hypertension)    Hypercholesteremia    Hyperlipidemia     Past Surgical History:  Procedure Laterality Date   ABDOMINAL AORTOGRAM W/LOWER EXTREMITY N/A 09/22/2018   Procedure: ABDOMINAL AORTOGRAM W/LOWER EXTREMITY;  Surgeon: Arida, Muhammad A, MD;  [facility]: [facility];  Service: Cardiovascular;  Laterality: N/A;   CARDIOVERSION N/A 02/26/2021   Procedure: CARDIOVERSION;  Surgeon: Nahser, Philip J, MD;  Location: [facility];  Service: Cardiovascular;  Laterality: N/A;   Coronary bypass surgery     2008   RIGHT/LEFT HEART CATH AND CORONARY/GRAFT ANGIOGRAPHY N/A 04/03/2021   Procedure: RIGHT/LEFT HEART CATH AND CORONARY/GRAFT ANGIOGRAPHY;  Surgeon: Smith, Henry W, MD;  Location: [facility];  Service: Cardiovascular;  Laterality: N/A;   TEE WITHOUT CARDIOVERSION N/A 02/26/2021   Procedure: TRANSESOPHAGEAL ECHOCARDIOGRAM (TEE);  Surgeon: Nahser, Philip  J, MD;  Location: [facility];  Service: Cardiovascular;  Laterality: N/A;     Social History   Tobacco Use  Smoking Status Former  Smokeless Tobacco Never    Social History   Substance and Sexual Activity  Alcohol Use Yes   Alcohol/week: 1.0 standard drink   Types: 1 Glasses of wine per week     No Known Allergies    Current Outpatient Medications  Medication Sig Dispense Refill   Acetylcysteine (NAC 600) 600 MG CAPS Take 600 mg by mouth daily.     apixaban (ELIQUIS) 5 MG TABS tablet Take 1 tablet (5 mg total) by mouth 2 (two) times daily. 60 tablet 2   atorvastatin (LIPITOR) 20 MG tablet Take 20 mg by mouth daily.     brinzolamide (AZOPT) 1 % ophthalmic suspension INSTILL 1 DROP IN EACH EYE TWICE A DAY     Cholecalciferol (VITAMIN D) 50 MCG (2000 UT) tablet Take 2,000 Units by mouth in the morning and at bedtime.     CINNAMON PO Take 1,000 mg by mouth daily.     Coenzyme Q10 (CO Q-10) 200 MG CAPS Take 200 mg by mouth daily.      dorzolamide-timolol (COSOPT) 22.3-6.8 MG/ML ophthalmic solution Place 1 drop into both eyes 2 (two) times daily.     Glucosamine-Chondroitin (GLUCOSAMINE CHONDR COMPLEX PO) Take 1 tablet by mouth daily.     isosorbide mononitrate (IMDUR) 30 MG 24   hr tablet Take 1 tablet (30 mg total) by mouth daily. 30 tablet 11   levOCARNitine (L-CARNITINE) 500 MG TABS Take 1 tablet by mouth in the morning.     losartan-hydrochlorothiazide (HYZAAR) 50-12.5 MG tablet TAKE 1 TABLET BY MOUTH EVERY DAY (Patient taking differently: Take 1 tablet by mouth daily.) 90 tablet 2   metFORMIN (GLUCOPHAGE) 1000 MG tablet Take 1,000 mg by mouth at bedtime.      metoprolol succinate (TOPROL-XL) 50 MG 24 hr tablet TAKE 1 TABLET BY MOUTH EVERY DAY (Patient taking differently: Take 50 mg by mouth daily.) 90 tablet 1   Misc Natural Products (NF FORMULAS TESTOSTERONE) CAPS Take 1 capsule by mouth in the morning.     Multiple Vitamins-Minerals (PRESERVISION AREDS 2 PO) Take 1 capsule  by mouth 2 (two) times daily.     Omega-3 Fatty Acids (FISH OIL PO) Take 1 capsule by mouth daily.     pravastatin (PRAVACHOL) 20 MG tablet Take 20 mg by mouth at bedtime.     Propylene Glycol (SYSTANE BALANCE) 0.6 % SOLN Place 1 drop into both eyes daily as needed (dry eyes).     QUERCETIN PO Take 1 tablet by mouth daily.     vitamin B-12 (CYANOCOBALAMIN) 1000 MCG tablet Take 1,000 mcg by mouth daily.     vitamin E 400 UNIT capsule Take 400 Units by mouth daily.     VYZULTA 0.024 % SOLN Place 1 drop into the right eye at bedtime.     No current facility-administered medications for this visit.    (Not in a hospital admission)   Family History  Problem Relation Age of Onset   Heart disease Father    Healthy Sister    Heart attack Maternal Grandfather    Healthy Sister    Healthy Sister      Review of Systems:   Review of Systems  Constitutional:  Positive for malaise/fatigue and weight loss.  Respiratory:  Positive for shortness of breath.   Cardiovascular:  Negative for chest pain and leg swelling.     Physical Exam: BP (!) 147/72 (BP Location: Right Arm, Patient Position: Sitting)   Pulse 68   Resp 18   Ht 5\' 8"  (1.727 m)   Wt 141 lb (64 kg)   SpO2 92% Comment: RA  BMI 21.44 kg/m  Physical Exam Constitutional:      General: He is not in acute distress.    Appearance: Normal appearance. He is not ill-appearing.  HENT:     Head: Normocephalic and atraumatic.  Eyes:     Extraocular Movements: Extraocular movements intact.  Cardiovascular:     Rate and Rhythm: Normal rate.     Heart sounds: Murmur heard.  Pulmonary:     Effort: Pulmonary effort is normal. No respiratory distress.  Musculoskeletal:     Cervical back: Normal range of motion.  Skin:    General: Skin is warm and dry.  Neurological:     Mental Status: He is alert.      Diagnostic Studies & Laboratory data:    Left Heart Catherization: 04/03/2021 Occluded saphenous vein graft to obtuse  marginal.   Obtuse marginal, relatively small with focal 95% stenosis.  Decided against PCI given relatively small size of obtuse marginal and requirement for antithrombotic and antiplatelet therapy increasing the risk of bleeding.  Will enhance anti-ischemic therapy and if symptoms refractory, return later for PCI of OM.   Patent sequential vein graft to PDA and PL   Widely patent  left main   Totally occluded proximal to mid LAD   Circumflex as noted above the small and the small to moderate-sized obtuse marginal contains 95% stenosis.  At the time of grafting there was less than 50% stenosis in the region where the stenosis is currently located.   Total occlusion of proximal to mid RCA   Mild pulmonary hypertension with mean PA pressure 30 mmHg; V wave 34 mmHg.   Cardiac output 4.9 L/min   Pulmonary vascular resistance 2.7 Woods units Echo: 02/26/2021 transesophageal echocardiogram IMPRESSIONS     1. Left ventricular ejection fraction, by estimation, is 60 to 65%. The  left ventricle has normal function.   2. Right ventricular systolic function is normal. The right ventricular  size is normal.   3. No left atrial/left atrial appendage thrombus was detected.   4. There was no reversal of flow in the pulmonary arteries.      . The mitral valve is grossly normal. Moderate to severe mitral valve  regurgitation. There is mild holosystolic prolapse of both leaflets of the  mitral valve.   5. Tricuspid valve regurgitation is mild to moderate.   6. The aortic valve is tricuspid. Aortic valve regurgitation is not  visualized.   EKG: Atrial fibrillation I have independently reviewed the above radiologic studies and discussed with the patient   Recent Lab Findings: Lab Results  Component Value Date   WBC 5.9 03/05/2021   HGB 12.2 (L) 04/03/2021   HCT 36.0 (L) 04/03/2021   PLT 175 03/05/2021   GLUCOSE 150 (H) 04/03/2021   CHOL 130 02/25/2021   TRIG 102 02/25/2021   HDL 35 (L) 02/25/2021    LDLCALC 75 02/25/2021   ALT 20 10/10/2019   AST 19 10/10/2019   NA 141 04/03/2021   K 4.0 04/03/2021   CL 104 04/03/2021   CREATININE 0.80 04/03/2021   BUN 15 04/03/2021   CO2 28 03/05/2021   TSH 1.897 02/25/2021   INR 1.4 (H) 02/25/2021   HGBA1C 6.4 (H) 02/25/2021      Assessment / Plan:   80 yo male with hx of CABG, now has severe mitral valve regurgitation.  Although he remains active and is in good shape for his age he is extremely high risk for redo sternotomy and mitral valve repair.  I think that he would be better served with MitraClip placement.  I will speak with Dr.Cooper to confirm this.    I  spent 40 minutes counseling the patient face to face.   Micheal [name] 04/26/2021 5:54 PM       

## 2021-04-23 NOTE — Progress Notes (Signed)
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  He is also been diagnosed with severe mitral valve regurgitation.  In regards to his symptoms he endorses exertional dyspnea however he remains very active.  He continues to play golf 3 times a week without much difficulty.  He is since for evaluation to discuss the potential for surgical valve repair versus MitraClip.   Past Medical History:  Diagnosis Date   CAD (coronary artery disease)    with Cabg 2008   Claudication [facility])    Diabetes (HCC)    HTN (hypertension)    Hypercholesteremia    Hyperlipidemia     Past Surgical History:  Procedure Laterality Date   ABDOMINAL AORTOGRAM W/LOWER EXTREMITY N/A 09/22/2018   Procedure: ABDOMINAL AORTOGRAM W/LOWER EXTREMITY;  Surgeon: Iran Ouch, MD;  [facility]: [facility];  Service: Cardiovascular;  Laterality: N/A;   CARDIOVERSION N/A 02/26/2021   Procedure: CARDIOVERSION;  Surgeon: Elease Hashimoto Deloris Ping, MD;  Location: [facility] ENDOSCOPY;  Service: Cardiovascular;  Laterality: N/A;   Coronary bypass surgery     2008   RIGHT/LEFT HEART CATH AND CORONARY/GRAFT ANGIOGRAPHY N/A 04/03/2021   Procedure: RIGHT/LEFT HEART CATH AND CORONARY/GRAFT ANGIOGRAPHY;  Surgeon: Lyn Records, MD;  [facility]: [facility];  Service: Cardiovascular;  Laterality: N/A;   TEE WITHOUT CARDIOVERSION N/A 02/26/2021   Procedure: TRANSESOPHAGEAL ECHOCARDIOGRAM (TEE);  Surgeon: Vesta Mixer, MD;  Location: [facility] ENDOSCOPY;  Service: Cardiovascular;  Laterality: N/A;     Social History   Tobacco Use  Smoking Status Former  Smokeless Tobacco Never    Social History   Substance and Sexual Activity  Alcohol Use Yes   Alcohol/week: 1.0 standard drink   Types: 1 Glasses of wine per week     No Known Allergies    Current Outpatient Medications  Medication Sig Dispense Refill   Acetylcysteine (NAC 600) 600 MG CAPS Take 600 mg by mouth daily.     apixaban (ELIQUIS) 5 MG TABS tablet Take 1 tablet (5 mg total) by mouth 2 (two) times daily. 60 tablet 2   atorvastatin (LIPITOR) 20 MG tablet Take 20 mg by mouth daily.     brinzolamide (AZOPT) 1 % ophthalmic suspension INSTILL 1 DROP IN EACH EYE TWICE A DAY     Cholecalciferol (VITAMIN D) 50 MCG (2000 UT) tablet Take 2,000 Units by mouth in the morning and at bedtime.     CINNAMON PO Take 1,000 mg by mouth daily.     Coenzyme Q10 (CO Q-10) 200 MG CAPS Take 200 mg by mouth daily.      dorzolamide-timolol (COSOPT) 22.3-6.8 MG/ML ophthalmic solution Place 1 drop into both eyes 2 (two) times daily.     Glucosamine-Chondroitin (GLUCOSAMINE CHONDR COMPLEX PO) Take 1 tablet by mouth daily.     isosorbide mononitrate (IMDUR) 30 MG 24  hr tablet Take 1 tablet (30 mg total) by mouth daily. 30 tablet 11   levOCARNitine (L-CARNITINE) 500 MG TABS Take 1 tablet by mouth in the morning.     losartan-hydrochlorothiazide (HYZAAR) 50-12.5 MG tablet TAKE 1 TABLET BY MOUTH EVERY DAY (Patient taking differently: Take 1 tablet by mouth daily.) 90 tablet 2   metFORMIN (GLUCOPHAGE) 1000 MG tablet Take 1,000 mg by mouth at bedtime.      metoprolol succinate (TOPROL-XL) 50 MG 24 hr tablet TAKE 1 TABLET BY MOUTH EVERY DAY (Patient taking differently: Take 50 mg by mouth daily.) 90 tablet 1   Misc Natural Products (NF FORMULAS TESTOSTERONE) CAPS Take 1 capsule by mouth in the morning.     Multiple Vitamins-Minerals (PRESERVISION AREDS 2 PO) Take 1 capsule  by mouth 2 (two) times daily.     Omega-3 Fatty Acids (FISH OIL PO) Take 1 capsule by mouth daily.     pravastatin (PRAVACHOL) 20 MG tablet Take 20 mg by mouth at bedtime.     Propylene Glycol (SYSTANE BALANCE) 0.6 % SOLN Place 1 drop into both eyes daily as needed (dry eyes).     QUERCETIN PO Take 1 tablet by mouth daily.     vitamin B-12 (CYANOCOBALAMIN) 1000 MCG tablet Take 1,000 mcg by mouth daily.     vitamin E 400 UNIT capsule Take 400 Units by mouth daily.     VYZULTA 0.024 % SOLN Place 1 drop into the right eye at bedtime.     No current facility-administered medications for this visit.    (Not in a hospital admission)   Family History  Problem Relation Age of Onset   Heart disease Father    Healthy Sister    Heart attack Maternal Grandfather    Healthy Sister    Healthy Sister      Review of Systems:   Review of Systems  Constitutional:  Positive for malaise/fatigue and weight loss.  Respiratory:  Positive for shortness of breath.   Cardiovascular:  Negative for chest pain and leg swelling.     Physical Exam: BP (!) 147/72 (BP Location: Right Arm, Patient Position: Sitting)   Pulse 68   Resp 18   Ht 5\' 8"  (1.727 m)   Wt 141 lb (64 kg)   SpO2 92% Comment: RA  BMI 21.44 kg/m  Physical Exam Constitutional:      General: He is not in acute distress.    Appearance: Normal appearance. He is not ill-appearing.  HENT:     Head: Normocephalic and atraumatic.  Eyes:     Extraocular Movements: Extraocular movements intact.  Cardiovascular:     Rate and Rhythm: Normal rate.     Heart sounds: Murmur heard.  Pulmonary:     Effort: Pulmonary effort is normal. No respiratory distress.  Musculoskeletal:     Cervical back: Normal range of motion.  Skin:    General: Skin is warm and dry.  Neurological:     Mental Status: He is alert.      Diagnostic Studies & Laboratory data:    Left Heart Catherization: 04/03/2021 Occluded saphenous vein graft to obtuse  marginal.   Obtuse marginal, relatively small with focal 95% stenosis.  Decided against PCI given relatively small size of obtuse marginal and requirement for antithrombotic and antiplatelet therapy increasing the risk of bleeding.  Will enhance anti-ischemic therapy and if symptoms refractory, return later for PCI of OM.   Patent sequential vein graft to PDA and PL   Widely patent  left main   Totally occluded proximal to mid LAD   Circumflex as noted above the small and the small to moderate-sized obtuse marginal contains 95% stenosis.  At the time of grafting there was less than 50% stenosis in the region where the stenosis is currently located.   Total occlusion of proximal to mid RCA   Mild pulmonary hypertension with mean PA pressure 30 mmHg; V wave 34 mmHg.   Cardiac output 4.9 L/min   Pulmonary vascular resistance 2.7 Woods units Echo: 02/26/2021 transesophageal echocardiogram IMPRESSIONS     1. Left ventricular ejection fraction, by estimation, is 60 to 65%. The  left ventricle has normal function.   2. Right ventricular systolic function is normal. The right ventricular  size is normal.   3. No left atrial/left atrial appendage thrombus was detected.   4. There was no reversal of flow in the pulmonary arteries.      . The mitral valve is grossly normal. Moderate to severe mitral valve  regurgitation. There is mild holosystolic prolapse of both leaflets of the  mitral valve.   5. Tricuspid valve regurgitation is mild to moderate.   6. The aortic valve is tricuspid. Aortic valve regurgitation is not  visualized.   EKG: Atrial fibrillation I have independently reviewed the above radiologic studies and discussed with the patient   Recent Lab Findings: Lab Results  Component Value Date   WBC 5.9 03/05/2021   HGB 12.2 (L) 04/03/2021   HCT 36.0 (L) 04/03/2021   PLT 175 03/05/2021   GLUCOSE 150 (H) 04/03/2021   CHOL 130 02/25/2021   TRIG 102 02/25/2021   HDL 35 (L) 02/25/2021    LDLCALC 75 02/25/2021   ALT 20 10/10/2019   AST 19 10/10/2019   NA 141 04/03/2021   K 4.0 04/03/2021   CL 104 04/03/2021   CREATININE 0.80 04/03/2021   BUN 15 04/03/2021   CO2 28 03/05/2021   TSH 1.897 02/25/2021   INR 1.4 (H) 02/25/2021   HGBA1C 6.4 (H) 02/25/2021      Assessment / Plan:   80 yo male with hx of CABG, now has severe mitral valve regurgitation.  Although he remains active and is in good shape for his age he is extremely high risk for redo sternotomy and mitral valve repair.  I think that he would be better served with MitraClip placement.  I will speak with Dr.Cooper to confirm this.    I  spent 40 minutes counseling the patient face to face.   Corliss Skains 04/26/2021 5:54 PM

## 2021-04-25 ENCOUNTER — Encounter: Payer: Self-pay | Admitting: Physician Assistant

## 2021-04-26 ENCOUNTER — Other Ambulatory Visit: Payer: Self-pay

## 2021-04-26 ENCOUNTER — Encounter: Payer: Self-pay | Admitting: Thoracic Surgery (Cardiothoracic Vascular Surgery)

## 2021-04-26 ENCOUNTER — Institutional Professional Consult (permissible substitution) (INDEPENDENT_AMBULATORY_CARE_PROVIDER_SITE_OTHER): Payer: Medicare Other | Admitting: Thoracic Surgery (Cardiothoracic Vascular Surgery)

## 2021-04-26 VITALS — BP 147/72 | HR 68 | Resp 18 | Ht 68.0 in | Wt 141.0 lb

## 2021-04-26 DIAGNOSIS — I340 Nonrheumatic mitral (valve) insufficiency: Secondary | ICD-10-CM

## 2021-04-30 ENCOUNTER — Other Ambulatory Visit: Payer: Self-pay

## 2021-04-30 ENCOUNTER — Encounter (INDEPENDENT_AMBULATORY_CARE_PROVIDER_SITE_OTHER): Payer: Medicare Other | Admitting: Ophthalmology

## 2021-04-30 DIAGNOSIS — H35033 Hypertensive retinopathy, bilateral: Secondary | ICD-10-CM

## 2021-04-30 DIAGNOSIS — H353231 Exudative age-related macular degeneration, bilateral, with active choroidal neovascularization: Secondary | ICD-10-CM

## 2021-04-30 DIAGNOSIS — H43813 Vitreous degeneration, bilateral: Secondary | ICD-10-CM | POA: Diagnosis not present

## 2021-04-30 DIAGNOSIS — I10 Essential (primary) hypertension: Secondary | ICD-10-CM | POA: Diagnosis not present

## 2021-05-01 ENCOUNTER — Encounter (INDEPENDENT_AMBULATORY_CARE_PROVIDER_SITE_OTHER): Payer: Medicare Other | Admitting: Ophthalmology

## 2021-05-01 DIAGNOSIS — H353211 Exudative age-related macular degeneration, right eye, with active choroidal neovascularization: Secondary | ICD-10-CM

## 2021-05-03 NOTE — Progress Notes (Signed)
Surgical Instructions    Your procedure is scheduled on 05/09/21.  Report to [facility] Main Entrance "A" at 08:45 A.M., then check in with the Admitting office.  Call this number if you have problems the morning of surgery:  [phone]   If you have any questions prior to your surgery date call [phone]: Open Monday-Friday 8am-4pm    Remember:  Do not eat or drink after midnight the night before your surgery      Take these medicines the morning of surgery with A SIP OF WATER  dorzolamide-timolol (COSOPT) eye drops isosorbide mononitrate (IMDUR) metoprolol succinate (TOPROL-XL) Propylene Glycol (SYSTANE BALANCE) if needed  NOTE FROM YOUR SURGEON: Take last dose of Eliquis 05/06/2021 night and then STOP until directed to restart. Hold Hyzaar the morning of procedure. Hold Metformin the morning of procedure and 48 hours afterward. Otherwise, take medications as directed with a sip of water.  As of today, STOP taking any Aspirin (unless otherwise instructed by your surgeon) Aleve, Naproxen, Ibuprofen, Motrin, Advil, Goody's, BC's, all herbal medications, fish oil, and all vitamins.  WHAT DO I DO ABOUT MY DIABETES MEDICATION?   Do not take oral diabetes medicines (pills) the morning of surgery.  The day of surgery, do not take other diabetes injectables, including Byetta (exenatide), Bydureon (exenatide ER), Victoza (liraglutide), or Trulicity (dulaglutide).  If your CBG is greater than 220 mg/dL, you may take  of your sliding scale (correction) dose of insulin.   HOW TO MANAGE YOUR DIABETES BEFORE AND AFTER SURGERY  Why is it important to control my blood sugar before and after surgery? Improving blood sugar levels before and after surgery helps healing and can limit problems. A way of improving blood sugar control is eating a healthy diet by:  Eating less sugar and carbohydrates  Increasing activity/exercise  Talking with your doctor about reaching your blood sugar  goals High blood sugars (greater than 180 mg/dL) can raise your risk of infections and slow your recovery, so you will need to focus on controlling your diabetes during the weeks before surgery. Make sure that the doctor who takes care of your diabetes knows about your planned surgery including the date and location.  How do I manage my blood sugar before surgery? Check your blood sugar at least 4 times a day, starting 2 days before surgery, to make sure that the level is not too high or low.  Check your blood sugar the morning of your surgery when you wake up and every 2 hours until you get to the Short Stay unit.  If your blood sugar is less than 70 mg/dL, you will need to treat for low blood sugar: Do not take insulin. Treat a low blood sugar (less than 70 mg/dL) with  cup of clear juice (cranberry or apple), 4 glucose tablets, OR glucose gel. Recheck blood sugar in 15 minutes after treatment (to make sure it is greater than 70 mg/dL). If your blood sugar is not greater than 70 mg/dL on recheck, call [phone] for further instructions. Report your blood sugar to the short stay nurse when you get to Short Stay.  If you are admitted to the hospital after surgery: Your blood sugar will be checked by the staff and you will probably be given insulin after surgery (instead of oral diabetes medicines) to make sure you have good blood sugar levels. The goal for blood sugar control after surgery is 80-180 mg/dL.      After your COVID test   You  are not required to quarantine however you are required to wear a well-fitting mask when you are out and around people not in your household.  If your mask becomes wet or soiled, replace with a new one.  Wash your hands often with soap and water for 20 seconds or clean your hands with an alcohol-based hand sanitizer that contains at least 60% alcohol.  Do not share personal items.  Notify your provider: if you are in close contact with someone  who has COVID  or if you develop a fever of 100.4 or greater, sneezing, cough, sore throat, shortness of breath or body aches.             Do not wear jewelry or makeup Do not wear lotions, powders, perfumes/colognes, or deodorant. Men may shave face and neck. Do not bring valuables to the hospital. DO Not wear nail polish, gel polish, artificial nails, or any other type of covering on natural nails including finger and toenails. If patients have artificial nails, gel coating, etc. that need to be removed by a nail salon, please have this removed prior to surgery or surgery may need to be canceled/delayed if the surgeon/ anesthesia feels like the patient is unable to be adequately monitored.             [facility] is not responsible for any belongings or valuables.  Do NOT Smoke (Tobacco/Vaping)  24 hours prior to your procedure  If you use a CPAP at night, you may bring your mask for your overnight stay.   Contacts, glasses, hearing aids, dentures or partials may not be worn into surgery, please bring cases for these belongings   For patients admitted to the hospital, discharge time will be determined by your treatment team.   Patients discharged the day of surgery will not be allowed to drive home, and someone needs to stay with them for 24 hours.  NO VISITORS WILL BE ALLOWED IN PRE-OP WHERE PATIENTS ARE PREPPED FOR SURGERY.  ONLY 1 SUPPORT PERSON MAY BE PRESENT IN THE WAITING ROOM WHILE YOU ARE IN SURGERY.  IF YOU ARE TO BE ADMITTED, ONCE YOU ARE IN YOUR ROOM YOU WILL BE ALLOWED TWO (2) VISITORS. 1 (ONE) VISITOR MAY STAY OVERNIGHT BUT MUST ARRIVE TO THE ROOM BY 8pm.  Minor children may have two parents present. Special consideration for safety and communication needs will be reviewed on a case by case basis.  Special instructions:    Oral Hygiene is also important to reduce your risk of infection.  Remember - BRUSH YOUR TEETH THE MORNING OF SURGERY WITH YOUR REGULAR  TOOTHPASTE   [facility]- Preparing For Surgery  Before surgery, you can play an important role. Because skin is not sterile, your skin needs to be as free of germs as possible. You can reduce the number of germs on your skin by washing with CHG (chlorahexidine gluconate) Soap before surgery.  CHG is an antiseptic cleaner which kills germs and bonds with the skin to continue killing germs even after washing.     Please do not use if you have an allergy to CHG or antibacterial soaps. If your skin becomes reddened/irritated stop using the CHG.  Do not shave (including legs and underarms) for at least 48 hours prior to first CHG shower. It is OK to shave your face.  Please follow these instructions carefully.     Shower the NIGHT BEFORE SURGERY and the MORNING OF SURGERY with CHG Soap.   If you  chose to wash your hair, wash your hair first as usual with your normal shampoo. After you shampoo, rinse your hair and body thoroughly to remove the shampoo.  Then [employer] and genitals (private parts) with your normal soap and rinse thoroughly to remove soap.  After that Use CHG Soap as you would any other liquid soap. You can apply CHG directly to the skin and wash gently with a scrungie or a clean washcloth.   Apply the CHG Soap to your body ONLY FROM THE NECK DOWN.  Do not use on open wounds or open sores. Avoid contact with your eyes, ears, mouth and genitals (private parts). Wash Face and genitals (private parts)  with your normal soap.   Wash thoroughly, paying special attention to the area where your surgery will be performed.  Thoroughly rinse your body with warm water from the neck down.  DO NOT shower/wash with your normal soap after using and rinsing off the CHG Soap.  Pat yourself dry with a CLEAN TOWEL.  Wear CLEAN PAJAMAS to bed the night before surgery  Place CLEAN SHEETS on your bed the night before your surgery  DO NOT SLEEP WITH PETS.   Day of Surgery: Take a shower with  CHG soap. Wear Clean/Comfortable clothing the morning of surgery Do not apply any deodorants/lotions.   Remember to brush your teeth WITH YOUR REGULAR TOOTHPASTE.   Please read over the following fact sheets that you were given.

## 2021-05-06 ENCOUNTER — Encounter (HOSPITAL_COMMUNITY): Payer: Self-pay

## 2021-05-06 ENCOUNTER — Encounter (HOSPITAL_COMMUNITY)
Admission: RE | Admit: 2021-05-06 | Discharge: 2021-05-06 | Disposition: A | Discharge status: Home / Self Care | Payer: Medicare Other | Source: Ambulatory Visit | Attending: Cardiovascular Disease | Admitting: Cardiovascular Disease

## 2021-05-06 ENCOUNTER — Ambulatory Visit (HOSPITAL_COMMUNITY)
Admission: RE | Admit: 2021-05-06 | Discharge: 2021-05-06 | Disposition: A | Discharge status: Home / Self Care | Payer: Medicare Other | Source: Ambulatory Visit | Attending: Cardiovascular Disease | Admitting: Cardiovascular Disease

## 2021-05-06 ENCOUNTER — Other Ambulatory Visit: Payer: Self-pay

## 2021-05-06 DIAGNOSIS — Z006 Encounter for examination for normal comparison and control in clinical research program: Secondary | ICD-10-CM | POA: Diagnosis not present

## 2021-05-06 DIAGNOSIS — I4819 Other persistent atrial fibrillation: Secondary | ICD-10-CM | POA: Diagnosis not present

## 2021-05-06 DIAGNOSIS — E119 Type 2 diabetes mellitus without complications: Secondary | ICD-10-CM | POA: Diagnosis not present

## 2021-05-06 DIAGNOSIS — Z951 Presence of aortocoronary bypass graft: Secondary | ICD-10-CM | POA: Insufficient documentation

## 2021-05-06 DIAGNOSIS — I10 Essential (primary) hypertension: Secondary | ICD-10-CM | POA: Diagnosis not present

## 2021-05-06 DIAGNOSIS — Z20822 Contact with and (suspected) exposure to covid-19: Secondary | ICD-10-CM | POA: Insufficient documentation

## 2021-05-06 DIAGNOSIS — I2510 Atherosclerotic heart disease of native coronary artery without angina pectoris: Secondary | ICD-10-CM | POA: Diagnosis not present

## 2021-05-06 DIAGNOSIS — I340 Nonrheumatic mitral (valve) insufficiency: Secondary | ICD-10-CM

## 2021-05-06 DIAGNOSIS — I272 Pulmonary hypertension, unspecified: Secondary | ICD-10-CM | POA: Diagnosis not present

## 2021-05-06 DIAGNOSIS — Z8249 Family history of ischemic heart disease and other diseases of the circulatory system: Secondary | ICD-10-CM | POA: Diagnosis not present

## 2021-05-06 DIAGNOSIS — E7800 Pure hypercholesterolemia, unspecified: Secondary | ICD-10-CM | POA: Diagnosis not present

## 2021-05-06 DIAGNOSIS — I700 Atherosclerosis of aorta: Secondary | ICD-10-CM | POA: Insufficient documentation

## 2021-05-06 DIAGNOSIS — Z01818 Encounter for other preprocedural examination: Secondary | ICD-10-CM | POA: Insufficient documentation

## 2021-05-06 DIAGNOSIS — Z87891 Personal history of nicotine dependence: Secondary | ICD-10-CM | POA: Diagnosis not present

## 2021-05-06 LAB — TYPE AND SCREEN
ABO/RH(D): AB POS
Antibody Screen: NEGATIVE

## 2021-05-06 LAB — COMPREHENSIVE METABOLIC PANEL
ALT: 20 U/L (ref 0–44)
AST: 21 U/L (ref 15–41)
Albumin: 3.7 g/dL (ref 3.5–5.0)
Alkaline Phosphatase: 52 U/L (ref 38–126)
Anion gap: 10 (ref 5–15)
BUN: 18 mg/dL (ref 8–23)
CO2: 20 mmol/L — ABNORMAL LOW (ref 22–32)
Calcium: 9.1 mg/dL (ref 8.9–10.3)
Chloride: 107 mmol/L (ref 98–111)
Creatinine, Ser: 0.94 mg/dL (ref 0.61–1.24)
GFR, Estimated: >60 mL/min (ref >60)
Glucose, Bld: 149 mg/dL — ABNORMAL HIGH (ref 70–99)
Potassium: 4.4 mmol/L (ref 3.5–5.1)
Sodium: 137 mmol/L (ref 135–145)
Total Bilirubin: 2.5 mg/dL — ABNORMAL HIGH (ref 0.3–1.2)
Total Protein: 6.6 g/dL (ref 6.5–8.1)

## 2021-05-06 LAB — URINALYSIS, ROUTINE W REFLEX MICROSCOPIC
Bacteria, UA: NONE SEEN
Bilirubin Urine: NEGATIVE
Glucose, UA: NEGATIVE mg/dL
Hgb urine dipstick: NEGATIVE
Ketones, ur: NEGATIVE mg/dL
Leukocytes,Ua: NEGATIVE
Nitrite: NEGATIVE
Protein, ur: 100 mg/dL — AB
Specific Gravity, Urine: 1.018 (ref 1.005–1.030)
pH: 5 (ref 5.0–8.0)

## 2021-05-06 LAB — BLOOD GAS, ARTERIAL
Acid-base deficit: 2.5 mmol/L — ABNORMAL HIGH (ref 0.0–2.0)
Bicarbonate: 21.8 mmol/L (ref 20.0–28.0)
FIO2: 21
O2 Saturation: 97.3 %
Patient temperature: 37
pCO2 arterial: 37.2 mmHg (ref 32.0–48.0)
pH, Arterial: 7.385 (ref 7.350–7.450)
pO2, Arterial: 97.4 mmHg (ref 83.0–108.0)

## 2021-05-06 LAB — CBC
HCT: 40.3 % (ref 39.0–52.0)
Hemoglobin: 12.6 g/dL — ABNORMAL LOW (ref 13.0–17.0)
MCH: 32.2 pg (ref 26.0–34.0)
MCHC: 31.3 g/dL (ref 30.0–36.0)
MCV: 103.1 fL — ABNORMAL HIGH (ref 80.0–100.0)
Platelets: 173 10*3/uL (ref 150–400)
RBC: 3.91 MIL/uL — ABNORMAL LOW (ref 4.22–5.81)
RDW: 15.7 % — ABNORMAL HIGH (ref 11.5–15.5)
WBC: 6.1 10*3/uL (ref 4.0–10.5)
nRBC: 0 % (ref 0.0–0.2)

## 2021-05-06 LAB — SARS CORONAVIRUS 2 (TAT 6-24 HRS): SARS Coronavirus 2: NEGATIVE

## 2021-05-06 LAB — HEMOGLOBIN A1C
Hgb A1c MFr Bld: 6.8 % — ABNORMAL HIGH (ref 4.8–5.6)
Mean Plasma Glucose: 148.46 mg/dL

## 2021-05-06 LAB — SURGICAL PCR SCREEN
MRSA, PCR: NEGATIVE
Staphylococcus aureus: POSITIVE — AB

## 2021-05-06 LAB — PROTIME-INR
INR: 1.5 — ABNORMAL HIGH (ref 0.8–1.2)
Prothrombin Time: 17.8 seconds — ABNORMAL HIGH (ref 11.4–15.2)

## 2021-05-06 LAB — GLUCOSE, CAPILLARY: Glucose-Capillary: 131 mg/dL — ABNORMAL HIGH (ref 70–99)

## 2021-05-06 NOTE — Progress Notes (Signed)
PCP - Lorenda Ishihara Cardiologist - Dr. Verdis Prime  Chest x-ray - 05/06/21 EKG - 05/06/21 Stress Test - Yes 3 weeks ago ECHO - 02/26/21 Cardiac Cath -04/03/21   Denies OSA  DM - Type II CBG at PAT appt 131 had a piece of toast with jelly and 1/2 cup OJ Fasting Blood Sugar - Doesn't  check blood sugar regularly Usually averages 118-131  Blood Thinner Instructions:Per instructions last Eliquis dose on 05/06/21. Hold Hyzaar am of procedure. Aspirin Instructions: Has been instructed to stop in the past but will double check with Dr. Excell Seltzer  COVID TEST- 05/06/21   Anesthesia review: Yes cardiac history  Patient denies shortness of breath, fever, cough and chest pain at PAT appointment   All instructions explained to the patient, with a verbal understanding of the material. Patient agrees to go over the instructions while at home for a better understanding. Patient also instructed to wear a mask while in public after being tested for COVID-19. The opportunity to ask questions was provided.

## 2021-05-07 ENCOUNTER — Other Ambulatory Visit (HOSPITAL_COMMUNITY): Payer: Medicare Other

## 2021-05-08 MED ORDER — TRANEXAMIC ACID 1000 MG/10ML IV SOLN
1.5000 mg/kg/h | INTRAVENOUS | Status: DC
  Filled 2021-05-08: qty 25

## 2021-05-08 MED ORDER — MAGNESIUM SULFATE 50 % IJ SOLN
40.0000 meq | INTRAMUSCULAR | Status: DC
  Filled 2021-05-08: qty 9.85

## 2021-05-08 MED ORDER — MILRINONE LACTATE IN DEXTROSE 20-5 MG/100ML-% IV SOLN
0.3000 ug/kg/min | INTRAVENOUS | Status: DC
  Filled 2021-05-08 (×2): qty 100

## 2021-05-08 MED ORDER — VANCOMYCIN HCL 1250 MG/250ML IV SOLN
1250.0000 mg | INTRAVENOUS | Status: DC
  Filled 2021-05-08 (×2): qty 250

## 2021-05-08 MED ORDER — NOREPINEPHRINE 4 MG/250ML-% IV SOLN
0.0000 ug/min | INTRAVENOUS | Status: DC
  Filled 2021-05-08: qty 250

## 2021-05-08 MED ORDER — TRANEXAMIC ACID (OHS) PUMP PRIME SOLUTION
2.0000 mg/kg | INTRAVENOUS | Status: DC
  Filled 2021-05-08: qty 1.36

## 2021-05-08 MED ORDER — PHENYLEPHRINE HCL-NACL 20-0.9 MG/250ML-% IV SOLN
30.0000 ug/min | INTRAVENOUS | Status: DC
  Filled 2021-05-08 (×2): qty 250

## 2021-05-08 MED ORDER — EPINEPHRINE HCL 5 MG/250ML IV SOLN IN NS
0.0000 ug/min | INTRAVENOUS | Status: DC
Start: 2021-05-09 — End: 2021-05-09
  Filled 2021-05-08 (×2): qty 250

## 2021-05-08 MED ORDER — INSULIN REGULAR(HUMAN) IN NACL 100-0.9 UT/100ML-% IV SOLN
INTRAVENOUS | Status: DC
  Filled 2021-05-08 (×2): qty 100

## 2021-05-08 MED ORDER — DEXMEDETOMIDINE HCL IN NACL 400 MCG/100ML IV SOLN
0.1000 ug/kg/h | INTRAVENOUS | Status: DC
  Filled 2021-05-08 (×2): qty 100

## 2021-05-08 MED ORDER — HEPARIN 30,000 UNITS/1000 ML (OHS) CELLSAVER SOLUTION
Status: DC
  Filled 2021-05-08: qty 1000

## 2021-05-08 MED ORDER — NITROGLYCERIN IN D5W 200-5 MCG/ML-% IV SOLN
2.0000 ug/min | INTRAVENOUS | Status: DC
  Filled 2021-05-08: qty 250

## 2021-05-08 MED ORDER — CEFAZOLIN SODIUM-DEXTROSE 2-4 GM/100ML-% IV SOLN
2.0000 g | INTRAVENOUS | Status: AC
  Administered 2021-05-09: 2 g via INTRAVENOUS
  Filled 2021-05-08 (×3): qty 100

## 2021-05-08 MED ORDER — CEFAZOLIN SODIUM-DEXTROSE 2-4 GM/100ML-% IV SOLN
2.0000 g | INTRAVENOUS | Status: DC
  Filled 2021-05-08 (×2): qty 100

## 2021-05-08 MED ORDER — POTASSIUM CHLORIDE 2 MEQ/ML IV SOLN
80.0000 meq | INTRAVENOUS | Status: DC
  Filled 2021-05-08: qty 40

## 2021-05-08 MED ORDER — PLASMA-LYTE A IV SOLN
INTRAVENOUS | Status: DC
  Filled 2021-05-08: qty 5

## 2021-05-08 MED ORDER — TRANEXAMIC ACID (OHS) BOLUS VIA INFUSION: 15.0000 mg/kg | INTRAVENOUS | Status: DC

## 2021-05-09 ENCOUNTER — Ambulatory Visit (HOSPITAL_COMMUNITY)
Admission: RE | Admit: 2021-05-09 | Discharge: 2021-05-09 | Disposition: A | Discharge status: Home / Self Care | Payer: Medicare Other | Source: Ambulatory Visit | Attending: Cardiovascular Disease | Admitting: Cardiovascular Disease

## 2021-05-09 ENCOUNTER — Encounter (HOSPITAL_COMMUNITY)
Admission: RE | Disposition: A | Discharge status: Home / Self Care | Payer: Self-pay | Source: Home / Self Care | Attending: Cardiovascular Disease

## 2021-05-09 ENCOUNTER — Inpatient Hospital Stay (HOSPITAL_COMMUNITY)
Admission: RE | Admit: 2021-05-09 | Discharge: 2021-05-10 | DRG: 267 | Disposition: A | Discharge status: Home / Self Care | Payer: Medicare Other | Attending: Internal Medicine | Admitting: Internal Medicine

## 2021-05-09 ENCOUNTER — Inpatient Hospital Stay (HOSPITAL_COMMUNITY): Payer: Medicare Other | Admitting: Certified Registered"

## 2021-05-09 ENCOUNTER — Inpatient Hospital Stay (HOSPITAL_COMMUNITY): Payer: Medicare Other | Admitting: Emergency Medicine

## 2021-05-09 ENCOUNTER — Other Ambulatory Visit: Payer: Self-pay

## 2021-05-09 ENCOUNTER — Inpatient Hospital Stay (HOSPITAL_COMMUNITY): Payer: Medicare Other

## 2021-05-09 ENCOUNTER — Encounter (HOSPITAL_COMMUNITY): Payer: Self-pay | Admitting: Cardiovascular Disease

## 2021-05-09 DIAGNOSIS — Z8249 Family history of ischemic heart disease and other diseases of the circulatory system: Secondary | ICD-10-CM | POA: Diagnosis not present

## 2021-05-09 DIAGNOSIS — I088 Other rheumatic multiple valve diseases: Secondary | ICD-10-CM | POA: Diagnosis not present

## 2021-05-09 DIAGNOSIS — E119 Type 2 diabetes mellitus without complications: Secondary | ICD-10-CM

## 2021-05-09 DIAGNOSIS — I10 Essential (primary) hypertension: Secondary | ICD-10-CM | POA: Diagnosis present

## 2021-05-09 DIAGNOSIS — Z006 Encounter for examination for normal comparison and control in clinical research program: Secondary | ICD-10-CM

## 2021-05-09 DIAGNOSIS — E785 Hyperlipidemia, unspecified: Secondary | ICD-10-CM | POA: Diagnosis not present

## 2021-05-09 DIAGNOSIS — Z7901 Long term (current) use of anticoagulants: Secondary | ICD-10-CM | POA: Diagnosis not present

## 2021-05-09 DIAGNOSIS — Z7984 Long term (current) use of oral hypoglycemic drugs: Secondary | ICD-10-CM | POA: Diagnosis not present

## 2021-05-09 DIAGNOSIS — I340 Nonrheumatic mitral (valve) insufficiency: Principal | ICD-10-CM | POA: Diagnosis present

## 2021-05-09 DIAGNOSIS — I2510 Atherosclerotic heart disease of native coronary artery without angina pectoris: Secondary | ICD-10-CM | POA: Diagnosis not present

## 2021-05-09 DIAGNOSIS — R001 Bradycardia, unspecified: Secondary | ICD-10-CM | POA: Diagnosis not present

## 2021-05-09 DIAGNOSIS — Z951 Presence of aortocoronary bypass graft: Secondary | ICD-10-CM

## 2021-05-09 DIAGNOSIS — Z20822 Contact with and (suspected) exposure to covid-19: Secondary | ICD-10-CM | POA: Diagnosis not present

## 2021-05-09 DIAGNOSIS — I110 Hypertensive heart disease with heart failure: Secondary | ICD-10-CM | POA: Diagnosis not present

## 2021-05-09 DIAGNOSIS — Z79899 Other long term (current) drug therapy: Secondary | ICD-10-CM | POA: Diagnosis not present

## 2021-05-09 DIAGNOSIS — E7800 Pure hypercholesterolemia, unspecified: Secondary | ICD-10-CM | POA: Diagnosis present

## 2021-05-09 DIAGNOSIS — I4819 Other persistent atrial fibrillation: Secondary | ICD-10-CM | POA: Diagnosis present

## 2021-05-09 DIAGNOSIS — I509 Heart failure, unspecified: Secondary | ICD-10-CM | POA: Diagnosis not present

## 2021-05-09 DIAGNOSIS — Z95818 Presence of other cardiac implants and grafts: Secondary | ICD-10-CM

## 2021-05-09 DIAGNOSIS — Z9889 Other specified postprocedural states: Secondary | ICD-10-CM

## 2021-05-09 DIAGNOSIS — Z952 Presence of prosthetic heart valve: Secondary | ICD-10-CM

## 2021-05-09 DIAGNOSIS — I272 Pulmonary hypertension, unspecified: Secondary | ICD-10-CM | POA: Diagnosis not present

## 2021-05-09 DIAGNOSIS — Z01818 Encounter for other preprocedural examination: Secondary | ICD-10-CM

## 2021-05-09 DIAGNOSIS — I4891 Unspecified atrial fibrillation: Secondary | ICD-10-CM

## 2021-05-09 DIAGNOSIS — Z87891 Personal history of nicotine dependence: Secondary | ICD-10-CM | POA: Diagnosis not present

## 2021-05-09 HISTORY — PX: TEE WITHOUT CARDIOVERSION: SHX5443

## 2021-05-09 HISTORY — DX: Presence of other cardiac implants and grafts: Z95.818

## 2021-05-09 HISTORY — PX: MITRAL VALVE REPAIR: CATH118311

## 2021-05-09 HISTORY — DX: Other specified postprocedural states: Z98.890

## 2021-05-09 LAB — GLUCOSE, CAPILLARY
Glucose-Capillary: 127 mg/dL — ABNORMAL HIGH (ref 70–99)
Glucose-Capillary: 145 mg/dL — ABNORMAL HIGH (ref 70–99)

## 2021-05-09 LAB — ECHO TEE
MV M vel: 5.48 m/s
MV Peak grad: 120.1 mmHg
Radius: 0.6 cm

## 2021-05-09 LAB — POCT ACTIVATED CLOTTING TIME: Activated Clotting Time: 271 seconds

## 2021-05-09 SURGERY — MITRAL VALVE REPAIR
Anesthesia: General

## 2021-05-09 MED ORDER — CHLORHEXIDINE GLUCONATE 4 % EX LIQD: 30.0000 mL | CUTANEOUS | Status: DC

## 2021-05-09 MED ORDER — ROCURONIUM BROMIDE 10 MG/ML (PF) SYRINGE
PREFILLED_SYRINGE | INTRAVENOUS | Status: DC | PRN
  Administered 2021-05-09: 60 mg via INTRAVENOUS

## 2021-05-09 MED ORDER — ONDANSETRON HCL 4 MG/2ML IJ SOLN
4.0000 mg | Freq: Four times a day (QID) | INTRAMUSCULAR | Status: DC | PRN
Start: 2021-05-09 — End: 2021-05-10

## 2021-05-09 MED ORDER — HEPARIN (PORCINE) IN NACL 1000-0.9 UT/500ML-% IV SOLN
INTRAVENOUS | Status: DC | PRN
  Administered 2021-05-09: 500 mL

## 2021-05-09 MED ORDER — HEPARIN (PORCINE) IN NACL 2000-0.9 UNIT/L-% IV SOLN
INTRAVENOUS | Status: AC
  Filled 2021-05-09: qty 3000

## 2021-05-09 MED ORDER — LABETALOL HCL 5 MG/ML IV SOLN: 10.0000 mg | INTRAVENOUS | Status: DC | PRN

## 2021-05-09 MED ORDER — CHLORHEXIDINE GLUCONATE 0.12 % MT SOLN: 15.0000 mL | Freq: Once | OROMUCOSAL | Status: DC

## 2021-05-09 MED ORDER — HEPARIN (PORCINE) IN NACL 2000-0.9 UNIT/L-% IV SOLN
INTRAVENOUS | Status: DC | PRN
  Administered 2021-05-09 (×3): 1000 mL

## 2021-05-09 MED ORDER — ACETAMINOPHEN 160 MG/5ML PO SOLN: 1000.0000 mg | Freq: Once | ORAL | Status: DC | PRN

## 2021-05-09 MED ORDER — FENTANYL CITRATE (PF) 100 MCG/2ML IJ SOLN: 25.0000 ug | INTRAMUSCULAR | Status: DC | PRN

## 2021-05-09 MED ORDER — HEPARIN (PORCINE) IN NACL 1000-0.9 UT/500ML-% IV SOLN
INTRAVENOUS | Status: AC
  Filled 2021-05-09: qty 500

## 2021-05-09 MED ORDER — LIDOCAINE 2% (20 MG/ML) 5 ML SYRINGE
INTRAMUSCULAR | Status: DC | PRN
  Administered 2021-05-09: 40 mg via INTRAVENOUS

## 2021-05-09 MED ORDER — METFORMIN HCL 500 MG PO TABS
1000.0000 mg | ORAL_TABLET | Freq: Every day | ORAL | Status: DC
  Administered 2021-05-09: 1000 mg via ORAL
  Filled 2021-05-09: qty 2

## 2021-05-09 MED ORDER — SODIUM CHLORIDE 0.9% FLUSH: 3.0000 mL | INTRAVENOUS | Status: DC | PRN

## 2021-05-09 MED ORDER — HEPARIN SODIUM (PORCINE) 1000 UNIT/ML IJ SOLN
INTRAMUSCULAR | Status: DC | PRN
  Administered 2021-05-09: 10000 [IU] via INTRAVENOUS

## 2021-05-09 MED ORDER — ISOSORBIDE MONONITRATE ER 30 MG PO TB24
30.0000 mg | ORAL_TABLET | Freq: Every day | ORAL | Status: DC
  Administered 2021-05-09 – 2021-05-10 (×2): 30 mg via ORAL
  Filled 2021-05-09 (×2): qty 1

## 2021-05-09 MED ORDER — SUGAMMADEX SODIUM 200 MG/2ML IV SOLN
INTRAVENOUS | Status: DC | PRN
  Administered 2021-05-09: 140 mg via INTRAVENOUS

## 2021-05-09 MED ORDER — HYDRALAZINE HCL 20 MG/ML IJ SOLN
INTRAMUSCULAR | Status: DC | PRN
  Administered 2021-05-09 (×4): 5 mg via INTRAVENOUS

## 2021-05-09 MED ORDER — CHLORHEXIDINE GLUCONATE 4 % EX LIQD: 60.0000 mL | Freq: Once | CUTANEOUS | Status: DC

## 2021-05-09 MED ORDER — SODIUM CHLORIDE 0.9% FLUSH
3.0000 mL | Freq: Two times a day (BID) | INTRAVENOUS | Status: DC
  Administered 2021-05-09 – 2021-05-10 (×2): 3 mL via INTRAVENOUS

## 2021-05-09 MED ORDER — HYDRALAZINE HCL 20 MG/ML IJ SOLN: 5.0000 mg | INTRAMUSCULAR | Status: DC | PRN

## 2021-05-09 MED ORDER — APIXABAN 5 MG PO TABS: 5.0000 mg | ORAL_TABLET | Freq: Two times a day (BID) | ORAL | Status: DC

## 2021-05-09 MED ORDER — ACETAMINOPHEN 325 MG PO TABS
650.0000 mg | ORAL_TABLET | ORAL | Status: DC | PRN
  Administered 2021-05-10: 650 mg via ORAL
  Filled 2021-05-09: qty 2

## 2021-05-09 MED ORDER — ORAL CARE MOUTH RINSE: 15.0000 mL | Freq: Once | OROMUCOSAL | Status: AC

## 2021-05-09 MED ORDER — METOPROLOL SUCCINATE ER 50 MG PO TB24: 50.0000 mg | ORAL_TABLET | Freq: Every day | ORAL | Status: DC

## 2021-05-09 MED ORDER — PHENYLEPHRINE HCL-NACL 20-0.9 MG/250ML-% IV SOLN
INTRAVENOUS | Status: DC | PRN
  Administered 2021-05-09: 25 ug/min via INTRAVENOUS

## 2021-05-09 MED ORDER — PRAVASTATIN SODIUM 40 MG PO TABS
20.0000 mg | ORAL_TABLET | Freq: Every day | ORAL | Status: DC
  Administered 2021-05-09: 20 mg via ORAL
  Filled 2021-05-09: qty 1

## 2021-05-09 MED ORDER — APIXABAN 5 MG PO TABS
5.0000 mg | ORAL_TABLET | Freq: Two times a day (BID) | ORAL | Status: DC
  Administered 2021-05-09 – 2021-05-10 (×2): 5 mg via ORAL
  Filled 2021-05-09 (×2): qty 1

## 2021-05-09 MED ORDER — LACTATED RINGERS IV SOLN: INTRAVENOUS | Status: DC

## 2021-05-09 MED ORDER — ONDANSETRON HCL 4 MG/2ML IJ SOLN
INTRAMUSCULAR | Status: DC | PRN
  Administered 2021-05-09: 4 mg via INTRAVENOUS

## 2021-05-09 MED ORDER — CEFAZOLIN SODIUM-DEXTROSE 2-4 GM/100ML-% IV SOLN
2.0000 g | INTRAVENOUS | Status: DC
Start: 2021-05-09 — End: 2021-05-09

## 2021-05-09 MED ORDER — ACETAMINOPHEN 500 MG PO TABS: 1000.0000 mg | ORAL_TABLET | Freq: Once | ORAL | Status: DC | PRN

## 2021-05-09 MED ORDER — LACTATED RINGERS IV SOLN: INTRAVENOUS | Status: DC | PRN

## 2021-05-09 MED ORDER — SODIUM CHLORIDE 0.9 % IV SOLN: 250.0000 mL | INTRAVENOUS | Status: DC | PRN

## 2021-05-09 MED ORDER — PROPOFOL 10 MG/ML IV BOLUS
INTRAVENOUS | Status: DC | PRN
  Administered 2021-05-09 (×2): 50 mg via INTRAVENOUS

## 2021-05-09 MED ORDER — INFLUENZA VAC A&B SA ADJ QUAD 0.5 ML IM PRSY
0.5000 mL | PREFILLED_SYRINGE | INTRAMUSCULAR | Status: DC
  Filled 2021-05-09: qty 0.5

## 2021-05-09 MED ORDER — PROTAMINE SULFATE 10 MG/ML IV SOLN
INTRAVENOUS | Status: DC | PRN
  Administered 2021-05-09 (×2): 15 mg via INTRAVENOUS

## 2021-05-09 MED ORDER — ACETAMINOPHEN 10 MG/ML IV SOLN: 1000.0000 mg | Freq: Once | INTRAVENOUS | Status: DC | PRN

## 2021-05-09 MED ORDER — SODIUM CHLORIDE 0.9 % IV SOLN: INTRAVENOUS | Status: DC

## 2021-05-09 MED ORDER — CHLORHEXIDINE GLUCONATE 0.12 % MT SOLN
15.0000 mL | Freq: Once | OROMUCOSAL | Status: AC
  Administered 2021-05-09: 15 mL via OROMUCOSAL
  Filled 2021-05-09 (×2): qty 15

## 2021-05-09 SURGICAL SUPPLY — 20 items
CATH MITRA STEERABLE GUIDE (CATHETERS) ×2 IMPLANT
CLIP MITRA G4 DELIVERY SYS XTW (Clip) ×2 IMPLANT
CLOSURE PERCLOSE PROSTYLE (VASCULAR PRODUCTS) ×4 IMPLANT
ELECT DEFIB PAD ADLT CADENCE (PAD) ×2 IMPLANT
GUIDEWIRE CVD PROTRACK .25 (WIRE) ×1 IMPLANT
GUIDWIRE CVD PROTRACK .25 (WIRE) ×2
KIT DILATOR VASC 18G NDL (KITS) ×2 IMPLANT
KIT HEART LEFT (KITS) ×4 IMPLANT
KIT MICROPUNCTURE NIT STIFF (SHEATH) ×2 IMPLANT
KIT VERSACROSS LRG ACCESS (CATHETERS) ×2 IMPLANT
PACK CARDIAC CATHETERIZATION (CUSTOM PROCEDURE TRAY) ×2 IMPLANT
SHEATH PERFORMER 16FR 30 (SHEATH) ×2 IMPLANT
SHEATH PINNACLE 8F 10CM (SHEATH) ×2 IMPLANT
SHEATH PROBE COVER 6X72 (BAG) ×4 IMPLANT
STOPCOCK MORSE 400PSI 3WAY (MISCELLANEOUS) ×12 IMPLANT
SYSTEM MITRACLIP G4 (SYSTAGENIX WOUND MANAGEMENT) ×2 IMPLANT
TRANSDUCER W/STOPCOCK (MISCELLANEOUS) ×2 IMPLANT
TUBING ART PRESS 72  MALE/FEM (TUBING) ×4
TUBING ART PRESS 72 MALE/FEM (TUBING) ×2 IMPLANT
TUBING CIL FLEX 10 FLL-RA (TUBING) ×2 IMPLANT

## 2021-05-09 NOTE — Progress Notes (Signed)
  Echocardiogram Echocardiogram Transesophageal has been performed.  Leta Jungling M 05/09/2021, 12:55 PM

## 2021-05-09 NOTE — Interval H&P Note (Signed)
History and Physical Interval Note:  05/09/2021 10:32 AM  Micheal [name]  has presented today for surgery, with the diagnosis of severe mitral insufficiency.  The various methods of treatment have been discussed with the patient and family. After consideration of risks, benefits and other options for treatment, the patient has consented to  Procedure(s): MITRAL VALVE REPAIR (N/A) TRANSESOPHAGEAL ECHOCARDIOGRAM (TEE) (N/A) as a surgical intervention.  The patient's history has been reviewed, patient examined, no change in status, stable for surgery.  I have reviewed the patient's chart and labs.  Questions were answered to the patient's satisfaction.    No changes reported since pt seen by Dr Cliffton Asters as part of a multidisciplinary evaluation. He reports NYHA 3 symptoms of exertional dyspnea and fatigue. All questions answered.   Tonny Bollman

## 2021-05-09 NOTE — Transfer of Care (Signed)
Immediate Anesthesia Transfer of Care Note  Patient: Micheal [name]  Procedure(s) Performed: MITRAL VALVE REPAIR TRANSESOPHAGEAL ECHOCARDIOGRAM (TEE)  Patient Location: PACU  Anesthesia Type:General  Level of Consciousness: drowsy and patient cooperative  Airway & Oxygen Therapy: Patient Spontanous Breathing and Patient connected to nasal cannula oxygen  Post-op Assessment: Report given to RN and Post -op Vital signs reviewed and stable  Post vital signs: Reviewed and stable  Last Vitals:  Vitals Value Taken Time  BP 112/38 05/09/21 1248  Temp 36.3 C 05/09/21 1242  Pulse 89 05/09/21 1251  Resp 18 05/09/21 1251  SpO2 91 % 05/09/21 1251  Vitals shown include unvalidated device data.  Last Pain:  Vitals:   05/09/21 1242  TempSrc:   PainSc: 0-No pain         Complications: No notable events documented.

## 2021-05-09 NOTE — Anesthesia Preprocedure Evaluation (Signed)
Anesthesia Evaluation  Patient identified by MRN, date of birth, ID band Patient awake    Reviewed: Allergy & Precautions, NPO status , Patient's Chart, lab work & pertinent test results  History of Anesthesia Complications Negative for: history of anesthetic complications  Airway Mallampati: III  TM Distance: >3 FB Neck ROM: Full    Dental  (+) Dental Advisory Given, Teeth Intact,    Pulmonary shortness of breath, neg sleep apnea, neg COPD, neg recent URI, former smoker,    breath sounds clear to auscultation       Cardiovascular hypertension, Pt. on medications and Pt. on home beta blockers + CAD and +CHF  + Valvular Problems/Murmurs MR  Rhythm:Regular  1. Left ventricular ejection fraction, by estimation, is 60 to 65%. The  left ventricle has normal function.  2. Right ventricular systolic function is normal. The right ventricular  size is normal.  3. No left atrial/left atrial appendage thrombus was detected.  4. There was no reversal of flow in the pulmonary arteries.   . The mitral valve is grossly normal. Moderate to severe mitral valve  regurgitation. There is mild holosystolic prolapse of both leaflets of the  mitral valve.  5. Tricuspid valve regurgitation is mild to moderate.  6. The aortic valve is tricuspid. Aortic valve regurgitation is not  visualized.   .  Occluded saphenous vein graft to obtuse marginal. .  Obtuse marginal, relatively small with focal 95% stenosis.  Decided against PCI given relatively small size of obtuse marginal and requirement for antithrombotic and antiplatelet therapy increasing the risk of bleeding.  Will enhance anti-ischemic therapy and if symptoms refractory, return later for PCI of OM. Marland Kitchen  Patent sequential vein graft to PDA and PL .  Widely patent left main .  Totally occluded proximal to mid LAD .  Circumflex as noted above the small and the small to moderate-sized obtuse  marginal contains 95% stenosis.  At the time of grafting there was less than 50% stenosis in the region where the stenosis is currently located. .  Total occlusion of proximal to mid RCA .  Mild pulmonary hypertension with mean PA pressure 30 mmHg; V wave 34 mmHg. .  Cardiac output 4.9 L/min .  Pulmonary vascular resistance 2.7 Woods units  ? Initiate isosorbide mononitrate 30 mg/day and titrate up for both relief of symptoms related to mitral regurgitation and potential angina from obtuse marginal. ? If unacceptable symptoms, consider PCI of the relatively small obtuse marginal from right radial or femoral approach. ? Return to the structural heart clinic for consideration of further therapy by Dr. Excell Seltzer relative to mitral regurgitation.     Neuro/Psych negative neurological ROS  negative psych ROS   GI/Hepatic negative GI ROS, Neg liver ROS,   Endo/Other  diabetes, Type 2  Renal/GU negative Renal ROSLab Results      Component                Value               Date                      CREATININE               0.94                05/06/2021           Lab Results      Component  Value               Date                      K                        4.4                 05/06/2021                Musculoskeletal negative musculoskeletal ROS (+)   Abdominal   Peds  Hematology Lab Results      Component                Value               Date                      WBC                      6.1                 05/06/2021                HGB                      12.6 (L)            05/06/2021                HCT                      40.3                05/06/2021                MCV                      103.1 (H)           05/06/2021                PLT                      173                 05/06/2021              Anesthesia Other Findings   Reproductive/Obstetrics                             Anesthesia Physical Anesthesia Plan  ASA:  3  Anesthesia Plan: General   Post-op Pain Management:    Induction: Intravenous  PONV Risk Score and Plan: 2 and Ondansetron and Dexamethasone  Airway Management Planned: Oral ETT  Additional Equipment: Arterial line and TEE  Intra-op Plan:   Post-operative Plan: Extubation in OR  Informed Consent: I have reviewed the patients History and Physical, chart, labs and discussed the procedure including the risks, benefits and alternatives for the proposed anesthesia with the patient or authorized representative who has indicated his/her understanding and acceptance.     Dental advisory given  Plan Discussed with: CRNA and Anesthesiologist  Anesthesia Plan Comments:         Anesthesia Quick Evaluation

## 2021-05-09 NOTE — Progress Notes (Signed)
  [facility]   MULTIDISCIPLINARY HEART VALVE TEAM  Patient doing well s/p TEER. He is hemodynamically stable. Groin sites stable. Arterial line discontinued and transferred to [facility]. Plan for early ambulation after bedrest completed and hopeful discharge over the next 24-48 hours after echocardiogram and cardiac rehabilitation.   Georgie Chard NP-C Structural Heart Team  Pager: [phone]

## 2021-05-09 NOTE — Anesthesia Procedure Notes (Signed)
Arterial Line Insertion Start/End10/13/2022 9:32 AM, 05/09/2021 9:52 AM Performed by: Adair Laundry, CRNA, CRNA  Patient location: Pre-op. Preanesthetic checklist: patient identified, IV checked, risks and benefits discussed and surgical consent Lidocaine 1% used for infiltration Left, radial was placed Catheter size: 20 G Hand hygiene performed  and maximum sterile barriers used   Attempts: 2 Procedure performed without using ultrasound guided technique. Following insertion, dressing applied and Biopatch. Post procedure assessment: normal  Patient tolerated the procedure well with no immediate complications.

## 2021-05-09 NOTE — Anesthesia Procedure Notes (Signed)
Procedure Name: Intubation Date/Time: 05/09/2021 11:02 AM Performed by: Lynnell Chad, CRNA Pre-anesthesia Checklist: Patient identified, Emergency Drugs available, Suction available and Patient being monitored Patient Re-evaluated:Patient Re-evaluated prior to induction Oxygen Delivery Method: Circle System Utilized Preoxygenation: Pre-oxygenation with 100% oxygen Induction Type: IV induction Ventilation: Mask ventilation without difficulty Laryngoscope Size: Miller and 3 Grade View: Grade I Tube type: Oral Tube size: 7.5 mm Number of attempts: 1 Airway Equipment and Method: Stylet and Oral airway Placement Confirmation: ETT inserted through vocal cords under direct vision, positive ETCO2 and breath sounds checked- equal and bilateral Secured at: 22 cm Tube secured with: Tape Dental Injury: Teeth and Oropharynx as per pre-operative assessment

## 2021-05-10 ENCOUNTER — Inpatient Hospital Stay (HOSPITAL_COMMUNITY): Payer: Medicare Other

## 2021-05-10 ENCOUNTER — Encounter (HOSPITAL_COMMUNITY): Payer: Self-pay | Admitting: Internal Medicine

## 2021-05-10 DIAGNOSIS — Z20822 Contact with and (suspected) exposure to covid-19: Secondary | ICD-10-CM | POA: Diagnosis not present

## 2021-05-10 DIAGNOSIS — Z952 Presence of prosthetic heart valve: Secondary | ICD-10-CM

## 2021-05-10 DIAGNOSIS — I340 Nonrheumatic mitral (valve) insufficiency: Secondary | ICD-10-CM | POA: Diagnosis not present

## 2021-05-10 DIAGNOSIS — Z006 Encounter for examination for normal comparison and control in clinical research program: Secondary | ICD-10-CM | POA: Diagnosis not present

## 2021-05-10 DIAGNOSIS — I4819 Other persistent atrial fibrillation: Secondary | ICD-10-CM | POA: Diagnosis not present

## 2021-05-10 LAB — BASIC METABOLIC PANEL
Anion gap: 7 (ref 5–15)
BUN: 15 mg/dL (ref 8–23)
CO2: 24 mmol/L (ref 22–32)
Calcium: 8.4 mg/dL — ABNORMAL LOW (ref 8.9–10.3)
Chloride: 106 mmol/L (ref 98–111)
Creatinine, Ser: 1.18 mg/dL (ref 0.61–1.24)
GFR, Estimated: >60 mL/min (ref >60)
Glucose, Bld: 111 mg/dL — ABNORMAL HIGH (ref 70–99)
Potassium: 4.2 mmol/L (ref 3.5–5.1)
Sodium: 137 mmol/L (ref 135–145)

## 2021-05-10 LAB — ECHOCARDIOGRAM LIMITED
Height: 68 in
MV VTI: 1.39 cm2
Weight: 2256 oz

## 2021-05-10 LAB — CBC
HCT: 33.9 % — ABNORMAL LOW (ref 39.0–52.0)
Hemoglobin: 11.2 g/dL — ABNORMAL LOW (ref 13.0–17.0)
MCH: 33.7 pg (ref 26.0–34.0)
MCHC: 33 g/dL (ref 30.0–36.0)
MCV: 102.1 fL — ABNORMAL HIGH (ref 80.0–100.0)
Platelets: 140 10*3/uL — ABNORMAL LOW (ref 150–400)
RBC: 3.32 MIL/uL — ABNORMAL LOW (ref 4.22–5.81)
RDW: 15.5 % (ref 11.5–15.5)
WBC: 7.3 10*3/uL (ref 4.0–10.5)
nRBC: 0 % (ref 0.0–0.2)

## 2021-05-10 MED ORDER — METOPROLOL SUCCINATE ER 25 MG PO TB24: 25.0000 mg | ORAL_TABLET | Freq: Every day | ORAL | 2 refills | Status: DC

## 2021-05-10 MED ORDER — METOPROLOL SUCCINATE ER 25 MG PO TB24
25.0000 mg | ORAL_TABLET | Freq: Every day | ORAL | Status: DC
  Administered 2021-05-10: 25 mg via ORAL
  Filled 2021-05-10: qty 1

## 2021-05-10 NOTE — Discharge Instructions (Signed)
Home Care Following Your MitraClip Procedure      If you have any questions or concerns you can call the structural heart office at [phone] during normal business hours 8am-4pm. If you have an urgent need after hours or on the weekend, please call [phone] to talk to the on call provider for general cardiology. If you have an emergency that requires immediate attention, please call 911.   Groin Site Care Refer to this sheet in the next few weeks. These instructions provide you with information on caring for yourself after your procedure. Your caregiver may also give you more specific instructions. Your treatment has been planned according to current medical practices, but problems sometimes occur. Call your caregiver if you have any problems or questions after your procedure. HOME CARE INSTRUCTIONS  You may shower 24 hours after the procedure. Remove the bandage (dressing) and gently wash the site with plain soap and water. Gently pat the site dry.   Do not apply powder or lotion to the site.   Do not sit in a bathtub, swimming pool, or whirlpool for 5 to 7 days.   No bending, squatting, or lifting anything over 10 pounds (4.5 kg) as directed by your caregiver.   Inspect the site at least twice daily.   Do not drive home if you are discharged the same day of the procedure. Have someone else drive you.   You may drive 72 hours after the procedure unless otherwise instructed by your caregiver.  What to expect:  Any bruising will usually fade within 1 to 2 weeks.   Blood that collects in the tissue (hematoma) may be painful to the touch. It should usually decrease in size and tenderness within 1 to 2 weeks.  SEEK IMMEDIATE MEDICAL CARE IF:  You have unusual pain at the groin site or down the affected leg.   You have redness, warmth, swelling, or pain at the groin site.   You have drainage (other than a small amount of blood on the dressing).   You have chills.   You have a  fever or persistent symptoms for more than 72 hours.   You have a fever and your symptoms suddenly get worse.   Your leg becomes pale, cool, tingly, or numb.   You have bleeding from the site. Hold pressure on the site until it subsides.    After MitraClip Checklist  Check  Test Description   Follow up appointment in 1-2 weeks  Most of our patients will see our structural heart physician assistant, Katie Thompson, or your primary cardiologist within 1-2 weeks. Your incision site will be checked and you will be cleared to resume all normal activities if you are doing well.     1 month echo and follow up  You will have an echo to check on your heart valve clip and be seen back in the office by Katie Thompson PA-C.   Follow up with your primary cardiologist You will need to be seen by your primary cardiologist in the following 3-6 months after your 1 month appointment in the valve clinic. Often times your Plavix or Aspirin will be discontinued during this time, but this is decided on a case by case basis.    1 year echo and follow up You will have another echo to check on your heart valve after one year and be seen back in the office by Katie Thompson. This your last structural heart visit.   Bacterial endocarditis prophylaxis  You will   have to take antibiotics for the rest of your life before all dental procedures (even dental cleanings) to protect your heart valve from potential infection. Antibiotics are also required before some surgeries. Please check with your cardiologist before scheduling any surgeries. Also, please make sure to tell us if you have a penicillin allergy as you will require an alternative antibiotic.    ______________  Your Implant Identification Card Following your procedure, you will receive an Implant Identification Card, which your doctor will fill out and which you must carry with you at all times. Show your Implant Identification Card if you report to an emergency room.  This card identifies you as a patient who has had a MitraClip device implanted. If you require a magnetic resonance imaging (MRI) scan, tell your doctor or MRI technician that you have a MitraClip device implanted. Test results indicate that patients with the MitraClip device can safely undergo MRI scans under certain conditions described on the card.  

## 2021-05-10 NOTE — Discharge Summary (Signed)
[facility]   MULTIDISCIPLINARY HEART VALVE TEAM  Discharge Summary    Patient ID: Micheal [name] MRN: [id number]; DOB: January 30, 1941  Admit date: 05/09/2021 Discharge date: 05/10/2021  Primary Care Provider: Lorenda Ishihara, MD  Primary Cardiologist: Lesleigh Noe, MD /Dr. Excell Seltzer, MD/Dr. Lynnette Caffey, MD ([place])  Discharge Diagnoses    Principal Problem:   S/P MVR (mitral valve replacement) Active Problems:   HTN (hypertension)   Hyperlipidemia   Diabetes (HCC)   Atrial fibrillation (HCC)   Nonrheumatic mitral valve insufficiency  Allergies No Known Allergies  Diagnostic Studies/Procedures    TEER 05/09/21:  Successful transcatheter mitral edge to edge repair with one XTW clip positioned on A2/P2 reducing severe degenerative nonrheumatic mitral regurgitation to mild to moderate mitral regurgitation.   RECOMMENDATIONS:  Admit for observation, restart home anticoagulation, and obtain limited echocardiogram tomorrow. _____________   Echo 05/10/21: Completed however pending formal read at the time of discharge   History of Present Illness     Micheal [name] is a 80 y.o. male with a history of CAD s/p CABG 2007, DM2, persistent atrial fibrillation on Eliquis, and newly dx severe mitral regurgitation who presented to [facility] on 05/09/21 for planned TEER.   Hospital Course     Mr. Carline was referred to Dr. Excell Seltzer from Dr. Katrinka Blazing, initially seen 03/05/21 for consideration of treatment options regarding severe mitral regurgitation. He had recently been diagnosed with atrial fibrillation 02/24/21 when he presented with dyspnea, fatigue, and clinical signs of congestive heart failure. He underwent TEE guided cardioversion 02/26/21 and was found to have moderate/severe mitral regurgitation with bileaflet prolapse. He subsequently underwent right and left cardiac catheterization 04/03/2021 which demonstrated occlusion of the SBG>OM with severe disease in the native OM,  however this was noted to be a very small vessel with recommendations for continued medical treatment. The SVG to PDA and PLA and the LIMA to LAD graft were both patent. There was 32 mmHg V waves in the wedge position and with moderate pulmonary hypertension, consistent with hemodynamically important mitral regurgitation.  He was seen by Dr. Excell Seltzer 04/08/21 with essentially unchanged symptoms of exertional dyspnea and fatigue. He was felt to be a good candidate for transcatheter edge-to-edge mitral repair although he was referred for formal surgical consultation as part of a multidisciplinary team approach to his care. He saw Dr. Cliffton Asters 04/26/21 and was felt to be high risk for re-do sternotomy therefore felt better served with MitraClip placement.   He presented to [facility] 05/09/21 for scheduled TEER with successful transcatheter mitral edge to edge repair with one XTW clip positioned on A2/P2 reducing severe degenerative nonrheumatic mitral regurgitation to mild to moderate mitral regurgitation. Recommendations were for admission for overnight observation with restart home anticoagulation at prior dose. Plan for echocardiogram and cardiac rehabilitation prior to discharge. He remained rather bradycardic however was asymptomatic. Will reduce Toprol to 25mg  QD from 50mg  QD.   The patient was seen and examined by Dr. who feels that he is stable and ready for discharge today, 05/10/21.   Consultants: None   _____________  Discharge Vitals Blood pressure (!) 128/55, pulse 73, temperature 98.1 F (36.7 C), temperature source Oral, resp. rate 20, height 5\' 8"  (1.727 m), weight 64 kg, SpO2 96 %.  Filed Weights   05/09/21 0859  Weight: 64 kg   General: Well developed, well nourished, NAD Lungs:Clear to ausculation bilaterally. No wheezes, rales, or rhonchi. Breathing is unlabored. Cardiovascular: Irregularly irregular. Soft murmur Abdomen: Soft, non-tender, non-distended.  No obvious abdominal  masses. Extremities: No edema. Neuro: Alert and oriented. No focal deficits. No facial asymmetry. MAE spontaneously. Psych: Responds to questions appropriately with normal affect.    Labs & Radiologic Studies    CBC Recent Labs    05/10/21 0049  WBC 7.3  HGB 11.2*  HCT 33.9*  MCV 102.1*  PLT 140*   Basic Metabolic Panel Recent Labs    28/00/34 0049  NA 137  K 4.2  CL 106  CO2 24  GLUCOSE 111*  BUN 15  CREATININE 1.18  CALCIUM 8.4*   Liver Function Tests No results for input(s): AST, ALT, ALKPHOS, BILITOT, PROT, ALBUMIN in the last 72 hours. No results for input(s): LIPASE, AMYLASE in the last 72 hours. Cardiac Enzymes No results for input(s): CKTOTAL, CKMB, CKMBINDEX, TROPONINI in the last 72 hours. BNP Invalid input(s): POCBNP D-Dimer No results for input(s): DDIMER in the last 72 hours. Hemoglobin A1C No results for input(s): HGBA1C in the last 72 hours. Fasting Lipid Panel No results for input(s): CHOL, HDL, LDLCALC, TRIG, CHOLHDL, LDLDIRECT in the last 72 hours. Thyroid Function Tests No results for input(s): TSH, T4TOTAL, T3FREE, THYROIDAB in the last 72 hours.  Invalid input(s): FREET3 _____________  DG Chest 2 View  Result Date: 05/06/2021 CLINICAL DATA:  80 year old male under preoperative evaluation prior to mitral valve surgery. EXAM: CHEST - 2 VIEW COMPARISON:  Chest x-ray 02/24/2021. FINDINGS: Lung volumes are normal. No consolidative airspace disease. No pleural effusions. No pneumothorax. No pulmonary nodule or mass noted. Pulmonary vasculature and the cardiomediastinal silhouette are within normal limits. Atherosclerosis in the thoracic aorta. Status post median sternotomy for CABG. IMPRESSION: 1.  No radiographic evidence of acute cardiopulmonary disease. 2. Aortic atherosclerosis. Electronically Signed   By: Trudie Reed M.D.   On: 05/06/2021 13:56   CARDIAC CATHETERIZATION  Result Date: 05/10/2021  Successful transcatheter mitral edge to  edge repair with one XTW clip positioned on A2/P2 reducing severe degenerative nonrheumatic mitral regurgitation to mild to moderate mitral regurgitation. RECOMMENDATIONS:  Admit for observation, restart home anticoagulation, and obtain limited echocardiogram tomorrow.   ECHO TEE  Result Date: 05/09/2021    TRANSESOPHOGEAL ECHO REPORT   Patient Name:   TODD JELINSKI Date of Exam: 05/09/2021 Medical Rec #:  [id number]       Height:       68.0 in Accession #:    [id number]      Weight:       141.0 lb Date of Birth:  06-09-1941       BSA:          1.762 m Patient Age:    80 years        BP:           141/47 mmHg Patient Gender: M               HR:           72 bpm. Exam Location:  Inpatient Procedure: Transesophageal Echo, Color Doppler, Cardiac Doppler and 3D Echo Indications:     Severe mitral insufficiency  History:         Patient has prior history of Echocardiogram examinations, most                  recent 02/26/2021. CAD; Risk Factors:Hypertension, Dyslipidemia                  and Diabetes.  Mitral Valve: XTW MitraClip A2/P2 valve is present in the                  mitral position. Procedure Date: 05/09/2021.  Sonographer:     Leta Jungling RDCS Referring Phys:  [phone] MICHAEL COOPER Diagnosing Phys: Riley Lam MD PROCEDURE: After discussion of the risks and benefits of a TEE, an informed consent was obtained from the patient. The patient was intubated. The transesophogeal probe was passed without difficulty through the esophogus of the patient. Imaged were obtained with the patient in a supine position. Sedation performed by different physician. The patient was monitored while under deep sedation. Anesthestetic sedation was provided intravenously by Anesthesiology: 100mg  of Propofol, 40mg  of Lidocaine. Image quality was good. The patient's vital signs; including heart rate, blood pressure, and oxygen saturation; remained stable throughout the procedure. The patient developed no  complications during the procedure. IMPRESSIONS  1. Pre Mitral Clip procedure, bilateral valve prolapse and thickening. There was severe mitral regurgitation comprised of a large A2-P2 jet and a smaller A1-P1 jet; both mechanisms related to prolapse. There was blunting of the pulmonary veins without reversal. Valve mean gradient of 1 mm Hg at rate of 76 bpm. 3D MVA 7.11 cm2.  2. Using extensive 3D echo guidance a single XTW clip was placed at the lateral position of A2-P2. There was 1.5+ mitral regurgitation: a mild A1-P1 jet and a mild residual A2-P2 jet lateral to the clip, seen at higher loading conditions (SBP 160). Improvement in pulmonary vein blunting. 3D Double orifice mitral valve area 3.46 cm2. Mean gradient of 2.6 mm Hg at heart rate of 67 bpm.     Procedure Date: 05/09/2021.  3. Post transseptal punction,there was a small residual left to right shunt.  4. Left ventricular ejection fraction, by estimation, is 60 to 65%. The left ventricle has normal function.  5. Right ventricular systolic function is low normal. The right ventricular size is normal.  6. Left atrial size was severely dilated. No left atrial/left atrial appendage thrombus was detected.  7. Right atrial size was severely dilated.  8. There is mild (Grade II) plaque involving the descending aorta.  9. The aortic valve is tricuspid. There is mild thickening of the aortic valve. Aortic valve regurgitation is not visualized. No aortic stenosis is present. FINDINGS  Left Ventricle: Left ventricular ejection fraction, by estimation, is 60 to 65%. The left ventricle has normal function. The left ventricular internal cavity size was normal in size. Right Ventricle: The right ventricular size is normal. Right vetricular wall thickness was not assessed. Right ventricular systolic function is low normal. Left Atrium: Left atrial size was severely dilated. No left atrial/left atrial appendage thrombus was detected. Right Atrium: Right atrial size was  severely dilated. Pericardium: There is no evidence of pericardial effusion. Mitral Valve: The mitral valve is abnormal. Moderate to severe mitral valve regurgitation. There is a XTW MitraClip A2/P2 present in the mitral position. Procedure Date: 05/09/2021. MV peak gradient, 2.9 mmHg. The mean mitral valve gradient is 1.0 mmHg. Tricuspid Valve: The tricuspid valve is normal in structure. Tricuspid valve regurgitation is mild . No evidence of tricuspid stenosis. Aortic Valve: The aortic valve is tricuspid. There is mild thickening of the aortic valve. Aortic valve regurgitation is not visualized. No aortic stenosis is present. Pulmonic Valve: The pulmonic valve was normal in structure. Pulmonic valve regurgitation is trivial. Aorta: The aortic root, ascending aorta, aortic arch and descending aorta are all structurally normal, with no evidence  of dilitation or obstruction. There is mild (Grade II) plaque involving the descending aorta. IAS/Shunts: Evidence of atrial level shunting detected by color flow Doppler.  LEFT VENTRICLE PLAX 2D LVOT diam:     1.70 cm LV SV:         30 LV SV Index:   17 LVOT Area:     2.27 cm  AORTIC VALVE LVOT Vmax:   66.50 cm/s LVOT Vmean:  41.200 cm/s LVOT VTI:    0.133 m MITRAL VALVE MV Peak grad: 2.9 mmHg        SHUNTS MV Mean grad: 1.0 mmHg        Systemic VTI:  0.13 m MV Vmax:      0.84 m/s        Systemic Diam: 1.70 cm MV Vmean:     40.9 cm/s MR Peak grad:    120.1 mmHg MR Mean grad:    66.0 mmHg MR Vmax:         548.00 cm/s MR Vmean:        375.0 cm/s MR PISA:         2.26 cm MR PISA Eff ROA: 17 mm MR PISA Radius:  0.60 cm Riley Lam MD Electronically signed by Riley Lam MD Signature Date/Time: 05/09/2021/6:57:58 PM    Final    Disposition   Pt is being discharged home today in good condition.  Follow-up Plans & Appointments    Follow-up Information     Filbert Schilder, NP. Go on 05/16/2021.   Specialty: Cardiology Why: at 12:30am. Please arrive at  12:15am Contact information: [address] [place] [address] [phone]                Discharge Medications   Allergies as of 05/10/2021   No Known Allergies      Medication List     TAKE these medications    apixaban 5 MG Tabs tablet Commonly known as: ELIQUIS Take 1 tablet (5 mg total) by mouth 2 (two) times daily.   CINNAMON PO Take 1,000 mg by mouth daily.   Co Q-10 200 MG Caps Take 200 mg by mouth daily.   dorzolamide-timolol 22.3-6.8 MG/ML ophthalmic solution Commonly known as: COSOPT Place 1 drop into both eyes 2 (two) times daily.   FISH OIL PO Take 1 capsule by mouth daily.   GLUCOSAMINE CHONDR COMPLEX PO Take 1 tablet by mouth daily.   isosorbide mononitrate 30 MG 24 hr tablet Commonly known as: IMDUR Take 1 tablet (30 mg total) by mouth daily.   L-Carnitine 500 MG Tabs Take 500 mg by mouth in the morning.   losartan-hydrochlorothiazide 50-12.5 MG tablet Commonly known as: HYZAAR TAKE 1 TABLET BY MOUTH EVERY DAY   metFORMIN 1000 MG tablet Commonly known as: GLUCOPHAGE Take 1,000 mg by mouth at bedtime.   metoprolol succinate 25 MG 24 hr tablet Commonly known as: TOPROL-XL Take 1 tablet (25 mg total) by mouth daily. Start taking on: May 11, 2021 What changed:  medication strength how much to take   NF Formulas Testosterone Caps Take 1 capsule by mouth in the morning.   pravastatin 20 MG tablet Commonly known as: PRAVACHOL Take 20 mg by mouth at bedtime.   PRESERVISION AREDS 2 PO Take 1 capsule by mouth 2 (two) times daily.   QUERCETIN PO Take 1 tablet by mouth daily.   Systane Balance 0.6 % Soln Generic drug: Propylene Glycol Place 1 drop into both eyes daily as needed (  dry eyes).   vitamin B-12 1000 MCG tablet Commonly known as: CYANOCOBALAMIN Take 1,000 mcg by mouth daily.   Vitamin D 50 MCG (2000 UT) tablet Take 2,000 Units by mouth in the morning and at bedtime.   vitamin E 180 MG (400  UNITS) capsule Take 400 Units by mouth daily.   Vyzulta 0.024 % Soln Generic drug: Latanoprostene Bunod Place 1 drop into the right eye at bedtime.        Outstanding Labs/Studies   None   Duration of Discharge Encounter   Greater than 30 minutes including physician time.  SignedGeorgie Chard, NP 05/10/2021, 10:55 AM [phone]

## 2021-05-10 NOTE — Progress Notes (Signed)
CARDIAC REHAB PHASE I   PRE:  Rate/Rhythm: 51 Afib  BP:  Sitting: 105/58      SaO2: 95 RA  MODE:  Ambulation: 200 ft   POST:  Rate/Rhythm: 78 Afib  BP:  Sitting: 127/49    SaO2: 92 RA   Pt ambulated 228ft in hallway assist of one with slow gait. Pt states improvement in his breathing. Denies dizziness or pain. Pt assisted to BR than returned to EOB. Bed alarm on. Reviewed site care and restrictions. Encouraged continued ambulation with emphasis on safety. Pt denies further questions or concerns at this time, hopeful for d/c today.  [id number] Reynold Bowen, RN BSN 05/10/2021 10:21 AM

## 2021-05-10 NOTE — Progress Notes (Signed)
Echocardiogram 2D Echocardiogram has been performed.  Warren Lacy Saige Busby RDCS 05/10/2021, 10:14 AM

## 2021-05-13 ENCOUNTER — Telehealth: Payer: Self-pay | Admitting: Cardiology

## 2021-05-13 ENCOUNTER — Telehealth: Payer: Self-pay | Admitting: Cardiovascular Disease

## 2021-05-13 NOTE — Telephone Encounter (Signed)
Patient states he is returning a call from a few minutes ago today.

## 2021-05-13 NOTE — Telephone Encounter (Signed)
I spoke with patient. He reports he spoke with Georgie Chard, NP earlier today and has no questions.  Patient states there was a message on his voicemail to call our office.  I told patient I did not see that a call had been placed. Patient aware of appointment on 10/20.  Patient then reported his ASA had been stopped.  I confirmed with him that ASA is not on his current list.  He is asking if he can take tylenol if he has a headache.  I told him this would be OK.

## 2021-05-13 NOTE — Telephone Encounter (Signed)
  [facility]   MULTIDISCIPLINARY HEART VALVE TEAM   Patient contacted regarding discharge from [facility] on 05/10/21  Patient understands to follow up with Structural Heart APP on 05/16/21 at [address].  Patient understands discharge instructions? yes Patient understands medications and regimen? yes Patient understands to bring all medications to this visit? yes   Georgie Chard NP-C Structural Heart Team  Pager: [phone]

## 2021-05-13 NOTE — Progress Notes (Signed)
[facility]   MULTIDISCIPLINARY HEART VALVE CLINIC                                     Cardiology Office Note:    Date:  05/16/2021   ID:  Micheal [name], DOB [birth year], MRN [id number]  PCP:  Lorenda Ishihara, MD  [facility] Cardiologist:  Micheal Noe, MD/ Dr. Excell Seltzer, MD/Dr. Lynnette Caffey, MD ([place])   Referring MD: Lorenda [name],*   Chief Complaint  Patient presents with   Follow-up    1 week s/p Mitraclip    History of Present Illness:    Micheal [name] is a 80 y.o. male with a hx of CAD s/p CABG 2007, DM2, persistent atrial fibrillation on Eliquis, and newly dx severe mitral regurgitation who underwent planned TEER with Dr. Excell [name] 05/09/21 and is here for his one week follow up.   Micheal [name] was referred to Dr. Excell [name] from Dr. Katrinka [name], initially seen 03/05/21 for consideration of treatment options regarding severe mitral regurgitation. He had recently been diagnosed with atrial fibrillation 02/24/21 when he presented with dyspnea, fatigue, and clinical signs of congestive heart failure. He underwent TEE guided cardioversion 02/26/21 and was found to have moderate/severe mitral regurgitation with bileaflet prolapse. He subsequently underwent right and left cardiac catheterization 04/03/2021 which demonstrated occlusion of the SBG>OM with severe disease in the native OM, however this was noted to be a very small vessel with recommendations for continued medical treatment. The SVG to PDA and PLA and the LIMA to LAD graft were both patent. There was 32 mmHg V waves in the wedge position and with moderate pulmonary hypertension, consistent with hemodynamically important mitral regurgitation.   He was seen by Dr. Excell [name] 04/08/21 with essentially unchanged symptoms of exertional dyspnea and fatigue. He was felt to be a good candidate for transcatheter edge-to-edge mitral repair although he was referred for formal surgical consultation as part of a  multidisciplinary team approach to his care. He saw Dr. Cliffton [name] 04/26/21 and was felt to be high risk for re-do sternotomy therefore felt better served with MitraClip placement.    He presented to [facility] 05/09/21 for scheduled TEER with successful transcatheter mitral edge to edge repair with one XTW clip positioned on A2/P2 reducing severe degenerative nonrheumatic mitral regurgitation to mild to moderate mitral regurgitation. Post clip recommendations were to restart home anticoagulation at prior dose. He remained rather bradycardic however was asymptomatic therefore his Toprol was reduced to 25mg  QD from 50mg  QD.   Today he is here alone and reports that he is amazed at how much better he feels. He can walk around his neighborhood without SOB, something that was simply unattainable prior to surgery. Prior to his clip, he had a constant headache at the base of his neck which is now gone. He reports improved energy. He was previously taking maps daily and reports he does not feel the need to do this any longer. He is back to playing golf and feels great. He denies chest pain, palpitations, orthopnea, LE edema, dizziness or syncope.   Past Medical History:  Diagnosis Date   CAD (coronary artery disease)    with Cabg 2008   Claudication (HCC)    Diabetes (HCC)    HTN (hypertension)    Hypercholesteremia    Hyperlipidemia    S/P mitral valve clip implantation 05/09/2021   one XTW clip positioned on A2/P2 per  Dr. Jorge [name]    Past Surgical History:  Procedure Laterality Date   ABDOMINAL AORTOGRAM W/LOWER EXTREMITY N/A 09/22/2018   Procedure: ABDOMINAL AORTOGRAM W/LOWER EXTREMITY;  Surgeon: Iran Ouch, MD;  Location: [facility] INVASIVE CV LAB;  Service: Cardiovascular;  Laterality: N/A;   CARDIAC CATHETERIZATION  04/03/2021   CARDIOVERSION N/A 02/26/2021   Procedure: CARDIOVERSION;  Surgeon: Elease Hashimoto Deloris Ping, MD;  Location: [facility] ENDOSCOPY;  Service: Cardiovascular;  Laterality: N/A;    CORONARY ARTERY BYPASS GRAFT  2008   Coronary bypass surgery     2008   EYE SURGERY Bilateral    laser surgery by Dr. Jerolyn Center   MITRAL VALVE REPAIR N/A 05/09/2021   Procedure: MITRAL VALVE REPAIR;  Surgeon: Orbie Pyo, MD;  Location: [facility] INVASIVE CV LAB;  Service: Cardiovascular;  Laterality: N/A;   RIGHT/LEFT HEART CATH AND CORONARY/GRAFT ANGIOGRAPHY N/A 04/03/2021   Procedure: RIGHT/LEFT HEART CATH AND CORONARY/GRAFT ANGIOGRAPHY;  Surgeon: Lyn Records, MD;  Location: [facility] INVASIVE CV LAB;  Service: Cardiovascular;  Laterality: N/A;   TEE WITHOUT CARDIOVERSION N/A 02/26/2021   Procedure: TRANSESOPHAGEAL ECHOCARDIOGRAM (TEE);  Surgeon: Elease Hashimoto Deloris Ping, MD;  Location: [facility] ENDOSCOPY;  Service: Cardiovascular;  Laterality: N/A;   TEE WITHOUT CARDIOVERSION N/A 05/09/2021   Procedure: TRANSESOPHAGEAL ECHOCARDIOGRAM (TEE);  Surgeon: Orbie Pyo, MD;  Location: [facility] INVASIVE CV LAB;  Service: Cardiovascular;  Laterality: N/A;    Current Medications: Current Meds  Medication Sig   apixaban (ELIQUIS) 5 MG TABS tablet Take 1 tablet (5 mg total) by mouth 2 (two) times daily.   Cholecalciferol (VITAMIN D) 50 MCG (2000 UT) tablet Take 2,000 Units by mouth in the morning and at bedtime.   CINNAMON PO Take 1,000 mg by mouth daily.   Coenzyme Q10 (CO Q-10) 200 MG CAPS Take 200 mg by mouth daily.    dorzolamide-timolol (COSOPT) 22.3-6.8 MG/ML ophthalmic solution Place 1 drop into both eyes 2 (two) times daily.   Glucosamine-Chondroitin (GLUCOSAMINE CHONDR COMPLEX PO) Take 1 tablet by mouth daily.   isosorbide mononitrate (IMDUR) 30 MG 24 hr tablet Take 1 tablet (30 mg total) by mouth daily.   levOCARNitine (L-CARNITINE) 500 MG TABS Take 500 mg by mouth in the morning.   losartan-hydrochlorothiazide (HYZAAR) 50-12.5 MG tablet TAKE 1 TABLET BY MOUTH EVERY DAY (Patient taking differently: Take 1 tablet by mouth daily.)   metFORMIN (GLUCOPHAGE) 1000 MG tablet Take 1,000 mg by mouth at bedtime.     metoprolol succinate (TOPROL-XL) 25 MG 24 hr tablet Take 1 tablet (25 mg total) by mouth daily.   Misc Natural Products (NF FORMULAS TESTOSTERONE) CAPS Take 1 capsule by mouth in the morning.   Multiple Vitamins-Minerals (PRESERVISION AREDS 2 PO) Take 1 capsule by mouth 2 (two) times daily.   Omega-3 Fatty Acids (FISH OIL PO) Take 1 capsule by mouth daily.   pravastatin (PRAVACHOL) 20 MG tablet Take 20 mg by mouth at bedtime.   Propylene Glycol (SYSTANE BALANCE) 0.6 % SOLN Place 1 drop into both eyes daily as needed (dry eyes).   QUERCETIN PO Take 1 tablet by mouth daily.   vitamin B-12 (CYANOCOBALAMIN) 1000 MCG tablet Take 1,000 mcg by mouth daily.   vitamin E 400 UNIT capsule Take 400 Units by mouth daily.   VYZULTA 0.024 % SOLN Place 1 drop into the right eye at bedtime.     Allergies:   Patient has no known allergies.   Social History   Socioeconomic History   Marital status: Widowed    Spouse  name: Not on file   Number of children: Not on file   Years of education: Not on file   Highest education level: Not on file  Occupational History   Not on file  Tobacco Use   Smoking status: Former   Smokeless tobacco: Never  Vaping Use   Vaping Use: Never used  Substance and Sexual Activity   Alcohol use: Not Currently    Alcohol/week: 1.0 standard drink    Types: 1 Glasses of wine per week   Drug use: No   Sexual activity: Yes  Other Topics Concern   Not on file  Social History Narrative   Not on file   Social Determinants of Health   Financial Resource Strain: Not on file  Food Insecurity: Not on file  Transportation Needs: Not on file  Physical Activity: Not on file  Stress: Not on file  Social Connections: Not on file     Family History: The patient's family history includes Healthy in his sister, sister, and sister; Heart attack in his maternal grandfather; Heart disease in his father.  ROS:   Please see the history of present illness.    All other systems  reviewed and are negative.  EKGs/Labs/Other Studies Reviewed:    The following studies were reviewed today:  Echocardiogram 05/10/21:   1. The mitral valve has been repaired with TEER approach: Am XTW Mitra  clip was placed in the A2-P2 position. The is mild, +1 mitral  regurgitation seen as two jets at the A1-P1 and A2-P2 position. Procedure  Date 05/09/21. Double orifice morphology seen  in short axis.      The mean mitral valve gradient is 3.0 mmHg with average heart rate of  56 bpm. There is residual blunting of the pulmonary veins.   2. Left ventricular ejection fraction, by estimation, is 60 to 65%. The  left ventricle has normal function. The left ventricle has no regional  wall motion abnormalities.   3. Right ventricular systolic function is normal. The right ventricular  size is normal. There is moderately elevated pulmonary artery systolic  pressure. The estimated right ventricular systolic pressure is 56.2 mmHg.   4. Left atrial size was severely dilated.   5. Tricuspid valve regurgitation is moderate.   6. The aortic valve is tricuspid. Aortic valve regurgitation is not  visualized. No aortic stenosis is present.   7. The inferior vena cava is dilated in size with <50% respiratory  variability, suggesting right atrial pressure of 15 mmHg.   Comparison(s): A prior study was performed on 05/09/21. Improvement in  mitral regurgitation from post procedural imaging at different loading  conditions (improved blood pressure).   TEER 05/09/21:   Successful transcatheter mitral edge to edge repair with one XTW clip positioned on A2/P2 reducing severe degenerative nonrheumatic mitral regurgitation to mild to moderate mitral regurgitation.   RECOMMENDATIONS:  Admit for observation, restart home anticoagulation, and obtain limited echocardiogram tomorrow. _____________  EKG:  EKG is not ordered today.    Recent Labs: 02/24/2021: B Natriuretic Peptide 1,394.6 02/25/2021: TSH  1.897 05/06/2021: ALT 20 05/10/2021: BUN 15; Creatinine, Ser 1.18; Hemoglobin 11.2; Platelets 140; Potassium 4.2; Sodium 137  Recent Lipid Panel    Component Value Date/Time   CHOL 130 02/25/2021 0249   CHOL 86 (L) 10/10/2019 0744   TRIG 102 02/25/2021 0249   HDL 35 (L) 02/25/2021 0249   HDL 44 10/10/2019 0744   CHOLHDL 3.7 02/25/2021 0249   VLDL 20 02/25/2021 0249   LDLCALC  75 02/25/2021 0249   LDLCALC 24 10/10/2019 0744     Physical Exam:    VS:  BP 132/68   Pulse (!) 57   Ht 5\' 8"  (1.727 m)   Wt 147 lb 9.6 oz (67 kg)   SpO2 97%   BMI 22.44 kg/m     Wt Readings from Last 3 Encounters:  05/16/21 147 lb 9.6 oz (67 kg)  05/09/21 141 lb (64 kg)  05/06/21 149 lb 8 oz (67.8 kg)    General: Well developed, well nourished, NAD Neck: Negative for carotid bruits. No JVD Lungs:Clear to ausculation bilaterally. Breathing is unlabored. Cardiovascular: RRR with S1 S2. Soft murmur Extremities: No edema Neuro: Alert and oriented. No focal deficits. No facial asymmetry. MAE spontaneously. Psych: Responds to questions appropriately with normal affect.    ASSESSMENT/PLAN:    1. Severe MR s/p MitraClip: Successful TEER repair with one XTW clip positioned on A2/P2 reducing severe degenerative nonrheumatic mitral regurgitation to mild to moderate mitral regurgitation. Post clip recommendations were to restart home anticoagulation at prior dose. He remained rather bradycardic however was asymptomatic therefore his Toprol was reduced to 25mg  QD from 50mg  QD. SBE discussed and sent to preferred pharmacy, Amoxicillin 2g PO 1 hour prior to dental procedures.   2. CAD s/p CABG 2007: Doing well with no anginal symptoms. Continue ASA, statin, and beta blocker   3. DM2: Continue current regimen   4. Persistent atrial fibrillation: Maintaining NSR today. Continue on Eliquis   Medication Adjustments/Labs and Tests Ordered: Current medicines are reviewed at length with the patient today.   Concerns regarding medicines are outlined above.  No orders of the defined types were placed in this encounter.  No orders of the defined types were placed in this encounter.   Patient Instructions  Medication Instructions:  Your physician recommends that you continue on your current medications as directed. Please refer to the Current Medication list given to you today.  *If you need a refill on your cardiac medications before your next appointment, please call your pharmacy*   Lab Work: NONE If you have labs (blood work) drawn today and your tests are completely normal, you will receive your results only by: MyChart Message (if you have MyChart) OR A paper copy in the mail If you have any lab test that is abnormal or we need to change your treatment, we will call you to review the results.   Testing/Procedures: NONE   Follow-Up: At [facility], you and your health needs are our priority.  As part of our continuing mission to provide you with exceptional heart care, we have created designated Provider Care Teams.  These Care Teams include your primary Cardiologist (physician) and Advanced Practice Providers (APPs -  Physician Assistants and Nurse Practitioners) who all work together to provide you with the care you need, when you need it.  We recommend signing up for the patient portal called "MyChart".  Sign up information is provided on this After Visit Summary.  MyChart is used to connect with patients for Virtual Visits (Telemedicine).  Patients are able to view lab/test results, encounter notes, upcoming appointments, etc.  Non-urgent messages can be sent to your provider as well.   To learn more about what you can do with MyChart, go to .    Your next appointment:   KEEP SCHEDULED FOLLOW-UP  The format for your next appointment:   In Person  Provider:   2008, NP     Signed, [facility],  NP  05/16/2021 2:20 PM    [facility]

## 2021-05-14 NOTE — Anesthesia Postprocedure Evaluation (Signed)
Anesthesia Post Note  Patient: Micheal [name]  Procedure(s) Performed: MITRAL VALVE REPAIR TRANSESOPHAGEAL ECHOCARDIOGRAM (TEE)     Patient location during evaluation: Cath Lab Anesthesia Type: General Level of consciousness: awake and alert Pain management: pain level controlled Vital Signs Assessment: post-procedure vital signs reviewed and stable Respiratory status: spontaneous breathing, nonlabored ventilation, respiratory function stable and patient connected to nasal cannula oxygen Cardiovascular status: blood pressure returned to baseline and stable Postop Assessment: no apparent nausea or vomiting Anesthetic complications: no   No notable events documented.  Last Vitals:  Vitals:   05/10/21 0628 05/10/21 0723  BP:  (!) 128/55  Pulse: 66 73  Resp: 16 20  Temp:  36.7 C  SpO2: 95% 96%    Last Pain:  Vitals:   05/10/21 0723  TempSrc: Oral  PainSc: 0-No pain                 Burnham Trost

## 2021-05-16 ENCOUNTER — Encounter: Payer: Self-pay | Admitting: Cardiology

## 2021-05-16 ENCOUNTER — Other Ambulatory Visit: Payer: Self-pay

## 2021-05-16 ENCOUNTER — Ambulatory Visit (INDEPENDENT_AMBULATORY_CARE_PROVIDER_SITE_OTHER): Payer: Medicare Other | Admitting: Cardiology

## 2021-05-16 VITALS — BP 132/68 | HR 57 | Ht 68.0 in | Wt 147.6 lb

## 2021-05-16 DIAGNOSIS — I340 Nonrheumatic mitral (valve) insufficiency: Secondary | ICD-10-CM

## 2021-05-16 DIAGNOSIS — Z95818 Presence of other cardiac implants and grafts: Secondary | ICD-10-CM

## 2021-05-16 DIAGNOSIS — I10 Essential (primary) hypertension: Secondary | ICD-10-CM | POA: Diagnosis not present

## 2021-05-16 DIAGNOSIS — E785 Hyperlipidemia, unspecified: Secondary | ICD-10-CM | POA: Diagnosis not present

## 2021-05-16 DIAGNOSIS — I2510 Atherosclerotic heart disease of native coronary artery without angina pectoris: Secondary | ICD-10-CM | POA: Diagnosis not present

## 2021-05-16 DIAGNOSIS — Z9889 Other specified postprocedural states: Secondary | ICD-10-CM

## 2021-05-16 NOTE — Patient Instructions (Signed)
Medication Instructions:  Your physician recommends that you continue on your current medications as directed. Please refer to the Current Medication list given to you today.  *If you need a refill on your cardiac medications before your next appointment, please call your pharmacy*   Lab Work: NONE If you have labs (blood work) drawn today and your tests are completely normal, you will receive your results only by: MyChart Message (if you have MyChart) OR A paper copy in the mail If you have any lab test that is abnormal or we need to change your treatment, we will call you to review the results.   Testing/Procedures: NONE   Follow-Up: At [facility], you and your health needs are our priority.  As part of our continuing mission to provide you with exceptional heart care, we have created designated Provider Care Teams.  These Care Teams include your primary Cardiologist (physician) and Advanced Practice Providers (APPs -  Physician Assistants and Nurse Practitioners) who all work together to provide you with the care you need, when you need it.  We recommend signing up for the patient portal called "MyChart".  Sign up information is provided on this After Visit Summary.  MyChart is used to connect with patients for Virtual Visits (Telemedicine).  Patients are able to view lab/test results, encounter notes, upcoming appointments, etc.  Non-urgent messages can be sent to your provider as well.   To learn more about what you can do with MyChart, go to [email or web address].    Your next appointment:   KEEP SCHEDULED FOLLOW-UP  The format for your next appointment:   In Person  Provider:   Georgie Chard, NP

## 2021-06-04 NOTE — Progress Notes (Signed)
[facility]   MULTIDISCIPLINARY HEART VALVE CLINIC                                     Cardiology Office Note:    Date:  06/10/2021   ID:  Micheal [name], DOB March 22, 1941, MRN [id number]  PCP:  Lorenda Ishihara, MD  [facility] Cardiologist:  Lesleigh Noe, MD/Dr. Excell Seltzer, MD/Dr. Lynnette Caffey, MD ([place])     [facility] Electrophysiologist:  None   Referring MD: Lorenda Ishihara,*   Chief Complaint  Patient presents with   Follow-up    1 month s/p MitraClip    History of Present Illness:    Micheal [name] is a 80 y.o. male with a hx of CAD s/p CABG 2007, DM2, persistent atrial fibrillation on Eliquis, and newly dx severe mitral regurgitation who underwent planned TEER with Dr. Excell Seltzer 05/09/21 and is here for his one week follow up.    Micheal [name] was referred to Dr. Excell Seltzer from Dr. Katrinka Blazing, initially seen 03/05/21 for consideration of treatment options regarding severe mitral regurgitation. He had recently been diagnosed with atrial fibrillation 02/24/21 when he presented with dyspnea, fatigue, and clinical signs of congestive heart failure. He underwent TEE guided cardioversion 02/26/21 and was found to have moderate/severe mitral regurgitation with bileaflet prolapse. He subsequently underwent right and left cardiac catheterization 04/03/2021 which demonstrated occlusion of the SBG>OM with severe disease in the native OM, however this was noted to be a very small vessel with recommendations for continued medical treatment. The SVG to PDA and PLA and the LIMA to LAD graft were both patent. There was 32 mmHg V waves in the wedge position and with moderate pulmonary hypertension, consistent with hemodynamically important mitral regurgitation.   He was seen by Dr. Excell Seltzer 04/08/21 with essentially unchanged symptoms of exertional dyspnea and fatigue. He was felt to be a good candidate for transcatheter edge-to-edge mitral repair although he was referred for formal  surgical consultation as part of a multidisciplinary team approach to his care. He saw Dr. Cliffton Asters 04/26/21 and was felt to be high risk for re-do sternotomy therefore felt better served with MitraClip placement.    He presented to [facility] 05/09/21 for scheduled TEER with successful transcatheter mitral edge to edge repair with one XTW clip positioned on A2/P2 reducing severe degenerative nonrheumatic mitral regurgitation to mild to moderate mitral regurgitation. Post clip recommendations were to restart home anticoagulation at prior dose. He remained rather bradycardic however was asymptomatic therefore his Toprol was reduced to 25mg  QD from 50mg  QD.    Today he is here alone and reports that he is doing fantastic since his procedure. He is now playing golf three times per week with no issues. Denies SOB, LE edema, palpitations, orthopnea, dizziness, or syncope.     Past Medical History:  Diagnosis Date   CAD (coronary artery disease)    with Cabg 2008   Claudication (HCC)    Diabetes (HCC)    HTN (hypertension)    Hypercholesteremia    Hyperlipidemia    S/P mitral valve clip implantation 05/09/2021   one XTW clip positioned on A2/P2 per Dr. 2009    Past Surgical History:  Procedure Laterality Date   ABDOMINAL AORTOGRAM W/LOWER EXTREMITY N/A 09/22/2018   Procedure: ABDOMINAL AORTOGRAM W/LOWER EXTREMITY;  Surgeon: Jorge Ny, MD;  [facility]: [facility] INVASIVE CV LAB;  Service: Cardiovascular;  Laterality: N/A;  CARDIAC CATHETERIZATION  04/03/2021   CARDIOVERSION N/A 02/26/2021   Procedure: CARDIOVERSION;  Surgeon: Nahser, Deloris Ping, MD;  Location: [facility] ENDOSCOPY;  Service: Cardiovascular;  Laterality: N/A;   CORONARY ARTERY BYPASS GRAFT  2008   Coronary bypass surgery     2008   EYE SURGERY Bilateral    laser surgery by Dr. Jerolyn Center   MITRAL VALVE REPAIR N/A 05/09/2021   Procedure: MITRAL VALVE REPAIR;  Surgeon: Orbie Pyo, MD;  Location: [facility] INVASIVE CV LAB;  Service:  Cardiovascular;  Laterality: N/A;   RIGHT/LEFT HEART CATH AND CORONARY/GRAFT ANGIOGRAPHY N/A 04/03/2021   Procedure: RIGHT/LEFT HEART CATH AND CORONARY/GRAFT ANGIOGRAPHY;  Surgeon: Lyn Records, MD;  Location: [facility] INVASIVE CV LAB;  Service: Cardiovascular;  Laterality: N/A;   TEE WITHOUT CARDIOVERSION N/A 02/26/2021   Procedure: TRANSESOPHAGEAL ECHOCARDIOGRAM (TEE);  Surgeon: Elease Hashimoto Deloris Ping, MD;  Location: [facility] ENDOSCOPY;  Service: Cardiovascular;  Laterality: N/A;   TEE WITHOUT CARDIOVERSION N/A 05/09/2021   Procedure: TRANSESOPHAGEAL ECHOCARDIOGRAM (TEE);  Surgeon: Orbie Pyo, MD;  Location: [facility] INVASIVE CV LAB;  Service: Cardiovascular;  Laterality: N/A;    Current Medications: No outpatient medications have been marked as taking for the 06/10/21 encounter (Office Visit) with Filbert Schilder, NP.     Allergies:   Patient has no known allergies.   Social History   Socioeconomic History   Marital status: Widowed    Spouse name: Not on file   Number of children: Not on file   Years of education: Not on file   Highest education level: Not on file  Occupational History   Not on file  Tobacco Use   Smoking status: Former   Smokeless tobacco: Never  Vaping Use   Vaping Use: Never used  Substance and Sexual Activity   Alcohol use: Not Currently    Alcohol/week: 1.0 standard drink    Types: 1 Glasses of wine per week   Drug use: No   Sexual activity: Yes  Other Topics Concern   Not on file  Social History Narrative   Not on file   Social Determinants of Health   Financial Resource Strain: Not on file  Food Insecurity: Not on file  Transportation Needs: Not on file  Physical Activity: Not on file  Stress: Not on file  Social Connections: Not on file     Family History: The patient's family history includes Healthy in his sister, sister, and sister; Heart attack in his maternal grandfather; Heart disease in his father.  ROS:   Please see the history of present  illness.    All other systems reviewed and are negative.  EKGs/Labs/Other Studies Reviewed:    The following studies were reviewed today:  Echocardiogram 05/10/21:   1. The mitral valve has been repaired with TEER approach: Am XTW Mitra  clip was placed in the A2-P2 position. The is mild, +1 mitral  regurgitation seen as two jets at the A1-P1 and A2-P2 position. Procedure  Date 05/09/21. Double orifice morphology seen  in short axis.      The mean mitral valve gradient is 3.0 mmHg with average heart rate of  56 bpm. There is residual blunting of the pulmonary veins.   2. Left ventricular ejection fraction, by estimation, is 60 to 65%. The  left ventricle has normal function. The left ventricle has no regional  wall motion abnormalities.   3. Right ventricular systolic function is normal. The right ventricular  size is normal. There is moderately elevated pulmonary artery systolic  pressure. The estimated right ventricular systolic pressure is 56.2 mmHg.   4. Left atrial size was severely dilated.   5. Tricuspid valve regurgitation is moderate.   6. The aortic valve is tricuspid. Aortic valve regurgitation is not  visualized. No aortic stenosis is present.   7. The inferior vena cava is dilated in size with <50% respiratory  variability, suggesting right atrial pressure of 15 mmHg.   Comparison(s): A prior study was performed on 05/09/21. Improvement in  mitral regurgitation from post procedural imaging at different loading  conditions (improved blood pressure).    TEER 05/09/21:   Successful transcatheter mitral edge to edge repair with one XTW clip positioned on A2/P2 reducing severe degenerative nonrheumatic mitral regurgitation to mild to moderate mitral regurgitation.   RECOMMENDATIONS:  Admit for observation, restart home anticoagulation, and obtain limited echocardiogram tomorrow.  EKG:  EKG is not ordered today.    Recent Labs: 02/24/2021: B Natriuretic Peptide  1,394.6 02/25/2021: TSH 1.897 05/06/2021: ALT 20 05/10/2021: BUN 15; Creatinine, Ser 1.18; Hemoglobin 11.2; Platelets 140; Potassium 4.2; Sodium 137  Recent Lipid Panel    Component Value Date/Time   CHOL 130 02/25/2021 0249   CHOL 86 (L) 10/10/2019 0744   TRIG 102 02/25/2021 0249   HDL 35 (L) 02/25/2021 0249   HDL 44 10/10/2019 0744   CHOLHDL 3.7 02/25/2021 0249   VLDL 20 02/25/2021 0249   LDLCALC 75 02/25/2021 0249   LDLCALC 24 10/10/2019 0744   Physical Exam:    VS:  BP 126/78   Pulse (!) 57   Ht 5\' 8"  (1.727 m)   Wt 141 lb (64 kg)   SpO2 96%   BMI 21.44 kg/m     Wt Readings from Last 3 Encounters:  06/10/21 141 lb (64 kg)  05/16/21 147 lb 9.6 oz (67 kg)  05/09/21 141 lb (64 kg)    General: Well developed, well nourished, NAD Lungs:Clear to ausculation bilaterally. No wheezes, rales, or rhonchi. Breathing is unlabored. Cardiovascular: RRR with S1 S2. No murmur Extremities: No edema. Neuro: Alert and oriented. No focal deficits. No facial asymmetry. MAE spontaneously. Psych: Responds to questions appropriately with normal affect.    ASSESSMENT/PLAN:    1. Severe MR s/p MitraClip: Successful TEER repair with one XTW clip positioned on A2/P2 reducing severe degenerative nonrheumatic mitral regurgitation to mild to moderate mitral regurgitation. Post clip recommendations were to restart home anticoagulation at prior dose>>continue Eliquis. He has NYHA class II symptoms>>excellent. SBE discussed and sent to preferred pharmacy, Amoxicillin 2g PO 1 hour prior to dental procedures.   2. CAD s/p CABG 2007: Doing well with no anginal symptoms. Continue ASA, statin, and beta blocker    3. DM2: Continue current regimen    4. Persistent atrial fibrillation: Continue on Eliquis   Medication Adjustments/Labs and Tests Ordered: Current medicines are reviewed at length with the patient today.  Concerns regarding medicines are outlined above.  No orders of the defined types were  placed in this encounter.  No orders of the defined types were placed in this encounter.   Patient Instructions  Medication Instructions:  Your physician recommends that you continue on your current medications as directed. Please refer to the Current Medication list given to you today.  *If you need a refill on your cardiac medications before your next appointment, please call your pharmacy*   Lab Work: NONE If you have labs (blood work) drawn today and your tests are completely normal, you will receive your results only by: MyChart  Message (if you have MyChart) OR A paper copy in the mail If you have any lab test that is abnormal or we need to change your treatment, we will call you to review the results.   Testing/Procedures: NONE   Follow-Up: At [facility], you and your health needs are our priority.  As part of our continuing mission to provide you with exceptional heart care, we have created designated Provider Care Teams.  These Care Teams include your primary Cardiologist (physician) and Advanced Practice Providers (APPs -  Physician Assistants and Nurse Practitioners) who all work together to provide you with the care you need, when you need it.  We recommend signing up for the patient portal called "MyChart".  Sign up information is provided on this After Visit Summary.  MyChart is used to connect with patients for Virtual Visits (Telemedicine).  Patients are able to view lab/test results, encounter notes, upcoming appointments, etc.  Non-urgent messages can be sent to your provider as well.   To learn more about what you can do with MyChart, go to [email or web address].    Your next appointment:   5 month(s)  The format for your next appointment:   In Person  Provider:   Lesleigh Noe, MD {  Your physician recommends that you schedule a follow-up appointment in: 1 YEAR WITH Aariona Momon, NP     Signed, Georgie Chard, NP  06/10/2021 1:33 PM    [facility]

## 2021-06-08 ENCOUNTER — Other Ambulatory Visit: Payer: Self-pay | Admitting: Student

## 2021-06-10 ENCOUNTER — Encounter: Payer: Self-pay | Admitting: Cardiology

## 2021-06-10 ENCOUNTER — Other Ambulatory Visit: Payer: Self-pay

## 2021-06-10 ENCOUNTER — Ambulatory Visit (INDEPENDENT_AMBULATORY_CARE_PROVIDER_SITE_OTHER): Payer: Medicare Other | Admitting: Cardiology

## 2021-06-10 ENCOUNTER — Ambulatory Visit (HOSPITAL_COMMUNITY): Payer: Medicare Other | Attending: Internal Medicine

## 2021-06-10 VITALS — BP 126/78 | HR 57 | Ht 68.0 in | Wt 141.0 lb

## 2021-06-10 DIAGNOSIS — I340 Nonrheumatic mitral (valve) insufficiency: Secondary | ICD-10-CM

## 2021-06-10 DIAGNOSIS — Z95818 Presence of other cardiac implants and grafts: Secondary | ICD-10-CM | POA: Insufficient documentation

## 2021-06-10 DIAGNOSIS — Z9889 Other specified postprocedural states: Secondary | ICD-10-CM

## 2021-06-10 DIAGNOSIS — I10 Essential (primary) hypertension: Secondary | ICD-10-CM

## 2021-06-10 DIAGNOSIS — I4819 Other persistent atrial fibrillation: Secondary | ICD-10-CM | POA: Diagnosis not present

## 2021-06-10 LAB — ECHOCARDIOGRAM COMPLETE
Area-P 1/2: 3.09 cm2
MV VTI: 0.91 cm2
S' Lateral: 3 cm

## 2021-06-10 MED ORDER — APIXABAN 5 MG PO TABS: 5.0000 mg | ORAL_TABLET | Freq: Two times a day (BID) | ORAL | 2 refills | Status: DC

## 2021-06-10 NOTE — Patient Instructions (Signed)
Medication Instructions:  Your physician recommends that you continue on your current medications as directed. Please refer to the Current Medication list given to you today.  *If you need a refill on your cardiac medications before your next appointment, please call your pharmacy*   Lab Work: NONE If you have labs (blood work) drawn today and your tests are completely normal, you will receive your results only by: MyChart Message (if you have MyChart) OR A paper copy in the mail If you have any lab test that is abnormal or we need to change your treatment, we will call you to review the results.   Testing/Procedures: NONE   Follow-Up: At [facility], you and your health needs are our priority.  As part of our continuing mission to provide you with exceptional heart care, we have created designated Provider Care Teams.  These Care Teams include your primary Cardiologist (physician) and Advanced Practice Providers (APPs -  Physician Assistants and Nurse Practitioners) who all work together to provide you with the care you need, when you need it.  We recommend signing up for the patient portal called "MyChart".  Sign up information is provided on this After Visit Summary.  MyChart is used to connect with patients for Virtual Visits (Telemedicine).  Patients are able to view lab/test results, encounter notes, upcoming appointments, etc.  Non-urgent messages can be sent to your provider as well.   To learn more about what you can do with MyChart, go to [email or web address].    Your next appointment:   5 month(s)  The format for your next appointment:   In Person  Provider:   Lesleigh Noe, MD {  Your physician recommends that you schedule a follow-up appointment in: 1 YEAR WITH JILL MCDANIEL, NP

## 2021-06-25 ENCOUNTER — Encounter (INDEPENDENT_AMBULATORY_CARE_PROVIDER_SITE_OTHER): Payer: Medicare Other | Admitting: Ophthalmology

## 2021-06-25 ENCOUNTER — Other Ambulatory Visit: Payer: Self-pay

## 2021-06-25 DIAGNOSIS — H43813 Vitreous degeneration, bilateral: Secondary | ICD-10-CM

## 2021-06-25 DIAGNOSIS — H353231 Exudative age-related macular degeneration, bilateral, with active choroidal neovascularization: Secondary | ICD-10-CM

## 2021-06-25 DIAGNOSIS — H35033 Hypertensive retinopathy, bilateral: Secondary | ICD-10-CM

## 2021-06-25 DIAGNOSIS — I10 Essential (primary) hypertension: Secondary | ICD-10-CM

## 2021-06-26 ENCOUNTER — Encounter (INDEPENDENT_AMBULATORY_CARE_PROVIDER_SITE_OTHER): Payer: Medicare Other | Admitting: Ophthalmology

## 2021-06-26 DIAGNOSIS — H353211 Exudative age-related macular degeneration, right eye, with active choroidal neovascularization: Secondary | ICD-10-CM | POA: Diagnosis not present

## 2021-07-03 ENCOUNTER — Other Ambulatory Visit: Payer: Self-pay | Admitting: Student

## 2021-07-03 ENCOUNTER — Telehealth: Payer: Self-pay

## 2021-07-03 MED ORDER — APIXABAN 5 MG PO TABS: 5.0000 mg | ORAL_TABLET | Freq: Two times a day (BID) | ORAL | 1 refills | Status: DC

## 2021-07-03 NOTE — Telephone Encounter (Signed)
The patient Micheal [name] refills. Per protocol, will send to PharmD clinic to fill.

## 2021-07-03 NOTE — Telephone Encounter (Signed)
Prescription refill request for Eliquis received. Indication: afib  Last office visit: 06/10/2021 Scr: 1.18, 05/10/2021 Age: 80 yo  Weight: 64 kg   Pt is on the correct dose of Eliquis. Refill sent.

## 2021-07-05 ENCOUNTER — Ambulatory Visit: Payer: Medicare Other | Admitting: Interventional Cardiology

## 2021-07-09 ENCOUNTER — Other Ambulatory Visit: Payer: Self-pay

## 2021-07-09 MED ORDER — AMOXICILLIN 500 MG PO TABS: ORAL_TABLET | ORAL | 1 refills | Status: DC

## 2021-07-20 ENCOUNTER — Other Ambulatory Visit: Payer: Self-pay | Admitting: Cardiovascular Disease

## 2021-07-23 NOTE — Telephone Encounter (Signed)
Refill request

## 2021-08-06 ENCOUNTER — Other Ambulatory Visit: Payer: Self-pay

## 2021-08-06 ENCOUNTER — Encounter: Payer: Self-pay | Admitting: Cardiovascular Disease

## 2021-08-06 ENCOUNTER — Ambulatory Visit (INDEPENDENT_AMBULATORY_CARE_PROVIDER_SITE_OTHER): Payer: Medicare Other | Admitting: Cardiovascular Disease

## 2021-08-06 VITALS — BP 124/78 | HR 73 | Ht 68.0 in | Wt 144.0 lb

## 2021-08-06 DIAGNOSIS — R0602 Shortness of breath: Secondary | ICD-10-CM

## 2021-08-06 DIAGNOSIS — R079 Chest pain, unspecified: Secondary | ICD-10-CM | POA: Diagnosis not present

## 2021-08-06 DIAGNOSIS — I739 Peripheral vascular disease, unspecified: Secondary | ICD-10-CM

## 2021-08-06 DIAGNOSIS — I340 Nonrheumatic mitral (valve) insufficiency: Secondary | ICD-10-CM | POA: Diagnosis not present

## 2021-08-06 DIAGNOSIS — E785 Hyperlipidemia, unspecified: Secondary | ICD-10-CM | POA: Diagnosis not present

## 2021-08-06 DIAGNOSIS — I10 Essential (primary) hypertension: Secondary | ICD-10-CM | POA: Diagnosis not present

## 2021-08-06 LAB — SPECIMEN STATUS REPORT

## 2021-08-06 MED ORDER — FUROSEMIDE 20 MG PO TABS: 20.0000 mg | ORAL_TABLET | Freq: Every day | ORAL | 4 refills | Status: DC

## 2021-08-06 MED ORDER — POTASSIUM CHLORIDE CRYS ER 20 MEQ PO TBCR: 20.0000 meq | EXTENDED_RELEASE_TABLET | Freq: Every day | ORAL | 4 refills | Status: DC

## 2021-08-06 NOTE — Progress Notes (Signed)
Cardiology Office Note   Date:  08/06/2021   ID:  DIANA ARMIJO, DOB [birth year], MRN [id number]  PCP:  Lorenda Ishihara, MD  Cardiologist:  Dr. Katrinka Blazing   Chief Complaint  Patient presents with   Follow-up    12 months.   Chest Pain    Here in office.   Shortness of Breath      History of Present Illness: KAPONO LUHN is a 81 y.o. male who is here today for follow-up visit regarding peripheral arterial disease.   Patient has known history of coronary artery disease status post CABG in 2008, type 2 diabetes, hypertension and hyperlipidemia.  He is a previous smoker and quit in 2008.  He has no family history of coronary artery disease. 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 He was seen in 2020 for bilateral leg pain that starts in the thigh area and goes down.  He underwent lower extremity arterial Doppler which showed an ABI of 1.07 on the right and 0.84 on the left.  Given significant limitations with symptoms, I proceeded with angiography and February 2020  which showed no significant aortoiliac disease.  On the right side, there was mild SFA disease with two-vessel runoff below the knee.  On the left side, there was mild SFA disease with one-vessel runoff below the knee via the peroneal artery.  These findings could not explain his symptoms especially that he does not complain of calf or foot claudication. He was diagnosed with atrial fibrillation in July 2022 with subsequent cardioversion.  He was found to have severe mitral regurgitation and subsequently underwent mitral valve clip in October. He reports significant improvement in his shortness of breath after mitral valve clip and he was able to resume playing golf with no physical limitations.  He has chronic bilateral thigh pain with exertion but no calf claudication.  Over the last week, he noticed significant change in symptoms including shortness of breath with  minimal exertion.  He has not been able to play golf.  In addition, he reports substernal chest tightness that has been present for the last few days.   Past Medical History:  Diagnosis Date   CAD (coronary artery disease)    with Cabg 2008   Claudication (HCC)    Diabetes (HCC)    HTN (hypertension)    Hypercholesteremia    Hyperlipidemia    S/P mitral valve clip implantation 05/09/2021   one XTW clip positioned on A2/P2 per Dr. Jorge Ny    Past Surgical History:  Procedure Laterality Date   ABDOMINAL AORTOGRAM W/LOWER EXTREMITY N/A 09/22/2018   Procedure: ABDOMINAL AORTOGRAM W/LOWER EXTREMITY;  Surgeon: Iran Ouch, MD;  Location: [facility] INVASIVE CV LAB;  Service: Cardiovascular;  Laterality: N/A;   CARDIAC CATHETERIZATION  04/03/2021   CARDIOVERSION N/A 02/26/2021   Procedure: CARDIOVERSION;  Surgeon: Elease Hashimoto Deloris Ping, MD;  Location: [facility] ENDOSCOPY;  Service: Cardiovascular;  Laterality: N/A;   CORONARY ARTERY BYPASS GRAFT  2008   Coronary bypass surgery     2008   EYE SURGERY Bilateral    laser surgery by Dr. Jerolyn Center   MITRAL VALVE REPAIR N/A 05/09/2021   Procedure: MITRAL VALVE REPAIR;  Surgeon: Orbie Pyo, MD;  Location: [facility] INVASIVE CV LAB;  Service: Cardiovascular;  Laterality: N/A;   RIGHT/LEFT HEART CATH AND CORONARY/GRAFT ANGIOGRAPHY N/A 04/03/2021   Procedure: RIGHT/LEFT HEART CATH AND CORONARY/GRAFT ANGIOGRAPHY;  Surgeon: Verdis Prime  W, MD;  Location: [facility] INVASIVE CV LAB;  Service: Cardiovascular;  Laterality: N/A;   TEE WITHOUT CARDIOVERSION N/A 02/26/2021   Procedure: TRANSESOPHAGEAL ECHOCARDIOGRAM (TEE);  Surgeon: Elease HashimotoNahser, Deloris PingPhilip J, MD;  [facility];  Service: Cardiovascular;  Laterality: N/A;   TEE WITHOUT CARDIOVERSION N/A 05/09/2021   Procedure: TRANSESOPHAGEAL ECHOCARDIOGRAM (TEE);  Surgeon: Orbie Pyohukkani, Arun K, MD;  [facility];  Service: Cardiovascular;  Laterality: N/A;     Current Outpatient Medications   Medication Sig Dispense Refill   amoxicillin (AMOXIL) 500 MG tablet Take 4 tablets one hour prior to dental appointment 8 tablet 1   apixaban (ELIQUIS) 5 MG TABS tablet Take 1 tablet (5 mg total) by mouth 2 (two) times daily. 180 tablet 1   Cholecalciferol (VITAMIN D) 50 MCG (2000 UT) tablet Take 2,000 Units by mouth in the morning and at bedtime.     CINNAMON PO Take 1,000 mg by mouth daily.     Coenzyme Q10 (CO Q-10) 200 MG CAPS Take 200 mg by mouth daily.      dorzolamide-timolol (COSOPT) 22.3-6.8 MG/ML ophthalmic solution Place 1 drop into both eyes 2 (two) times daily.     Glucosamine-Chondroitin (GLUCOSAMINE CHONDR COMPLEX PO) Take 1 tablet by mouth daily.     isosorbide mononitrate (IMDUR) 30 MG 24 hr tablet Take 1 tablet (30 mg total) by mouth daily. 30 tablet 11   levOCARNitine (L-CARNITINE) 500 MG TABS Take 500 mg by mouth in the morning.     losartan-hydrochlorothiazide (HYZAAR) 50-12.5 MG tablet TAKE 1 TABLET BY MOUTH EVERY DAY 90 tablet 3   metFORMIN (GLUCOPHAGE) 1000 MG tablet Take 1,000 mg by mouth at bedtime.      metoprolol succinate (TOPROL-XL) 25 MG 24 hr tablet Take 1 tablet (25 mg total) by mouth daily. 60 tablet 2   Misc Natural Products (NF FORMULAS TESTOSTERONE) CAPS Take 1 capsule by mouth in the morning.     Multiple Vitamins-Minerals (PRESERVISION AREDS 2 PO) Take 1 capsule by mouth 2 (two) times daily.     Omega-3 Fatty Acids (FISH OIL PO) Take 1 capsule by mouth daily.     pravastatin (PRAVACHOL) 20 MG tablet Take 20 mg by mouth at bedtime.     Propylene Glycol (SYSTANE BALANCE) 0.6 % SOLN Place 1 drop into both eyes daily as needed (dry eyes).     QUERCETIN PO Take 1 tablet by mouth daily.     vitamin B-12 (CYANOCOBALAMIN) 1000 MCG tablet Take 1,000 mcg by mouth daily.     vitamin E 400 UNIT capsule Take 400 Units by mouth daily.     VYZULTA 0.024 % SOLN Place 1 drop into the right eye at bedtime.     No current facility-administered medications for this visit.     Allergies:   Patient has no known allergies.    Social History:  The patient  reports that he has quit smoking. He has never used smokeless tobacco. He reports that he does not currently use alcohol after a past usage of about 1.0 standard drink per week. He reports that he does not use drugs.   Family History:  The patient's family history includes Healthy in his sister, sister, and sister; Heart attack in his maternal grandfather; Heart disease in his father.    ROS:  Please see the history of present illness.   Otherwise, review of systems are positive for none.   All other systems are reviewed and negative.    PHYSICAL EXAM: VS:  BP 124/78 (BP Location: Right Arm, Patient Position: Sitting, Cuff Size: Normal)    Pulse 73    Ht 5\' 8"  (1.727 m)    Wt 144 lb (65.3 kg)    BMI 21.90 kg/m  , BMI Body mass index is 21.9 kg/m. GEN: Well nourished, well developed, in no acute distress  HEENT: normal  Neck: no JVD, carotid bruits, or masses Cardiac: RRR; no rubs, or gallops,no edema .  2 out of 6 holosystolic murmur at the apex. Respiratory:  clear to auscultation bilaterally, normal work of breathing GI: soft, nontender, nondistended, + BS MS: no deformity or atrophy  Skin: warm and dry, no rash Neuro:  Strength and sensation are intact Psych: euthymic mood, full affect Vascular: Femoral pulse: Normal bilaterally.  Distal pulses are not palpable.   EKG:  EKG  ordered today. EKG showed atrial fibrillation with lateral T wave changes suggestive of ischemia.   Recent Labs: 02/24/2021: B Natriuretic Peptide 1,394.6 02/25/2021: TSH 1.897 05/06/2021: ALT 20 05/10/2021: BUN 15; Creatinine, Ser 1.18; Hemoglobin 11.2; Platelets 140; Potassium 4.2; Sodium 137    Lipid Panel    Component Value Date/Time   CHOL 130 02/25/2021 0249   CHOL 86 (L) 10/10/2019 0744   TRIG 102 02/25/2021 0249   HDL 35 (L) 02/25/2021 0249   HDL 44 10/10/2019 0744   CHOLHDL 3.7 02/25/2021 0249   VLDL 20  02/25/2021 0249   LDLCALC 75 02/25/2021 0249   LDLCALC 24 10/10/2019 0744      Wt Readings from Last 3 Encounters:  08/06/21 144 lb (65.3 kg)  06/10/21 141 lb (64 kg)  05/16/21 147 lb 9.6 oz (67 kg)      No flowsheet data found.    ASSESSMENT AND PLAN:  1.  Peripheral arterial disease with atypical leg pain: The patient still reports mild thigh discomfort with walking with no calf claudication.  His femoral pulses are close to normal bilaterally and there is no evidence of inflow disease to explain his symptoms.  Recommend continuing medical therapy.  2.  Coronary artery disease involving native coronary arteries with other forms of angina: He reports chest tightness and shortness of breath over the last few days.  Will check BNP and troponin.  3.  Hyperlipidemia: Continue treatment with a statin with a target LDL of less than 70.  4.  Essential hypertension: Blood pressure is controlled.  5.  Status post mitral valve clip for severe mitral regurgitation, the patient reports feeling great up until last week when he had recurrent shortness of breath and chest pain.  He does have a mitral regurgitation murmur by exam.  I requested a follow-up echocardiogram and will have him follow-up with Dr. 05/18/21. Most recent echocardiogram did show evidence of significant pulmonary hypertension and thus I elected to start the patient on small dose furosemide 20 mg once daily with potassium chloride 20 mEq once daily.  We will check routine labs today.  5.  Atrial fibrillation: The patient has been in atrial fibrillation since August of last year according the EKGs.  Not entirely sure if this is contributing to his symptoms but his rate seems to be controlled.  Continue anticoagulation with Eliquis.    Disposition: Obtain labs today, requested an echocardiogram with plans to follow-up with Dr. September after.  FU with me in 1 year  Signed,  Excell Seltzer, MD  08/06/2021 8:53 AM    [facility]

## 2021-08-06 NOTE — Patient Instructions (Signed)
Medication Instructions:  START Furosemide 20 mg once daily START Potassium 20 mEq once daily  *If you need a refill on your cardiac medications before your next appointment, please call your pharmacy*   Lab Work: Your provider would like for you to have the following labs today: Troponin, CBC, BMET, and BNP  If you have labs (blood work) drawn today and your tests are completely normal, you will receive your results only by: [place] (if you have MyChart) OR A paper copy in the mail If you have any lab test that is abnormal or we need to change your treatment, we will call you to review the results.   Testing/Procedures: Your physician has requested that you have an echocardiogram. Echocardiography is a painless test that uses sound waves to create images of your heart. It provides your doctor with information about the size and shape of your heart and how well your hearts chambers and valves are working. You may receive an ultrasound enhancing agent through an IV if needed to better visualize your heart during the echo.This procedure takes approximately one hour. There are no restrictions for this procedure. This will take place at the [address], [address].     Follow-Up: At [facility], you and your health needs are our priority.  As part of our continuing mission to provide you with exceptional heart care, we have created designated Provider Care Teams.  These Care Teams include your primary Cardiologist (physician) and Advanced Practice Providers (APPs -  Physician Assistants and Nurse Practitioners) who all work together to provide you with the care you need, when you need it.  We recommend signing up for the patient portal called "MyChart".  Sign up information is provided on this After Visit Summary.  MyChart is used to connect with patients for Virtual Visits (Telemedicine).  Patients are able to view lab/test results, encounter notes, upcoming appointments, etc.   Non-urgent messages can be sent to your provider as well.   To learn more about what you can do with MyChart, go to [email or web address].    Your next appointment:   12 month(s)  The format for your next appointment:   In Person  Provider:   Kathlyn Sacramento, MD  Follow up with Dr. Burt Knack after the [facility]

## 2021-08-07 LAB — BASIC METABOLIC PANEL
BUN/Creatinine Ratio: 14 (ref 10–24)
BUN: 15 mg/dL (ref 8–27)
CO2: 22 mmol/L (ref 20–29)
Calcium: 9.1 mg/dL (ref 8.6–10.2)
Chloride: 102 mmol/L (ref 96–106)
Creatinine, Ser: 1.09 mg/dL (ref 0.76–1.27)
Glucose: 193 mg/dL — ABNORMAL HIGH (ref 70–99)
Potassium: 4.5 mmol/L (ref 3.5–5.2)
Sodium: 140 mmol/L (ref 134–144)
eGFR: 68 mL/min/{1.73_m2} (ref >59)

## 2021-08-07 LAB — CBC
Hematocrit: 38.3 % (ref 37.5–51.0)
Hemoglobin: 12.8 g/dL — ABNORMAL LOW (ref 13.0–17.7)
MCH: 33.6 pg — ABNORMAL HIGH (ref 26.6–33.0)
MCHC: 33.4 g/dL (ref 31.5–35.7)
MCV: 101 fL — ABNORMAL HIGH (ref 79–97)
Platelets: 166 10*3/uL (ref 150–450)
RBC: 3.81 x10E6/uL — ABNORMAL LOW (ref 4.14–5.80)
RDW: 13.7 % (ref 11.6–15.4)
WBC: 6.4 10*3/uL (ref 3.4–10.8)

## 2021-08-07 LAB — BRAIN NATRIURETIC PEPTIDE: BNP: 199.1 pg/mL — ABNORMAL HIGH (ref 0.0–100.0)

## 2021-08-07 LAB — TROPONIN T: Troponin T (Highly Sensitive): 13 ng/L (ref 0–22)

## 2021-08-13 DIAGNOSIS — Z952 Presence of prosthetic heart valve: Secondary | ICD-10-CM | POA: Diagnosis not present

## 2021-08-13 DIAGNOSIS — I340 Nonrheumatic mitral (valve) insufficiency: Secondary | ICD-10-CM | POA: Diagnosis not present

## 2021-08-13 DIAGNOSIS — I509 Heart failure, unspecified: Secondary | ICD-10-CM | POA: Diagnosis not present

## 2021-08-13 DIAGNOSIS — I4891 Unspecified atrial fibrillation: Secondary | ICD-10-CM | POA: Diagnosis not present

## 2021-08-13 DIAGNOSIS — I10 Essential (primary) hypertension: Secondary | ICD-10-CM | POA: Diagnosis not present

## 2021-08-13 DIAGNOSIS — I25810 Atherosclerosis of coronary artery bypass graft(s) without angina pectoris: Secondary | ICD-10-CM | POA: Diagnosis not present

## 2021-08-19 ENCOUNTER — Encounter: Payer: Self-pay | Admitting: Cardiovascular Disease

## 2021-08-19 ENCOUNTER — Ambulatory Visit (INDEPENDENT_AMBULATORY_CARE_PROVIDER_SITE_OTHER): Payer: Medicare Other | Admitting: Cardiovascular Disease

## 2021-08-19 ENCOUNTER — Other Ambulatory Visit: Payer: Self-pay

## 2021-08-19 ENCOUNTER — Ambulatory Visit (HOSPITAL_COMMUNITY): Payer: Medicare Other | Attending: Internal Medicine

## 2021-08-19 VITALS — BP 122/60 | HR 73 | Ht 68.0 in | Wt 147.8 lb

## 2021-08-19 DIAGNOSIS — I4819 Other persistent atrial fibrillation: Secondary | ICD-10-CM | POA: Diagnosis not present

## 2021-08-19 DIAGNOSIS — I340 Nonrheumatic mitral (valve) insufficiency: Secondary | ICD-10-CM | POA: Diagnosis not present

## 2021-08-19 DIAGNOSIS — I2510 Atherosclerotic heart disease of native coronary artery without angina pectoris: Secondary | ICD-10-CM | POA: Diagnosis not present

## 2021-08-19 DIAGNOSIS — I5032 Chronic diastolic (congestive) heart failure: Secondary | ICD-10-CM

## 2021-08-19 LAB — ECHOCARDIOGRAM COMPLETE
MV M vel: 4.35 m/s
MV Peak grad: 75.8 mmHg
S' Lateral: 2.3 cm

## 2021-08-19 NOTE — Progress Notes (Signed)
Cardiology Office Note:    Date:  08/20/2021   ID:  Micheal [name], DOB 1941-05-16, MRN [id number]  PCP:  [name], MD   [facility] Providers Cardiologist:  Sinclair Grooms, MD     Referring MD: [name],*   Chief Complaint  Patient presents with   Shortness of Breath    History of Present Illness:    Micheal [name] is a 81 y.o. male with a hx of coronary artery disease status post CABG in 2007, type 2 diabetes, persistent atrial fibrillation, mitral regurgitation, and congestive heart failure, presenting for follow-up evaluation.  The patient underwent transcatheter edge-to-edge repair of the mitral valve in October 2022 for treatment of severe mitral regurgitation with bileaflet prolapse and myxomatous degeneration.  He initially did well after the procedure but later developed progressive shortness of breath.  He was seen by Dr. Fletcher Anon for evaluation of peripheral arterial disease and his biggest concern was his exertional dyspnea.  Since the patient was noted to have a murmur of mitral regurgitation, a repeat echocardiogram was ordered.  In addition, the patient was started on low-dose furosemide at that time.  The patient is here alone today.  He reports pretty marked improvement in his breathing since starting on low-dose furosemide and he noticed that improvement within a few days of starting this.  He no longer has any orthopnea, PND, leg swelling, or other complaints.  He has no chest pain or pressure.  He still is "aware" of his breathing, but states it is much better than it previously was.  He is playing golf 3 days/week.  Past Medical History:  Diagnosis Date   CAD (coronary artery disease)    with Cabg 2008   Claudication ([place])    Diabetes ([place])    HTN (hypertension)    Hypercholesteremia    Hyperlipidemia    S/P mitral valve clip implantation 05/09/2021   one XTW clip positioned on A2/P2 per Dr. [name]    Past Surgical  History:  Procedure Laterality Date   ABDOMINAL AORTOGRAM W/LOWER EXTREMITY N/A 09/22/2018   Procedure: ABDOMINAL AORTOGRAM W/LOWER EXTREMITY;  Surgeon: [name], MD;  Location: [place] [facility];  Service: Cardiovascular;  Laterality: N/A;   CARDIAC CATHETERIZATION  04/03/2021   CARDIOVERSION N/A 02/26/2021   Procedure: CARDIOVERSION;  Surgeon: [name], MD;  Location: [facility] ENDOSCOPY;  Service: Cardiovascular;  Laterality: N/A;   CORONARY ARTERY BYPASS GRAFT  2008   Coronary bypass surgery     2008   EYE SURGERY Bilateral    laser surgery by Dr. Rodena Piety   MITRAL VALVE REPAIR N/A 05/09/2021   Procedure: MITRAL VALVE REPAIR;  Surgeon: [name], MD;  Location: [place] [facility];  Service: Cardiovascular;  Laterality: N/A;   RIGHT/LEFT HEART CATH AND CORONARY/GRAFT ANGIOGRAPHY N/A 04/03/2021   Procedure: RIGHT/LEFT HEART CATH AND CORONARY/GRAFT ANGIOGRAPHY;  Surgeon: Belva Crome, MD;  Location: [place] [facility];  Service: Cardiovascular;  Laterality: N/A;   TEE WITHOUT CARDIOVERSION N/A 02/26/2021   Procedure: TRANSESOPHAGEAL ECHOCARDIOGRAM (TEE);  Surgeon: [name], MD;  Location: [facility] ENDOSCOPY;  Service: Cardiovascular;  Laterality: N/A;   TEE WITHOUT CARDIOVERSION N/A 05/09/2021   Procedure: TRANSESOPHAGEAL ECHOCARDIOGRAM (TEE);  Surgeon: [name], MD;  Location: [place] [facility];  Service: Cardiovascular;  Laterality: N/A;    Current Medications: Current Meds  Medication Sig   amoxicillin (AMOXIL) 500 MG tablet Take 4 tablets one hour prior to dental appointment  apixaban (ELIQUIS) 5 MG TABS tablet Take 1 tablet (5 mg total) by mouth 2 (two) times daily.   Cholecalciferol (VITAMIN D) 50 MCG (2000 UT) tablet Take 2,000 Units by mouth in the morning and at bedtime.   CINNAMON PO Take 1,000 mg by mouth daily.   Coenzyme Q10 (CO Q-10) 200 MG CAPS Take 200 mg by mouth daily.    dorzolamide-timolol (COSOPT) 22.3-6.8 MG/ML ophthalmic solution  Place 1 drop into both eyes 2 (two) times daily.   furosemide (LASIX) 20 MG tablet Take 1 tablet (20 mg total) by mouth daily.   Glucosamine-Chondroitin (GLUCOSAMINE CHONDR COMPLEX PO) Take 1 tablet by mouth daily.   isosorbide mononitrate (IMDUR) 30 MG 24 hr tablet Take 1 tablet (30 mg total) by mouth daily.   levOCARNitine (L-CARNITINE) 500 MG TABS Take 500 mg by mouth in the morning.   losartan-hydrochlorothiazide (HYZAAR) 50-12.5 MG tablet TAKE 1 TABLET BY MOUTH EVERY DAY   metFORMIN (GLUCOPHAGE) 1000 MG tablet Take 1,000 mg by mouth at bedtime.    metoprolol succinate (TOPROL-XL) 25 MG 24 hr tablet Take 1 tablet (25 mg total) by mouth daily.   Misc Natural Products (NF FORMULAS TESTOSTERONE) CAPS Take 1 capsule by mouth in the morning.   Multiple Vitamins-Minerals (PRESERVISION AREDS 2 PO) Take 1 capsule by mouth 2 (two) times daily.   Omega-3 Fatty Acids (FISH OIL PO) Take 1 capsule by mouth daily.   potassium chloride SA (KLOR-CON M20) 20 MEQ tablet Take 1 tablet (20 mEq total) by mouth daily.   pravastatin (PRAVACHOL) 20 MG tablet Take 20 mg by mouth at bedtime.   Propylene Glycol (SYSTANE BALANCE) 0.6 % SOLN Place 1 drop into both eyes daily as needed (dry eyes).   QUERCETIN PO Take 1 tablet by mouth daily.   vitamin B-12 (CYANOCOBALAMIN) 1000 MCG tablet Take 1,000 mcg by mouth daily.   vitamin E 400 UNIT capsule Take 400 Units by mouth daily.   VYZULTA 0.024 % SOLN Place 1 drop into the right eye at bedtime.     Allergies:   Patient has no known allergies.   Social History   Socioeconomic History   Marital status: Widowed    Spouse name: Not on file   Number of children: Not on file   Years of education: Not on file   Highest education level: Not on file  Occupational History   Not on file  Tobacco Use   Smoking status: Former   Smokeless tobacco: Never  Vaping Use   Vaping Use: Never used  Substance and Sexual Activity   Alcohol use: Not Currently    Alcohol/week:  1.0 standard drink    Types: 1 Glasses of wine per week   Drug use: No   Sexual activity: Yes  Other Topics Concern   Not on file  Social History Narrative   Not on file   Social Determinants of Health   Financial Resource Strain: Not on file  Food Insecurity: Not on file  Transportation Needs: Not on file  Physical Activity: Not on file  Stress: Not on file  Social Connections: Not on file     Family History: The patient's family history includes Healthy in his sister, sister, and sister; Heart attack in his maternal grandfather; Heart disease in his father.  ROS:   Please see the history of present illness.    All other systems reviewed and are negative.  EKGs/Labs/Other Studies Reviewed:    The following studies were reviewed today: Echo 08/19/2021:  1. Left ventricular ejection fraction, by estimation, is 55 to 60%. The  left ventricle has normal function. The left ventricle has no regional  wall motion abnormalities. There is mild left ventricular hypertrophy.  Left ventricular diastolic function  could not be evaluated.   2. Right ventricular systolic function is low normal. The right  ventricular size is mildly enlarged.   3. Left atrial size was severely dilated.   4. Right atrial size was moderately dilated.   5. S/p XTW clip (A2/P2) on 05/09/21. Peak and mean gradients through the  valve are 14 and 4 mm Hg respectively . The mitral valve has been  repaired/replaced. Mild to moderate mitral valve regurgitation.   6. Tricuspid valve regurgitation is mild to moderate.   7. The aortic valve is tricuspid. Aortic valve regurgitation is not  visualized. Aortic valve sclerosis is present, with no evidence of aortic  valve stenosis.   8. Pulmonic valve regurgitation is moderate to severe.   9. The inferior vena cava is normal in size with greater than 50%  respiratory variability, suggesting right atrial pressure of 3 mmHg.    Recent Labs: 02/25/2021: TSH  1.897 05/06/2021: ALT 20 08/06/2021: BNP 199.1; BUN 15; Creatinine, Ser 1.09; Hemoglobin 12.8; Platelets 166; Potassium 4.5; Sodium 140  Recent Lipid Panel    Component Value Date/Time   CHOL 130 02/25/2021 0249   CHOL 86 (L) 10/10/2019 0744   TRIG 102 02/25/2021 0249   HDL 35 (L) 02/25/2021 0249   HDL 44 10/10/2019 0744   CHOLHDL 3.7 02/25/2021 0249   VLDL 20 02/25/2021 0249   LDLCALC 75 02/25/2021 0249   LDLCALC 24 10/10/2019 0744     Risk Assessment/Calculations:    CHA2DS2-VASc Score = 6   This indicates a 9.7% annual risk of stroke. The patient's score is based upon: CHF History: 1 HTN History: 1 Diabetes History: 1 Stroke History: 0 Vascular Disease History: 1 Age Score: 2 Gender Score: 0          Physical Exam:    VS:  BP 122/60    Pulse 73    Ht 5\' 8"  (1.727 m)    Wt 147 lb 12.8 oz (67 kg)    SpO2 96%    BMI 22.47 kg/m     Wt Readings from Last 3 Encounters:  08/19/21 147 lb 12.8 oz (67 kg)  08/06/21 144 lb (65.3 kg)  06/10/21 141 lb (64 kg)     GEN:  Well nourished, well developed in no acute distress HEENT: Normal NECK: No JVD; No carotid bruits LYMPHATICS: No lymphadenopathy CARDIAC: RRR, 2/6 holosystolic murmur at the apex RESPIRATORY:  Clear to auscultation without rales, wheezing or rhonchi  ABDOMEN: Soft, non-tender, non-distended MUSCULOSKELETAL:  No edema; No deformity  SKIN: Warm and dry NEUROLOGIC:  Alert and oriented x 3 PSYCHIATRIC:  Normal affect   ASSESSMENT:    1. Nonrheumatic mitral (valve) insufficiency   2. Chronic diastolic heart failure (HCC)   3. Persistent atrial fibrillation ([place])   4. Coronary artery disease involving native coronary artery of native heart without angina pectoris    PLAN:    In order of problems listed above:  I personally reviewed the patient's echo images at the time of his evaluation.  LV function remains normal.  The MitraClip XT W device is in place with good leaflet insertion and only mild to  moderate residual mitral regurgitation.  He will have a repeat echocardiogram when he is out to 1 year from his  procedure (October 2023).  He should continue with SBE prophylaxis when indicated. New York Heart Association functional class II symptoms.  Improved with low-dose furosemide.  He will continue the same.  No changes are made in his medical regimen today. Heart rate is controlled on metoprolol succinate.  Patient is anticoagulated with apixaban with no bleeding problems. Doing well on current management.  The patient has follow-up with Dr. Tamala Julian scheduled in a few months.           Medication Adjustments/Labs and Tests Ordered: Current medicines are reviewed at length with the patient today.  Concerns regarding medicines are outlined above.  No orders of the defined types were placed in this encounter.  No orders of the defined types were placed in this encounter.   Patient Instructions  Medication Instructions:  Your physician recommends that you continue on your current medications as directed. Please refer to the Current Medication list given to you today.  *If you need a refill on your cardiac medications before your next appointment, please call your pharmacy*   Lab Work: NONE If you have labs (blood work) drawn today and your tests are completely normal, you will receive your results only by: [place] (if you have MyChart) OR A paper copy in the mail If you have any lab test that is abnormal or we need to change your treatment, we will call you to review the results.   Testing/Procedures: NONE   Follow-Up: At [facility], you and your health needs are our priority.  As part of our continuing mission to provide you with exceptional heart care, we have created designated Provider Care Teams.  These Care Teams include your primary Cardiologist (physician) and Advanced Practice Providers (APPs -  Physician Assistants and Nurse Practitioners) who all work  together to provide you with the care you need, when you need it.  We recommend signing up for the patient portal called "MyChart".  Sign up information is provided on this After Visit Summary.  MyChart is used to connect with patients for Virtual Visits (Telemedicine).  Patients are able to view lab/test results, encounter notes, upcoming appointments, etc.  Non-urgent messages can be sent to your provider as well.   To learn more about what you can do with MyChart, go to [email or web address].    Your next appointment:   Keep current follow-up with Dr Tamala Julian   Other Instructions Will see structural team in 1 year    Signed, [name], MD  08/20/2021 3:10 PM    [place]

## 2021-08-19 NOTE — Patient Instructions (Signed)
Medication Instructions:  Your physician recommends that you continue on your current medications as directed. Please refer to the Current Medication list given to you today.  *If you need a refill on your cardiac medications before your next appointment, please call your pharmacy*   Lab Work: NONE If you have labs (blood work) drawn today and your tests are completely normal, you will receive your results only by: MyChart [place] (if you have MyChart) OR A paper copy in the mail If you have any lab test that is abnormal or we need to change your treatment, we will call you to review the results.   Testing/Procedures: NONE   Follow-Up: At [facility], you and your health needs are our priority.  As part of our continuing mission to provide you with exceptional heart care, we have created designated Provider Care Teams.  These Care Teams include your primary Cardiologist (physician) and Advanced Practice Providers (APPs -  Physician Assistants and Nurse Practitioners) who all work together to provide you with the care you need, when you need it.  We recommend signing up for the patient portal called "MyChart".  Sign up information is provided on this After Visit Summary.  MyChart is used to connect with patients for Virtual Visits (Telemedicine).  Patients are able to view lab/test results, encounter notes, upcoming appointments, etc.  Non-urgent messages can be sent to your provider as well.   To learn more about what you can do with MyChart, go to [email or web address].    Your next appointment:   Keep current follow-up with Dr Katrinka Blazing   Other Instructions Will see structural team in 1 year

## 2021-08-20 ENCOUNTER — Encounter (INDEPENDENT_AMBULATORY_CARE_PROVIDER_SITE_OTHER): Payer: Medicare Other | Admitting: Ophthalmology

## 2021-08-20 DIAGNOSIS — H353231 Exudative age-related macular degeneration, bilateral, with active choroidal neovascularization: Secondary | ICD-10-CM | POA: Diagnosis not present

## 2021-08-20 DIAGNOSIS — I10 Essential (primary) hypertension: Secondary | ICD-10-CM

## 2021-08-20 DIAGNOSIS — H35033 Hypertensive retinopathy, bilateral: Secondary | ICD-10-CM | POA: Diagnosis not present

## 2021-08-20 DIAGNOSIS — H43813 Vitreous degeneration, bilateral: Secondary | ICD-10-CM | POA: Diagnosis not present

## 2021-08-21 ENCOUNTER — Other Ambulatory Visit: Payer: Self-pay

## 2021-08-21 ENCOUNTER — Encounter (INDEPENDENT_AMBULATORY_CARE_PROVIDER_SITE_OTHER): Payer: Medicare Other | Admitting: Ophthalmology

## 2021-08-21 DIAGNOSIS — H353211 Exudative age-related macular degeneration, right eye, with active choroidal neovascularization: Secondary | ICD-10-CM

## 2021-08-27 ENCOUNTER — Ambulatory Visit: Payer: Medicare Other | Admitting: Cardiovascular Disease

## 2021-10-15 ENCOUNTER — Encounter (INDEPENDENT_AMBULATORY_CARE_PROVIDER_SITE_OTHER): Payer: Medicare Other | Admitting: Ophthalmology

## 2021-10-15 ENCOUNTER — Other Ambulatory Visit: Payer: Self-pay

## 2021-10-15 DIAGNOSIS — H43813 Vitreous degeneration, bilateral: Secondary | ICD-10-CM

## 2021-10-15 DIAGNOSIS — H353231 Exudative age-related macular degeneration, bilateral, with active choroidal neovascularization: Secondary | ICD-10-CM | POA: Diagnosis not present

## 2021-10-15 DIAGNOSIS — H35033 Hypertensive retinopathy, bilateral: Secondary | ICD-10-CM | POA: Diagnosis not present

## 2021-10-15 DIAGNOSIS — I10 Essential (primary) hypertension: Secondary | ICD-10-CM

## 2021-10-16 ENCOUNTER — Encounter (INDEPENDENT_AMBULATORY_CARE_PROVIDER_SITE_OTHER): Payer: Medicare Other | Admitting: Ophthalmology

## 2021-10-16 DIAGNOSIS — H353211 Exudative age-related macular degeneration, right eye, with active choroidal neovascularization: Secondary | ICD-10-CM

## 2021-10-29 NOTE — Progress Notes (Signed)
?Cardiology Office Note:   ? ?Date:  10/30/2021  ? ?ID:  Micheal [name], DOB Feb 11, 1941, MRN [id number] ? ?PCP:  [name], MD  ?Cardiologist:  Sinclair Grooms, MD  ? ?Referring MD: [name],*  ? ?Chief Complaint  ?Patient presents with  ? Coronary Artery Disease  ? Cardiac Valve Problem  ?  He is status post MitraClip  ? ? ?History of Present Illness:   ? ?Micheal [name] is a 81 y.o. male with a hx of coronary artery disease status post CABG in 2007, type 2 diabetes, persistent atrial fibrillation, mitral regurgitation, and congestive heart failure, and TEER-trans aortic edge-to-edge repair) 05/09/2021. ? ?In March 01, 2021 he presented with atrial fibs, acute heart failure, and was found to have severely dilated left atrium and severe mitral regurgitation. ? ? ?Appetite is good.  Can gain weight.  Holding steady at around 148 to 150 pounds.  Used to weigh 170 to 180 pounds.  He denies dyspnea, orthopnea, and PND.  Plays golf 3 times per week.  Not having any significant difficulty getting around. ? ?Past Medical History:  ?Diagnosis Date  ? CAD (coronary artery disease)   ? with Cabg 2008  ? Claud[facility])   ? Diabetes ([place])   ? HTN (hypertension)   ? Hypercholesteremia   ? Hyperlipidemia   ? S/P mitral valve clip implantation 05/09/2021  ? one XTW clip positioned on A2/P2 per Dr. Jeronimo Greaves. Ali Lowe  ? ? ?Past Surgical History:  ?Procedure Laterality Date  ? ABDOMINAL AORTOGRAM W/LOWER EXTREMITY N/A 09/22/2018  ? Procedure: ABDOMINAL AORTOGRAM W/LOWER EXTREMITY;  Surgeon: [name], MD;  Location: [place] [facility];  Service: Cardiovascular;  Laterality: N/A;  ? CARDIAC CATHETERIZATION  04/03/2021  ? CARDIOVERSION N/A 02/26/2021  ? Procedure: CARDIOVERSION;  Surgeon: [name], MD;  Location: [place];  Service: Cardiovascular;  Laterality: N/A;  ? CORONARY ARTERY BYPASS GRAFT  2008  ? Coronary bypass surgery    ? 2008  ? EYE SURGERY Bilateral   ? laser surgery  by Dr. Rodena Piety  ? MITRAL VALVE REPAIR N/A 05/09/2021  ? Procedure: MITRAL VALVE REPAIR;  Surgeon: [name], MD;  Location: [place] [facility];  Service: Cardiovascular;  Laterality: N/A;  ? RIGHT/LEFT HEART CATH AND CORONARY/GRAFT ANGIOGRAPHY N/A 04/03/2021  ? Procedure: RIGHT/LEFT HEART CATH AND CORONARY/GRAFT ANGIOGRAPHY;  Surgeon: Belva Crome, MD;  Location: [place] [facility];  Service: Cardiovascular;  Laterality: N/A;  ? TEE WITHOUT CARDIOVERSION N/A 02/26/2021  ? Procedure: TRANSESOPHAGEAL ECHOCARDIOGRAM (TEE);  Surgeon: [name], MD;  Location: [place];  Service: Cardiovascular;  Laterality: N/A;  ? TEE WITHOUT CARDIOVERSION N/A 05/09/2021  ? Procedure: TRANSESOPHAGEAL ECHOCARDIOGRAM (TEE);  Surgeon: [name], MD;  Location: [place] [facility];  Service: Cardiovascular;  Laterality: N/A;  ? ? ?Current Medications: ?Current Meds  ?Medication Sig  ? amoxicillin (AMOXIL) 500 MG tablet Take 4 tablets one hour prior to dental appointment  ? apixaban (ELIQUIS) 5 MG TABS tablet Take 1 tablet (5 mg total) by mouth 2 (two) times daily.  ? Cholecalciferol (VITAMIN D) 50 MCG (2000 UT) tablet Take 2,000 Units by mouth in the morning and at bedtime.  ? CINNAMON PO Take 1,000 mg by mouth daily.  ? Coenzyme Q10 (CO Q-10) 200 MG CAPS Take 200 mg by mouth daily.   ? dorzolamide-timolol (COSOPT) 22.3-6.8 MG/ML ophthalmic solution Place 1 drop into both eyes 2 (two) times daily.  ? furosemide (LASIX) 20  MG tablet Take 1 tablet (20 mg total) by mouth daily.  ? Glucosamine-Chondroitin (GLUCOSAMINE CHONDR COMPLEX PO) Take 1 tablet by mouth daily.  ? isosorbide mononitrate (IMDUR) 30 MG 24 hr tablet Take 1 tablet (30 mg total) by mouth daily.  ? levOCARNitine (L-CARNITINE) 500 MG TABS Take 500 mg by mouth in the morning.  ? losartan-hydrochlorothiazide (HYZAAR) 50-12.5 MG tablet TAKE 1 TABLET BY MOUTH EVERY DAY  ? metFORMIN (GLUCOPHAGE) 1000 MG tablet Take 1,000 mg by mouth at bedtime.   ?  metoprolol succinate (TOPROL-XL) 25 MG 24 hr tablet Take 1 tablet (25 mg total) by mouth daily.  ? Misc Natural Products (NF FORMULAS TESTOSTERONE) CAPS Take 1 capsule by mouth in the morning.  ? Multiple Vitamins-Minerals (PRESERVISION AREDS 2 PO) Take 1 capsule by mouth 2 (two) times daily.  ? Omega-3 Fatty Acids (FISH OIL PO) Take 1 capsule by mouth daily.  ? potassium chloride SA (KLOR-CON M20) 20 MEQ tablet Take 1 tablet (20 mEq total) by mouth daily.  ? pravastatin (PRAVACHOL) 20 MG tablet Take 20 mg by mouth at bedtime.  ? PROLENSA 0.07 % SOLN Place 1 drop into both eyes daily.  ? Propylene Glycol (SYSTANE BALANCE) 0.6 % SOLN Place 1 drop into both eyes daily as needed (dry eyes).  ? QUERCETIN PO Take 1 tablet by mouth daily.  ? vitamin B-12 (CYANOCOBALAMIN) 1000 MCG tablet Take 1,000 mcg by mouth daily.  ? vitamin E 400 UNIT capsule Take 400 Units by mouth daily.  ? VYZULTA 0.024 % SOLN Place 1 drop into the right eye at bedtime.  ?  ? ?Allergies:   Patient has no known allergies.  ? ?Social History  ? ?Socioeconomic History  ? Marital status: Widowed  ?  Spouse name: Not on file  ? Number of children: Not on file  ? Years of education: Not on file  ? Highest education level: Not on file  ?Occupational History  ? Not on file  ?Tobacco Use  ? Smoking status: Former  ? Smokeless tobacco: Never  ?Vaping Use  ? Vaping Use: Never used  ?Substance and Sexual Activity  ? Alcohol use: Not Currently  ?  Alcohol/week: 1.0 standard drink  ?  Types: 1 Glasses of wine per week  ? Drug use: No  ? Sexual activity: Yes  ?Other Topics Concern  ? Not on file  ?Social History Narrative  ? Not on file  ? ?Social Determinants of Health  ? ?Financial Resource Strain: Not on file  ?Food Insecurity: Not on file  ?Transportation Needs: Not on file  ?Physical Activity: Not on file  ?Stress: Not on file  ?Social Connections: Not on file  ?  ? ?Family History: ?The patient's family history includes Healthy in his sister, sister, and  sister; Heart attack in his maternal grandfather; Heart disease in his father. ? ?ROS:   ?Please see the history of present illness.    ?Having left eye difficulty due to macular degeneration.  Not having angina.  Denies claudication.  All other systems reviewed and are negative. ? ?EKGs/Labs/Other Studies Reviewed:   ? ?The following studies were reviewed today: ?2D Doppler echocardiogram 08/19/2021: ?IMPRESSIONS  ? ? ? 1. Left ventricular ejection fraction, by estimation, is 55 to 60%. The  ?left ventricle has normal function. The left ventricle has no regional  ?wall motion abnormalities. There is mild left ventricular hypertrophy.  ?Left ventricular diastolic function  ?could not be evaluated.  ? 2. Right ventricular systolic function is  low normal. The right  ?ventricular size is mildly enlarged.  ? 3. Left atrial size was severely dilated.  ? 4. Right atrial size was moderately dilated.  ? 5. S/p XTW clip (A2/P2) on 05/09/21. Peak and mean gradients through the  ?valve are 14 and 4 mm Hg respectively . The mitral valve has been  ?repaired/replaced. Mild to moderate mitral valve regurgitation.  ? 6. Tricuspid valve regurgitation is mild to moderate.  ? 7. The aortic valve is tricuspid. Aortic valve regurgitation is not  ?visualized. Aortic valve sclerosis is present, with no evidence of aortic  ?valve stenosis.  ? 8. Pulmonic valve regurgitation is moderate to severe.  ? 9. The inferior vena cava is normal in size with greater than 50%  ?respiratory variability, suggesting right atrial pressure of 3 mmHg.  ? ?EKG:  EKG the tracing performed 05/10/2021 is personally reviewed and reveals A-fib with slow ventricular response and nonspecific ST T wave. ? ?Recent Labs: ?02/25/2021: TSH 1.897 ?05/06/2021: ALT 20 ?08/06/2021: BNP 199.1; BUN 15; Creatinine, Ser 1.09; Hemoglobin 12.8; Platelets 166; Potassium 4.5; Sodium 140  ?Recent Lipid Panel ?   ?Component Value Date/Time  ? CHOL 130 02/25/2021 0249  ? CHOL 86 (L)  10/10/2019 0744  ? TRIG 102 02/25/2021 0249  ? HDL 35 (L) 02/25/2021 0249  ? HDL 44 10/10/2019 0744  ? CHOLHDL 3.7 02/25/2021 0249  ? VLDL 20 02/25/2021 0249  ? [place] 75 02/25/2021 0249  ? [place] 24 10/10/2019 074

## 2021-10-30 ENCOUNTER — Ambulatory Visit (INDEPENDENT_AMBULATORY_CARE_PROVIDER_SITE_OTHER): Payer: Medicare Other | Admitting: Interventional Cardiology

## 2021-10-30 ENCOUNTER — Encounter: Payer: Self-pay | Admitting: Interventional Cardiology

## 2021-10-30 VITALS — BP 145/62 | HR 62 | Ht 68.0 in | Wt 149.0 lb

## 2021-10-30 DIAGNOSIS — I340 Nonrheumatic mitral (valve) insufficiency: Secondary | ICD-10-CM | POA: Diagnosis not present

## 2021-10-30 DIAGNOSIS — E785 Hyperlipidemia, unspecified: Secondary | ICD-10-CM | POA: Diagnosis not present

## 2021-10-30 DIAGNOSIS — I739 Peripheral vascular disease, unspecified: Secondary | ICD-10-CM

## 2021-10-30 DIAGNOSIS — E0800 Diabetes mellitus due to underlying condition with hyperosmolarity without nonketotic hyperglycemic-hyperosmolar coma (NKHHC): Secondary | ICD-10-CM | POA: Diagnosis not present

## 2021-10-30 DIAGNOSIS — I10 Essential (primary) hypertension: Secondary | ICD-10-CM | POA: Diagnosis not present

## 2021-10-30 DIAGNOSIS — I2510 Atherosclerotic heart disease of native coronary artery without angina pectoris: Secondary | ICD-10-CM | POA: Diagnosis not present

## 2021-10-30 DIAGNOSIS — I5032 Chronic diastolic (congestive) heart failure: Secondary | ICD-10-CM | POA: Diagnosis not present

## 2021-10-30 DIAGNOSIS — I4819 Other persistent atrial fibrillation: Secondary | ICD-10-CM

## 2021-10-30 NOTE — Patient Instructions (Signed)
Medication Instructions:  ?Your physician recommends that you continue on your current medications as directed. Please refer to the Current Medication list given to you today. ? ?*If you need a refill on your cardiac medications before your next appointment, please call your pharmacy* ? ? ?Lab Work: ?None ?If you have labs (blood work) drawn today and your tests are completely normal, you will receive your results only by: ?MyChart Message (if you have MyChart) OR ?A paper copy in the mail ?If you have any lab test that is abnormal or we need to change your treatment, we will call you to review the results. ? ? ?Testing/Procedures: ?None ? ? ?Follow-Up: ?At [facility], you and your health needs are our priority.  As part of our continuing mission to provide you with exceptional heart care, we have created designated Provider Care Teams.  These Care Teams include your primary Cardiologist (physician) and Advanced Practice Providers (APPs -  Physician Assistants and Nurse Practitioners) who all work together to provide you with the care you need, when you need it. ? ?We recommend signing up for the patient portal called "MyChart".  Sign up information is provided on this After Visit Summary.  MyChart is used to connect with patients for Virtual Visits (Telemedicine).  Patients are able to view lab/test results, encounter notes, upcoming appointments, etc.  Non-urgent messages can be sent to your provider as well.   ?To learn more about what you can do with MyChart, go to [email or web address].   ? ?Your next appointment:   ?6 month(s) ? ?The format for your next appointment:   ?In Person ? ?Provider:   ?Henry W Smith III, MD  ? ? ?Other Instructions ?  ?

## 2021-11-06 ENCOUNTER — Telehealth: Payer: Self-pay

## 2021-11-06 DIAGNOSIS — R051 Acute cough: Secondary | ICD-10-CM

## 2021-11-06 DIAGNOSIS — I5032 Chronic diastolic (congestive) heart failure: Secondary | ICD-10-CM

## 2021-11-06 DIAGNOSIS — I340 Nonrheumatic mitral (valve) insufficiency: Secondary | ICD-10-CM

## 2021-11-06 NOTE — Telephone Encounter (Signed)
The pt called into the office because he is experiencing a cough that he feels is worsening and he attributes this to his "leaking valve".  The pt is s/p TEER 05/09/21 and on POD1 Echo he had mild MR.  Most recent echo (08/19/2021) pt is noted to have mild-mod MR and was evaluated by Dr Burt Knack the same day. The pt just saw Dr Tamala Julian one week ago on 10/30/2021 and overall was doing well.  The pt said that he did have a cough every now and then prior to seeing Dr Tamala Julian but this was not very noticeable.  With in the past week the cough has suddenly worsened and he has coughing spells that make it difficult to catch his breath.  The pt has also noticed a worsening in SOB.  The pt denies swelling and his weight, BP and pulse are stable.  I will forward this information to Dr Burt Knack and Dr Tamala Julian for further review. Pt agreed with plan.  ?

## 2021-11-07 ENCOUNTER — Other Ambulatory Visit: Payer: Medicare Other

## 2021-11-07 ENCOUNTER — Ambulatory Visit
Admission: RE | Admit: 2021-11-07 | Discharge: 2021-11-07 | Disposition: A | Discharge status: Home / Self Care | Payer: Medicare Other | Source: Ambulatory Visit | Attending: Interventional Cardiology | Admitting: Interventional Cardiology

## 2021-11-07 DIAGNOSIS — R051 Acute cough: Secondary | ICD-10-CM | POA: Diagnosis not present

## 2021-11-07 DIAGNOSIS — I5032 Chronic diastolic (congestive) heart failure: Secondary | ICD-10-CM | POA: Diagnosis not present

## 2021-11-07 DIAGNOSIS — R059 Cough, unspecified: Secondary | ICD-10-CM | POA: Diagnosis not present

## 2021-11-07 NOTE — Telephone Encounter (Signed)
Micheal [name], ?Did he have a cough like this prior to the admission for heart failure in August?  Is he having any palpitations?  Is he coughing up phlegm?  Is he having any chills or fever? ?Please send the patient for PA and lateral chest x-ray, BNP, CBC with differential, and c-Met. ?

## 2021-11-07 NOTE — Telephone Encounter (Signed)
Pt states the cough is similar to what he had prior to hospital admission for CHF last year.  Denies phlegm, states just dry.  Denies fever, chills or palps.  No swelling.  Weight is stable.  Played golf this morning with no issues other than the intermittent coughing fits.  Pt is going to come by to have labs drawn now and then will go over to [facility] for CXR.  ?

## 2021-11-08 LAB — CBC WITH DIFFERENTIAL/PLATELET
Basophils Absolute: 0.1 10*3/uL (ref 0.0–0.2)
Basos: 1 %
EOS (ABSOLUTE): 0.4 10*3/uL (ref 0.0–0.4)
Eos: 5 %
Hematocrit: 37.7 % (ref 37.5–51.0)
Hemoglobin: 12.8 g/dL — ABNORMAL LOW (ref 13.0–17.7)
Immature Grans (Abs): 0.1 10*3/uL (ref 0.0–0.1)
Immature Granulocytes: 1 %
Lymphocytes Absolute: 1 10*3/uL (ref 0.7–3.1)
Lymphs: 12 %
MCH: 33.3 pg — ABNORMAL HIGH (ref 26.6–33.0)
MCHC: 34 g/dL (ref 31.5–35.7)
MCV: 98 fL — ABNORMAL HIGH (ref 79–97)
Monocytes Absolute: 0.9 10*3/uL (ref 0.1–0.9)
Monocytes: 11 %
Neutrophils Absolute: 5.8 10*3/uL (ref 1.4–7.0)
Neutrophils: 70 %
Platelets: 241 10*3/uL (ref 150–450)
RBC: 3.84 x10E6/uL — ABNORMAL LOW (ref 4.14–5.80)
RDW: 13.5 % (ref 11.6–15.4)
WBC: 8.2 10*3/uL (ref 3.4–10.8)

## 2021-11-08 LAB — HEPATIC FUNCTION PANEL
ALT: 14 IU/L (ref 0–44)
AST: 15 IU/L (ref 0–40)
Albumin: 4.2 g/dL (ref 3.6–4.6)
Alkaline Phosphatase: 84 IU/L (ref 44–121)
Bilirubin Total: 1.7 mg/dL — ABNORMAL HIGH (ref 0.0–1.2)
Bilirubin, Direct: 0.52 mg/dL — ABNORMAL HIGH (ref 0.00–0.40)
Total Protein: 6.9 g/dL (ref 6.0–8.5)

## 2021-11-08 LAB — BASIC METABOLIC PANEL
BUN/Creatinine Ratio: 21 (ref 10–24)
BUN: 32 mg/dL — ABNORMAL HIGH (ref 8–27)
CO2: 23 mmol/L (ref 20–29)
Calcium: 9.5 mg/dL (ref 8.6–10.2)
Chloride: 96 mmol/L (ref 96–106)
Creatinine, Ser: 1.53 mg/dL — ABNORMAL HIGH (ref 0.76–1.27)
Glucose: 146 mg/dL — ABNORMAL HIGH (ref 70–99)
Potassium: 5 mmol/L (ref 3.5–5.2)
Sodium: 137 mmol/L (ref 134–144)
eGFR: 45 mL/min/{1.73_m2} — ABNORMAL LOW (ref >59)

## 2021-11-08 LAB — PRO B NATRIURETIC PEPTIDE: NT-Pro BNP: 4141 pg/mL — ABNORMAL HIGH (ref 0–486)

## 2021-11-11 MED ORDER — POTASSIUM CHLORIDE CRYS ER 20 MEQ PO TBCR: 20.0000 meq | EXTENDED_RELEASE_TABLET | Freq: Every day | ORAL | 4 refills | Status: DC

## 2021-11-11 MED ORDER — FUROSEMIDE 20 MG PO TABS: 20.0000 mg | ORAL_TABLET | Freq: Every day | ORAL | 4 refills | Status: DC

## 2021-11-12 ENCOUNTER — Telehealth: Payer: Self-pay | Admitting: Interventional Cardiology

## 2021-11-12 NOTE — Telephone Encounter (Signed)
Pt c/o medication issue: ? ?1. Name of Medication:  ?furosemide (LASIX) 20 MG tablet ? ?2. How are you currently taking this medication (dosage and times per day)?  ? ?3. Are you having a reaction (difficulty breathing--STAT)?  ? ?4. What is your medication issue?  ? ?Patient states he doubled his intake for today (40 MG total this morning). He would like to confirm he is supposed to double Furosemide 20 MG tomorrow as well. ? ?

## 2021-11-13 ENCOUNTER — Other Ambulatory Visit: Payer: Self-pay | Admitting: Cardiology

## 2021-11-13 NOTE — Telephone Encounter (Signed)
Spoke with pt and made him aware that we wanted him to double of for 2 days, so if yesterday was the first day, he would need to take it again today.  Pt will take extra dose today and then go back to normal regimen tomorrow.  Pt appreciative for call.  ?

## 2021-11-14 ENCOUNTER — Other Ambulatory Visit: Payer: Medicare Other

## 2021-11-14 DIAGNOSIS — I5032 Chronic diastolic (congestive) heart failure: Secondary | ICD-10-CM | POA: Diagnosis not present

## 2021-11-15 ENCOUNTER — Telehealth: Payer: Self-pay | Admitting: Cardiology

## 2021-11-15 LAB — BASIC METABOLIC PANEL
BUN/Creatinine Ratio: 19 (ref 10–24)
BUN: 27 mg/dL (ref 8–27)
CO2: 27 mmol/L (ref 20–29)
Calcium: 9.7 mg/dL (ref 8.6–10.2)
Chloride: 94 mmol/L — ABNORMAL LOW (ref 96–106)
Creatinine, Ser: 1.42 mg/dL — ABNORMAL HIGH (ref 0.76–1.27)
Glucose: 176 mg/dL — ABNORMAL HIGH (ref 70–99)
Potassium: 4.7 mmol/L (ref 3.5–5.2)
Sodium: 137 mmol/L (ref 134–144)
eGFR: 50 mL/min/{1.73_m2} — ABNORMAL LOW (ref >59)

## 2021-11-15 NOTE — Telephone Encounter (Signed)
Patient called to inform our team that he has been feeling better with the addition of Lasix and Delsym. He would like to cancel his follow up visit with Dr. Katrinka Blazing on 11/19/21. He already has scheduled 6 month follow up with him 04/2022. He will call back if he feels that he needs to be seen sooner.  ? ? ?Georgie Chard NP-C ?Structural Heart Team  ?Pager: [phone] ?Phone: [phone] ? ?

## 2021-11-19 ENCOUNTER — Ambulatory Visit: Payer: Medicare Other | Admitting: Interventional Cardiology

## 2021-12-10 ENCOUNTER — Encounter (INDEPENDENT_AMBULATORY_CARE_PROVIDER_SITE_OTHER): Payer: Medicare Other | Admitting: Ophthalmology

## 2021-12-10 DIAGNOSIS — I10 Essential (primary) hypertension: Secondary | ICD-10-CM

## 2021-12-10 DIAGNOSIS — H43813 Vitreous degeneration, bilateral: Secondary | ICD-10-CM | POA: Diagnosis not present

## 2021-12-10 DIAGNOSIS — H353231 Exudative age-related macular degeneration, bilateral, with active choroidal neovascularization: Secondary | ICD-10-CM

## 2021-12-10 DIAGNOSIS — H35033 Hypertensive retinopathy, bilateral: Secondary | ICD-10-CM | POA: Diagnosis not present

## 2021-12-11 ENCOUNTER — Encounter (INDEPENDENT_AMBULATORY_CARE_PROVIDER_SITE_OTHER): Payer: Medicare Other | Admitting: Ophthalmology

## 2021-12-11 DIAGNOSIS — H353211 Exudative age-related macular degeneration, right eye, with active choroidal neovascularization: Secondary | ICD-10-CM | POA: Diagnosis not present

## 2021-12-26 ENCOUNTER — Other Ambulatory Visit: Payer: Self-pay | Admitting: Interventional Cardiology

## 2021-12-26 NOTE — Telephone Encounter (Signed)
Pt last saw Dr Katrinka Blazing 10/30/21, last labs 11/14/21 Creat 1.42, age 81, weight 67.6kg, based on specified criteria pt is on appropriate dosage of Eliquis 5mg  BID for afib.  Will refill rx.

## 2022-01-02 DIAGNOSIS — N289 Disorder of kidney and ureter, unspecified: Secondary | ICD-10-CM | POA: Diagnosis not present

## 2022-01-02 DIAGNOSIS — I4891 Unspecified atrial fibrillation: Secondary | ICD-10-CM | POA: Diagnosis not present

## 2022-01-02 DIAGNOSIS — I10 Essential (primary) hypertension: Secondary | ICD-10-CM | POA: Diagnosis not present

## 2022-01-02 DIAGNOSIS — R0609 Other forms of dyspnea: Secondary | ICD-10-CM | POA: Diagnosis not present

## 2022-01-02 DIAGNOSIS — E785 Hyperlipidemia, unspecified: Secondary | ICD-10-CM | POA: Diagnosis not present

## 2022-01-02 DIAGNOSIS — I25810 Atherosclerosis of coronary artery bypass graft(s) without angina pectoris: Secondary | ICD-10-CM | POA: Diagnosis not present

## 2022-01-02 DIAGNOSIS — I5033 Acute on chronic diastolic (congestive) heart failure: Secondary | ICD-10-CM | POA: Diagnosis not present

## 2022-02-04 ENCOUNTER — Encounter (INDEPENDENT_AMBULATORY_CARE_PROVIDER_SITE_OTHER): Payer: Medicare Other | Admitting: Ophthalmology

## 2022-02-04 DIAGNOSIS — H43813 Vitreous degeneration, bilateral: Secondary | ICD-10-CM

## 2022-02-04 DIAGNOSIS — H353231 Exudative age-related macular degeneration, bilateral, with active choroidal neovascularization: Secondary | ICD-10-CM

## 2022-02-04 DIAGNOSIS — I10 Essential (primary) hypertension: Secondary | ICD-10-CM

## 2022-02-04 DIAGNOSIS — H35033 Hypertensive retinopathy, bilateral: Secondary | ICD-10-CM | POA: Diagnosis not present

## 2022-02-05 ENCOUNTER — Encounter (INDEPENDENT_AMBULATORY_CARE_PROVIDER_SITE_OTHER): Payer: Medicare Other | Admitting: Ophthalmology

## 2022-02-05 DIAGNOSIS — H353211 Exudative age-related macular degeneration, right eye, with active choroidal neovascularization: Secondary | ICD-10-CM

## 2022-02-07 ENCOUNTER — Other Ambulatory Visit: Payer: Self-pay | Admitting: Interventional Cardiology

## 2022-03-04 DIAGNOSIS — Z1331 Encounter for screening for depression: Secondary | ICD-10-CM | POA: Diagnosis not present

## 2022-03-04 DIAGNOSIS — H401131 Primary open-angle glaucoma, bilateral, mild stage: Secondary | ICD-10-CM | POA: Diagnosis not present

## 2022-03-04 DIAGNOSIS — I25119 Atherosclerotic heart disease of native coronary artery with unspecified angina pectoris: Secondary | ICD-10-CM | POA: Diagnosis not present

## 2022-03-04 DIAGNOSIS — E785 Hyperlipidemia, unspecified: Secondary | ICD-10-CM | POA: Diagnosis not present

## 2022-03-04 DIAGNOSIS — I700 Atherosclerosis of aorta: Secondary | ICD-10-CM | POA: Diagnosis not present

## 2022-03-04 DIAGNOSIS — I4891 Unspecified atrial fibrillation: Secondary | ICD-10-CM | POA: Diagnosis not present

## 2022-03-04 DIAGNOSIS — I10 Essential (primary) hypertension: Secondary | ICD-10-CM | POA: Diagnosis not present

## 2022-03-04 DIAGNOSIS — E1169 Type 2 diabetes mellitus with other specified complication: Secondary | ICD-10-CM | POA: Diagnosis not present

## 2022-03-04 DIAGNOSIS — K8020 Calculus of gallbladder without cholecystitis without obstruction: Secondary | ICD-10-CM | POA: Diagnosis not present

## 2022-03-04 DIAGNOSIS — Z0000 Encounter for general adult medical examination without abnormal findings: Secondary | ICD-10-CM | POA: Diagnosis not present

## 2022-03-04 DIAGNOSIS — Z7189 Other specified counseling: Secondary | ICD-10-CM | POA: Diagnosis not present

## 2022-03-17 ENCOUNTER — Other Ambulatory Visit: Payer: Self-pay | Admitting: Interventional Cardiology

## 2022-03-25 ENCOUNTER — Telehealth: Payer: Self-pay

## 2022-03-25 DIAGNOSIS — I340 Nonrheumatic mitral (valve) insufficiency: Secondary | ICD-10-CM

## 2022-03-25 NOTE — Telephone Encounter (Signed)
Called to schedule 1 year MitraClip echo/office visit. He is already scheduled with Dr. Katrinka Blazing 05/05/2022. Scheduled echo prior to visit. He understands he will be called to review [facility]. He was grateful for call and agrees with plan.

## 2022-04-01 ENCOUNTER — Encounter (INDEPENDENT_AMBULATORY_CARE_PROVIDER_SITE_OTHER): Payer: Medicare Other | Admitting: Ophthalmology

## 2022-04-01 DIAGNOSIS — I10 Essential (primary) hypertension: Secondary | ICD-10-CM

## 2022-04-01 DIAGNOSIS — H35033 Hypertensive retinopathy, bilateral: Secondary | ICD-10-CM

## 2022-04-01 DIAGNOSIS — H353231 Exudative age-related macular degeneration, bilateral, with active choroidal neovascularization: Secondary | ICD-10-CM | POA: Diagnosis not present

## 2022-04-01 DIAGNOSIS — H43813 Vitreous degeneration, bilateral: Secondary | ICD-10-CM | POA: Diagnosis not present

## 2022-04-02 ENCOUNTER — Encounter (INDEPENDENT_AMBULATORY_CARE_PROVIDER_SITE_OTHER): Payer: Medicare Other | Admitting: Ophthalmology

## 2022-04-02 DIAGNOSIS — H353211 Exudative age-related macular degeneration, right eye, with active choroidal neovascularization: Secondary | ICD-10-CM

## 2022-04-24 ENCOUNTER — Other Ambulatory Visit: Payer: Self-pay | Admitting: Physician Assistant

## 2022-05-04 NOTE — H&P (View-Only) (Signed)
Cardiology Office Note:    Date:  05/05/2022   ID:  Micheal [name], DOB Aug 17, 1940, MRN [id number]  PCP:  Lorenda Ishihara, MD  Cardiologist:  Lesleigh Noe, MD   Referring MD: Lorenda Ishihara,*   Chief Complaint  Patient presents with   Cardiac Valve Problem    That is post MitraClip with residual MR   Hypertension   Hyperlipidemia   Coronary Artery Disease    History of Present Illness:    Micheal [name] is a 81 y.o. 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   He is doing well but notices that progressively his endurance is decreasing.  He plays golf 3 times per week and if he has a walk up an incline to get to the green, he will not do it because of the way it makes him feel.  He denies orthopnea, PND, angina, palpitations, and syncope.  He has markedly improved compared to July 2022 when he had acute diastolic heart failure, MR, it was noted for the first time to be in atrial fibrillation.  It was ultimately decided that left atrial size prevented ablation.  Rate control is adequate.  He is on minimal beta-blocker therapy.  May have chronotropic incompetence.  Past Medical History:  Diagnosis Date   CAD (coronary artery disease)    with Cabg 2008   Claudication (HCC)    Diabetes (HCC)    HTN (hypertension)    Hypercholesteremia    Hyperlipidemia    S/P mitral valve clip implantation 05/09/2021   one XTW clip positioned on A2/P2 per Dr. Jorge Ny    Past Surgical History:  Procedure Laterality Date   ABDOMINAL AORTOGRAM W/LOWER EXTREMITY N/A 09/22/2018   Procedure: ABDOMINAL AORTOGRAM W/LOWER EXTREMITY;  Surgeon: Iran Ouch, MD;  [facility]: [facility];  Service: Cardiovascular;  Laterality: N/A;   CARDIAC CATHETERIZATION   04/03/2021   CARDIOVERSION N/A 02/26/2021   Procedure: CARDIOVERSION;  Surgeon: Elease Hashimoto Deloris Ping, MD;  Location: [facility] ENDOSCOPY;  Service: Cardiovascular;  Laterality: N/A;   CORONARY ARTERY BYPASS GRAFT  2008   Coronary bypass surgery     2008   EYE SURGERY Bilateral    laser surgery by Dr. Jerolyn Center   MITRAL VALVE REPAIR N/A 05/09/2021   Procedure: MITRAL VALVE REPAIR;  Surgeon: Orbie Pyo, MD;  Location: [facility];  Service: Cardiovascular;  Laterality: N/A;   RIGHT/LEFT HEART CATH AND CORONARY/GRAFT ANGIOGRAPHY N/A 04/03/2021   Procedure: RIGHT/LEFT HEART CATH AND CORONARY/GRAFT ANGIOGRAPHY;  Surgeon: Lyn Records, MD;  Location: [facility];  Service: Cardiovascular;  Laterality: N/A;   TEE WITHOUT CARDIOVERSION N/A 02/26/2021   Procedure: TRANSESOPHAGEAL ECHOCARDIOGRAM (TEE);  Surgeon: Elease Hashimoto Deloris Ping, MD;  Location: [facility] ENDOSCOPY;  Service: Cardiovascular;  Laterality: N/A;   TEE WITHOUT CARDIOVERSION N/A 05/09/2021   Procedure: TRANSESOPHAGEAL ECHOCARDIOGRAM (TEE);  Surgeon: Orbie Pyo, MD;  Location: [facility] INVASIVE CV LAB;  Service: Cardiovascular;  Laterality: N/A;    Current Medications: Current Meds  Medication Sig   amoxicillin (AMOXIL) 500 MG tablet TAKE 4 TABLETS ONE HOUR PRIOR TO DENTAL APPOINTMENT   Cholecalciferol (VITAMIN D) 50 MCG (2000 UT) tablet Take 2,000 Units by mouth in the morning and at bedtime.   CINNAMON PO Take 1,000 mg by mouth daily.   Coenzyme Q10 (CO  Q-10) 200 MG CAPS Take 200 mg by mouth daily.    dorzolamide-timolol (COSOPT) 22.3-6.8 MG/ML ophthalmic solution Place 1 drop into both eyes 2 (two) times daily.   ELIQUIS 5 MG TABS tablet TAKE 1 TABLET BY MOUTH TWICE A DAY   furosemide (LASIX) 20 MG tablet TAKE 1 TABLET BY MOUTH EVERY DAY   Glucosamine-Chondroitin (GLUCOSAMINE CHONDR COMPLEX PO) Take 1 tablet by mouth daily.   isosorbide mononitrate (IMDUR) 30 MG 24 hr tablet TAKE 1 TABLET BY MOUTH EVERY DAY   KLOR-CON M20 20 MEQ  tablet TAKE 1 TABLET BY MOUTH EVERY DAY   levOCARNitine (L-CARNITINE) 500 MG TABS Take 500 mg by mouth in the morning.   losartan-hydrochlorothiazide (HYZAAR) 50-12.5 MG tablet TAKE 1 TABLET BY MOUTH EVERY DAY   metFORMIN (GLUCOPHAGE) 1000 MG tablet Take 1,000 mg by mouth at bedtime.    metoprolol succinate (TOPROL-XL) 25 MG 24 hr tablet TAKE 1 TABLET (25 MG TOTAL) BY MOUTH DAILY.   Misc Natural Products (NF FORMULAS TESTOSTERONE) CAPS Take 1 capsule by mouth in the morning.   Multiple Vitamins-Minerals (PRESERVISION AREDS 2 PO) Take 1 capsule by mouth 2 (two) times daily.   Omega-3 Fatty Acids (FISH OIL PO) Take 1 capsule by mouth daily.   pravastatin (PRAVACHOL) 20 MG tablet Take 20 mg by mouth at bedtime.   PROLENSA 0.07 % SOLN Place 1 drop into both eyes daily.   Propylene Glycol (SYSTANE BALANCE) 0.6 % SOLN Place 1 drop into both eyes daily as needed (dry eyes).   QUERCETIN PO Take 1 tablet by mouth daily.   vitamin B-12 (CYANOCOBALAMIN) 1000 MCG tablet Take 1,000 mcg by mouth daily.   vitamin E 400 UNIT capsule Take 400 Units by mouth daily.   VYZULTA 0.024 % SOLN Place 1 drop into the right eye at bedtime.     Allergies:   Patient has no known allergies.   Social History   Socioeconomic History   Marital status: Widowed    Spouse name: Not on file   Number of children: Not on file   Years of education: Not on file   Highest education level: Not on file  Occupational History   Not on file  Tobacco Use   Smoking status: Former   Smokeless tobacco: Never  Vaping Use   Vaping Use: Never used  Substance and Sexual Activity   Alcohol use: Not Currently    Alcohol/week: 1.0 standard drink of alcohol    Types: 1 Glasses of wine per week   Drug use: No   Sexual activity: Yes  Other Topics Concern   Not on file  Social History Narrative   Not on file   Social Determinants of Health   Financial Resource Strain: Not on file  Food Insecurity: Not on file  Transportation  Needs: Not on file  Physical Activity: Not on file  Stress: Not on file  Social Connections: Not on file     Family History: The patient's family history includes Healthy in his sister, sister, and sister; Heart attack in his maternal grandfather; Heart disease in his father.  ROS:   Please see the history of present illness.    Weight is stable in the 147 pound range.  Appetite is good.  All other systems reviewed and are negative.  EKGs/Labs/Other Studies Reviewed:    The following studies were reviewed today:  ECHOCARDIOGRAM 07/2021: IMPRESSIONS   1. Left ventricular ejection fraction, by estimation, is 55 to 60%. The  left ventricle has  normal function. The left ventricle has no regional  wall motion abnormalities. There is mild left ventricular hypertrophy.  Left ventricular diastolic function  could not be evaluated.   2. Right ventricular systolic function is low normal. The right  ventricular size is mildly enlarged.   3. Left atrial size was severely dilated.   4. Right atrial size was moderately dilated.   5. S/p XTW clip (A2/P2) on 05/09/21. Peak and mean gradients through the  valve are 14 and 4 mm Hg respectively . The mitral valve has been  repaired/replaced. Mild to moderate mitral valve regurgitation.   6. Tricuspid valve regurgitation is mild to moderate.   7. The aortic valve is tricuspid. Aortic valve regurgitation is not  visualized. Aortic valve sclerosis is present, with no evidence of aortic  valve stenosis.   8. Pulmonic valve regurgitation is moderate to severe.   9. The inferior vena cava is normal in size with greater than 50%  respiratory variability, suggesting right atrial pressure of 3 mmHg.   EKG:  EKG atrial fibrillation, slow ventricular response, right axis.  Compared to prior tracing from 05/10/2021, no significant changes noted.  Recent Labs: 08/06/2021: BNP 199.1 11/07/2021: ALT 14; Hemoglobin 12.8; NT-Pro BNP 4,141; Platelets  241 11/14/2021: BUN 27; Creatinine, Ser 1.42; Potassium 4.7; Sodium 137  Recent Lipid Panel    Component Value Date/Time   CHOL 130 02/25/2021 0249   CHOL 86 (L) 10/10/2019 0744   TRIG 102 02/25/2021 0249   HDL 35 (L) 02/25/2021 0249   HDL 44 10/10/2019 0744   CHOLHDL 3.7 02/25/2021 0249   VLDL 20 02/25/2021 0249   LDLCALC 75 02/25/2021 0249   LDLCALC 24 10/10/2019 0744    Physical Exam:    VS:  BP (!) 142/62   Pulse (!) 56   Ht 5\' 8"  (1.727 m)   Wt 146 lb 8 oz (66.5 kg)   SpO2 93%   BMI 22.28 kg/m     Wt Readings from Last 3 Encounters:  05/05/22 146 lb 8 oz (66.5 kg)  10/30/21 149 lb (67.6 kg)  08/19/21 147 lb 12.8 oz (67 kg)     GEN: Compatible with age. No acute distress HEENT: Normal NECK: No JVD. LYMPHATICS: No lymphadenopathy CARDIAC: 2-3/6 apical to left axillary systolic murmur. IIRR no gallop, or edema. VASCULAR:  Normal Pulses. No bruits. RESPIRATORY:  Clear to auscultation without rales, wheezing or rhonchi  ABDOMEN: Soft, non-tender, non-distended, No pulsatile mass, MUSCULOSKELETAL: No deformity  SKIN: Warm and dry NEUROLOGIC:  Alert and oriented x 3 PSYCHIATRIC:  Normal affect   ASSESSMENT:    1. S/P mitral valve clip implantation   2. Persistent atrial fibrillation (HCC)   3. Chronic diastolic heart failure (HCC)   4. Coronary artery disease involving native coronary artery of native heart without angina pectoris   5. PAD (peripheral artery disease) (HCC)   6. Essential hypertension   7. Hyperlipidemia, unspecified hyperlipidemia type   8. Diabetes mellitus due to underlying condition with hyperosmolarity without coma, without long-term current use of insulin (HCC)   9. Chronotropic incompetence    PLAN:    In order of problems listed above:  Follow-up echo was performed today.  He has NYHA class III symptoms. We will follow-up with patient relating to results.  We will also forward results to the structural heart team, Dr. 08/21/21. Now rate  controlled and with severe LA enlargement, not likely to benefit from ablation or antiarrhythmic therapy. AFIB present for > 15 months.  Controlled rate. Secondary prevention Denies claudication Blood pressure is well controlled for age.  Target 140/80 mmHg. Continue pravastatin.  Most recent LDL 76 in August. Most recent A1c in August was 7.1.  Continue Glucophage and consider SGLT2. He will need to wear a monitor for 2 or 3 days to determine if he has reasonable heart rate responsiveness with physical activity.  One of the days that he wears the monitor needs to be one of his golfing days.    Follow-up will be with the structural team to determine if any further management strategies for MR if the upcoming echo demonstrates that MR is worsening.   Medication Adjustments/Labs and Tests Ordered: Current medicines are reviewed at length with the patient today.  Concerns regarding medicines are outlined above.  Orders Placed This Encounter  Procedures   EKG 12-Lead   No orders of the defined types were placed in this encounter.   Patient Instructions  Medication Instructions:  Your physician recommends that you continue on your current medications as directed. Please refer to the Current Medication list given to you today.  *If you need a refill on your cardiac medications before your next appointment, please call your pharmacy*  Lab Work: NONE  Testing/Procedures: NONE  Follow-Up: At [facility], you and your health needs are our priority.  As part of our continuing mission to provide you with exceptional heart care, we have created designated Provider Care Teams.  These Care Teams include your primary Cardiologist (physician) and Advanced Practice Providers (APPs -  Physician Assistants and Nurse Practitioners) who all work together to provide you with the care you need, when you need it.  Your next appointment:   4-6 month(s)  The format for your next appointment:    In Person  Provider:   Tonny Bollman, MD  Important Information About Sugar         Signed, Lesleigh Noe, MD  05/05/2022 9:52 AM    [facility]

## 2022-05-04 NOTE — Progress Notes (Unsigned)
Cardiology Office Note:    Date:  05/05/2022   ID:  Micheal [name], DOB 11/05/1940, MRN [id number]  PCP:  Lorenda Ishihara, MD  Cardiologist:  Lesleigh Noe, MD   Referring MD: Lorenda Ishihara,*   Chief Complaint  Patient presents with   Cardiac Valve Problem    That is post MitraClip with residual MR   Hypertension   Hyperlipidemia   Coronary Artery Disease    History of Present Illness:    Micheal [name] is a 81 y.o. 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   He is doing well but notices that progressively his endurance is decreasing.  He plays golf 3 times per week and if he has a walk up an incline to get to the green, he will not do it because of the way it makes him feel.  He denies orthopnea, PND, angina, palpitations, and syncope.  He has markedly improved compared to July 2022 when he had acute diastolic heart failure, MR, it was noted for the first time to be in atrial fibrillation.  It was ultimately decided that left atrial size prevented ablation.  Rate control is adequate.  He is on minimal beta-blocker therapy.  May have chronotropic incompetence.  Past Medical History:  Diagnosis Date   CAD (coronary artery disease)    with Cabg 2008   Claudication (HCC)    Diabetes (HCC)    HTN (hypertension)    Hypercholesteremia    Hyperlipidemia    S/P mitral valve clip implantation 05/09/2021   one XTW clip positioned on A2/P2 per Dr. Jorge Ny    Past Surgical History:  Procedure Laterality Date   ABDOMINAL AORTOGRAM W/LOWER EXTREMITY N/A 09/22/2018   Procedure: ABDOMINAL AORTOGRAM W/LOWER EXTREMITY;  Surgeon: Iran Ouch, MD;  Location: [facility] INVASIVE CV LAB;  Service: Cardiovascular;  Laterality: N/A;   CARDIAC CATHETERIZATION   04/03/2021   CARDIOVERSION N/A 02/26/2021   Procedure: CARDIOVERSION;  Surgeon: Elease Hashimoto Deloris Ping, MD;  Location: [facility] ENDOSCOPY;  Service: Cardiovascular;  Laterality: N/A;   CORONARY ARTERY BYPASS GRAFT  2008   Coronary bypass surgery     2008   EYE SURGERY Bilateral    laser surgery by Dr. Jerolyn Center   MITRAL VALVE REPAIR N/A 05/09/2021   Procedure: MITRAL VALVE REPAIR;  Surgeon: Orbie Pyo, MD;  Location: [facility] INVASIVE CV LAB;  Service: Cardiovascular;  Laterality: N/A;   RIGHT/LEFT HEART CATH AND CORONARY/GRAFT ANGIOGRAPHY N/A 04/03/2021   Procedure: RIGHT/LEFT HEART CATH AND CORONARY/GRAFT ANGIOGRAPHY;  Surgeon: Lyn Records, MD;  Location: [facility] INVASIVE CV LAB;  Service: Cardiovascular;  Laterality: N/A;   TEE WITHOUT CARDIOVERSION N/A 02/26/2021   Procedure: TRANSESOPHAGEAL ECHOCARDIOGRAM (TEE);  Surgeon: Elease Hashimoto Deloris Ping, MD;  Location: [facility] ENDOSCOPY;  Service: Cardiovascular;  Laterality: N/A;   TEE WITHOUT CARDIOVERSION N/A 05/09/2021   Procedure: TRANSESOPHAGEAL ECHOCARDIOGRAM (TEE);  Surgeon: Orbie Pyo, MD;  Location: [facility] INVASIVE CV LAB;  Service: Cardiovascular;  Laterality: N/A;    Current Medications: Current Meds  Medication Sig   amoxicillin (AMOXIL) 500 MG tablet TAKE 4 TABLETS ONE HOUR PRIOR TO DENTAL APPOINTMENT   Cholecalciferol (VITAMIN D) 50 MCG (2000 UT) tablet Take 2,000 Units by mouth in the morning and at bedtime.   CINNAMON PO Take 1,000 mg by mouth daily.   Coenzyme Q10 (CO  Q-10) 200 MG CAPS Take 200 mg by mouth daily.    dorzolamide-timolol (COSOPT) 22.3-6.8 MG/ML ophthalmic solution Place 1 drop into both eyes 2 (two) times daily.   ELIQUIS 5 MG TABS tablet TAKE 1 TABLET BY MOUTH TWICE A DAY   furosemide (LASIX) 20 MG tablet TAKE 1 TABLET BY MOUTH EVERY DAY   Glucosamine-Chondroitin (GLUCOSAMINE CHONDR COMPLEX PO) Take 1 tablet by mouth daily.   isosorbide mononitrate (IMDUR) 30 MG 24 hr tablet TAKE 1 TABLET BY MOUTH EVERY DAY   KLOR-CON M20 20 MEQ  tablet TAKE 1 TABLET BY MOUTH EVERY DAY   levOCARNitine (L-CARNITINE) 500 MG TABS Take 500 mg by mouth in the morning.   losartan-hydrochlorothiazide (HYZAAR) 50-12.5 MG tablet TAKE 1 TABLET BY MOUTH EVERY DAY   metFORMIN (GLUCOPHAGE) 1000 MG tablet Take 1,000 mg by mouth at bedtime.    metoprolol succinate (TOPROL-XL) 25 MG 24 hr tablet TAKE 1 TABLET (25 MG TOTAL) BY MOUTH DAILY.   Misc Natural Products (NF FORMULAS TESTOSTERONE) CAPS Take 1 capsule by mouth in the morning.   Multiple Vitamins-Minerals (PRESERVISION AREDS 2 PO) Take 1 capsule by mouth 2 (two) times daily.   Omega-3 Fatty Acids (FISH OIL PO) Take 1 capsule by mouth daily.   pravastatin (PRAVACHOL) 20 MG tablet Take 20 mg by mouth at bedtime.   PROLENSA 0.07 % SOLN Place 1 drop into both eyes daily.   Propylene Glycol (SYSTANE BALANCE) 0.6 % SOLN Place 1 drop into both eyes daily as needed (dry eyes).   QUERCETIN PO Take 1 tablet by mouth daily.   vitamin B-12 (CYANOCOBALAMIN) 1000 MCG tablet Take 1,000 mcg by mouth daily.   vitamin E 400 UNIT capsule Take 400 Units by mouth daily.   VYZULTA 0.024 % SOLN Place 1 drop into the right eye at bedtime.     Allergies:   Patient has no known allergies.   Social History   Socioeconomic History   Marital status: Widowed    Spouse name: Not on file   Number of children: Not on file   Years of education: Not on file   Highest education level: Not on file  Occupational History   Not on file  Tobacco Use   Smoking status: Former   Smokeless tobacco: Never  Vaping Use   Vaping Use: Never used  Substance and Sexual Activity   Alcohol use: Not Currently    Alcohol/week: 1.0 standard drink of alcohol    Types: 1 Glasses of wine per week   Drug use: No   Sexual activity: Yes  Other Topics Concern   Not on file  Social History Narrative   Not on file   Social Determinants of Health   Financial Resource Strain: Not on file  Food Insecurity: Not on file  Transportation  Needs: Not on file  Physical Activity: Not on file  Stress: Not on file  Social Connections: Not on file     Family History: The patient's family history includes Healthy in his sister, sister, and sister; Heart attack in his maternal grandfather; Heart disease in his father.  ROS:   Please see the history of present illness.    Weight is stable in the 147 pound range.  Appetite is good.  All other systems reviewed and are negative.  EKGs/Labs/Other Studies Reviewed:    The following studies were reviewed today:  ECHOCARDIOGRAM 07/2021: IMPRESSIONS   1. Left ventricular ejection fraction, by estimation, is 55 to 60%. The  left ventricle has  normal function. The left ventricle has no regional  wall motion abnormalities. There is mild left ventricular hypertrophy.  Left ventricular diastolic function  could not be evaluated.   2. Right ventricular systolic function is low normal. The right  ventricular size is mildly enlarged.   3. Left atrial size was severely dilated.   4. Right atrial size was moderately dilated.   5. S/p XTW clip (A2/P2) on 05/09/21. Peak and mean gradients through the  valve are 14 and 4 mm Hg respectively . The mitral valve has been  repaired/replaced. Mild to moderate mitral valve regurgitation.   6. Tricuspid valve regurgitation is mild to moderate.   7. The aortic valve is tricuspid. Aortic valve regurgitation is not  visualized. Aortic valve sclerosis is present, with no evidence of aortic  valve stenosis.   8. Pulmonic valve regurgitation is moderate to severe.   9. The inferior vena cava is normal in size with greater than 50%  respiratory variability, suggesting right atrial pressure of 3 mmHg.   EKG:  EKG atrial fibrillation, slow ventricular response, right axis.  Compared to prior tracing from 05/10/2021, no significant changes noted.  Recent Labs: 08/06/2021: BNP 199.1 11/07/2021: ALT 14; Hemoglobin 12.8; NT-Pro BNP 4,141; Platelets  241 11/14/2021: BUN 27; Creatinine, Ser 1.42; Potassium 4.7; Sodium 137  Recent Lipid Panel    Component Value Date/Time   CHOL 130 02/25/2021 0249   CHOL 86 (L) 10/10/2019 0744   TRIG 102 02/25/2021 0249   HDL 35 (L) 02/25/2021 0249   HDL 44 10/10/2019 0744   CHOLHDL 3.7 02/25/2021 0249   VLDL 20 02/25/2021 0249   LDLCALC 75 02/25/2021 0249   LDLCALC 24 10/10/2019 0744    Physical Exam:    VS:  BP (!) 142/62   Pulse (!) 56   Ht 5\' 8"  (1.727 m)   Wt 146 lb 8 oz (66.5 kg)   SpO2 93%   BMI 22.28 kg/m     Wt Readings from Last 3 Encounters:  05/05/22 146 lb 8 oz (66.5 kg)  10/30/21 149 lb (67.6 kg)  08/19/21 147 lb 12.8 oz (67 kg)     GEN: Compatible with age. No acute distress HEENT: Normal NECK: No JVD. LYMPHATICS: No lymphadenopathy CARDIAC: 2-3/6 apical to left axillary systolic murmur. IIRR no gallop, or edema. VASCULAR:  Normal Pulses. No bruits. RESPIRATORY:  Clear to auscultation without rales, wheezing or rhonchi  ABDOMEN: Soft, non-tender, non-distended, No pulsatile mass, MUSCULOSKELETAL: No deformity  SKIN: Warm and dry NEUROLOGIC:  Alert and oriented x 3 PSYCHIATRIC:  Normal affect   ASSESSMENT:    1. S/P mitral valve clip implantation   2. Persistent atrial fibrillation ([place])   3. Chronic diastolic heart failure ([place])   4. Coronary artery disease involving native coronary artery of native heart without angina pectoris   5. PAD (peripheral artery disease) ([place])   6. Essential hypertension   7. Hyperlipidemia, unspecified hyperlipidemia type   8. Diabetes mellitus due to underlying condition with hyperosmolarity without coma, without long-term current use of insulin ([place])   9. Chronotropic incompetence    PLAN:    In order of problems listed above:  Follow-up echo was performed today.  We will follow-up with patient relating to results.  We will also forward results to the structural heart team, Dr. Burt Knack. Now rate controlled and with severe LA  enlargement, not likely to benefit from ablation or antiarrhythmic therapy. AFIB present for > 15 months. Controlled rate. Secondary prevention Denies claudication  Blood pressure is well controlled for age.  Target 140/80 mmHg. Continue pravastatin.  Most recent LDL 76 in August. Most recent A1c in August was 7.1.  Continue Glucophage and consider SGLT2. He will need to wear a monitor for 2 or 3 days to determine if he has reasonable heart rate responsiveness with physical activity.  One of the days that he wears the monitor needs to be one of his golfing days.    Follow-up will be with the structural team to determine if any further management strategies for MR if the upcoming echo demonstrates that MR is worsening.   Medication Adjustments/Labs and Tests Ordered: Current medicines are reviewed at length with the patient today.  Concerns regarding medicines are outlined above.  Orders Placed This Encounter  Procedures   EKG 12-Lead   No orders of the defined types were placed in this encounter.   Patient Instructions  Medication Instructions:  Your physician recommends that you continue on your current medications as directed. Please refer to the Current Medication list given to you today.  *If you need a refill on your cardiac medications before your next appointment, please call your pharmacy*  Lab Work: NONE  Testing/Procedures: NONE  Follow-Up: At [facility], you and your health needs are our priority.  As part of our continuing mission to provide you with exceptional heart care, we have created designated Provider Care Teams.  These Care Teams include your primary Cardiologist (physician) and Advanced Practice Providers (APPs -  Physician Assistants and Nurse Practitioners) who all work together to provide you with the care you need, when you need it.  Your next appointment:   4-6 month(s)  The format for your next appointment:   In Person  Provider:    Tonny Bollman, MD  Important Information About Sugar         Signed, Lesleigh Noe, MD  05/05/2022 9:52 AM    [facility]

## 2022-05-05 ENCOUNTER — Ambulatory Visit (HOSPITAL_COMMUNITY): Payer: Medicare Other | Attending: Interventional Cardiology | Admitting: Interventional Cardiology

## 2022-05-05 ENCOUNTER — Ambulatory Visit (HOSPITAL_BASED_OUTPATIENT_CLINIC_OR_DEPARTMENT_OTHER): Payer: Medicare Other

## 2022-05-05 ENCOUNTER — Encounter: Payer: Self-pay | Admitting: Interventional Cardiology

## 2022-05-05 ENCOUNTER — Ambulatory Visit (INDEPENDENT_AMBULATORY_CARE_PROVIDER_SITE_OTHER): Payer: Medicare Other

## 2022-05-05 VITALS — BP 142/62 | HR 56 | Ht 68.0 in | Wt 146.5 lb

## 2022-05-05 DIAGNOSIS — I2510 Atherosclerotic heart disease of native coronary artery without angina pectoris: Secondary | ICD-10-CM | POA: Insufficient documentation

## 2022-05-05 DIAGNOSIS — I5032 Chronic diastolic (congestive) heart failure: Secondary | ICD-10-CM | POA: Diagnosis not present

## 2022-05-05 DIAGNOSIS — I4819 Other persistent atrial fibrillation: Secondary | ICD-10-CM | POA: Diagnosis not present

## 2022-05-05 DIAGNOSIS — Z95818 Presence of other cardiac implants and grafts: Secondary | ICD-10-CM | POA: Insufficient documentation

## 2022-05-05 DIAGNOSIS — Z9889 Other specified postprocedural states: Secondary | ICD-10-CM | POA: Insufficient documentation

## 2022-05-05 DIAGNOSIS — I340 Nonrheumatic mitral (valve) insufficiency: Secondary | ICD-10-CM | POA: Diagnosis not present

## 2022-05-05 DIAGNOSIS — I739 Peripheral vascular disease, unspecified: Secondary | ICD-10-CM | POA: Insufficient documentation

## 2022-05-05 DIAGNOSIS — I4589 Other specified conduction disorders: Secondary | ICD-10-CM | POA: Insufficient documentation

## 2022-05-05 DIAGNOSIS — E0800 Diabetes mellitus due to underlying condition with hyperosmolarity without nonketotic hyperglycemic-hyperosmolar coma (NKHHC): Secondary | ICD-10-CM | POA: Insufficient documentation

## 2022-05-05 DIAGNOSIS — E785 Hyperlipidemia, unspecified: Secondary | ICD-10-CM | POA: Insufficient documentation

## 2022-05-05 DIAGNOSIS — I10 Essential (primary) hypertension: Secondary | ICD-10-CM | POA: Diagnosis not present

## 2022-05-05 LAB — ECHOCARDIOGRAM COMPLETE
Area-P 1/2: 2.99 cm2
MV M vel: 5.6 m/s
MV Peak grad: 125.4 mmHg
MV VTI: 1.43 cm2
Radius: 0.5 cm
S' Lateral: 3.4 cm

## 2022-05-05 NOTE — Patient Instructions (Signed)
Medication Instructions:  Your physician recommends that you continue on your current medications as directed. Please refer to the Current Medication list given to you today.  *If you need a refill on your cardiac medications before your next appointment, please call your pharmacy*  Lab Work: NONE  Testing/Procedures: NONE  Follow-Up: At [facility], you and your health needs are our priority.  As part of our continuing mission to provide you with exceptional heart care, we have created designated Provider Care Teams.  These Care Teams include your primary Cardiologist (physician) and Advanced Practice Providers (APPs -  Physician Assistants and Nurse Practitioners) who all work together to provide you with the care you need, when you need it.  Your next appointment:   4-6 month(s)  The format for your next appointment:   In Person  Provider:   [name], MD  Important Information About Sugar

## 2022-05-05 NOTE — Addendum Note (Signed)
Addended by: Molli Barrows on: 05/05/2022 09:55 AM   Modules accepted: Orders

## 2022-05-05 NOTE — Progress Notes (Unsigned)
Enrolled for [employer] to mail a ZIO XT long term holter monitor to the patients address on file.  

## 2022-05-06 ENCOUNTER — Other Ambulatory Visit: Payer: Self-pay

## 2022-05-06 ENCOUNTER — Telehealth: Payer: Self-pay

## 2022-05-06 DIAGNOSIS — I340 Nonrheumatic mitral (valve) insufficiency: Secondary | ICD-10-CM

## 2022-05-06 DIAGNOSIS — H401133 Primary open-angle glaucoma, bilateral, severe stage: Secondary | ICD-10-CM | POA: Diagnosis not present

## 2022-05-06 DIAGNOSIS — Z9889 Other specified postprocedural states: Secondary | ICD-10-CM

## 2022-05-06 NOTE — Telephone Encounter (Signed)
Spoke with the patient at length and reviewed echocardiogram results. He reports increased DOE especially while playing golf and he thinks worsening MR could explain his symptoms.  Scheduled the patient for lab work 10/12. Scheduled the patient for TEE with Dr. Audie Box 10/26.  Instructions reviewed.  The patient was unaware of his 3 day Zio.  Reviewed MyChart messages and instructions. He has no further questions.  TEE orders entered.

## 2022-05-06 NOTE — Telephone Encounter (Signed)
-----   Message from [name], MD sent at 05/06/2022  4:54 AM EDT ----- Regarding: RE: Needs to be set up for TEE Hank:  You can set him up with me. I am doing TEEs on 10/18, 10/26, and 10/30.   -Wes  ----- Message ----- From: Belva Crome, MD Sent: 05/05/2022   7:14 PM EDT To: [name], MD; [name], MD; # Subject: Needs to be set up for TEE                     Kristie let him know that I reviewed the situation and discussed with Dr. Burt Knack. Seems the valve is leaking more than interpreted on most recent echo, Let him know he needs to be set-up for a transesophageal echo.   Ronalee Belts and Gwynneth Macleod, should it be done by a structural reader or a specific person?  ----- Message ----- From: [name], MD Sent: 05/05/2022   5:31 PM EDT To: Belva Crome, MD; [name], MD  MR looks severe. Reversal of flow in the R inferior pulmonary vein. Would recommend a TEE to clarify.   -W ----- Message ----- From: [name], MD Sent: 05/05/2022   5:24 PM EDT To: Belva Crome, MD; [name], MD  Looked at the echo. Wonder if residual MR is worse than moderate. We oftentimes underestimate the MR on a surface study and if severe it could explain the RV issues. I'm going to ask Wes to take a look and see what he thinks, but may be best to do a TEE.  ----- Message ----- From: Belva Crome, MD Sent: 05/05/2022   1:34 PM EDT To: [name], MD; Molli Barrows  The patient know that the mitral valve is leaking moderately that there is elevated pressure in the pulmonary artery with resultant enlargement and weakness of the right ventricle.  Ronalee Belts, do you have any recommendations on what to do about his pulmonary hypertension and moderate RV dysfunction.  He is on amlodipine, ARB, and is complaining of exertional fatigue.  Should I have the heart failure team see him?

## 2022-05-06 NOTE — Telephone Encounter (Signed)
Left message to call back. At that time, will arrange TEE and complete KCCQ.

## 2022-05-06 NOTE — Addendum Note (Signed)
Addended by: Harland German A on: 05/06/2022 01:02 PM   Modules accepted: Orders

## 2022-05-08 ENCOUNTER — Ambulatory Visit: Payer: Medicare Other | Attending: Cardiology

## 2022-05-08 DIAGNOSIS — I340 Nonrheumatic mitral (valve) insufficiency: Secondary | ICD-10-CM

## 2022-05-08 LAB — CBC WITH DIFFERENTIAL/PLATELET
Basophils Absolute: 0 10*3/uL (ref 0.0–0.2)
Basos: 1 %
EOS (ABSOLUTE): 0.1 10*3/uL (ref 0.0–0.4)
Eos: 2 %
Hematocrit: 39.9 % (ref 37.5–51.0)
Hemoglobin: 13.6 g/dL (ref 13.0–17.7)
Lymphocytes Absolute: 1.1 10*3/uL (ref 0.7–3.1)
Lymphs: 21 %
MCH: 34 pg — ABNORMAL HIGH (ref 26.6–33.0)
MCHC: 34.1 g/dL (ref 31.5–35.7)
MCV: 100 fL — ABNORMAL HIGH (ref 79–97)
Monocytes Absolute: 0.6 10*3/uL (ref 0.1–0.9)
Monocytes: 10 %
Neutrophils Absolute: 3.6 10*3/uL (ref 1.4–7.0)
Neutrophils: 66 %
Platelets: 173 10*3/uL (ref 150–450)
RBC: 4 x10E6/uL — ABNORMAL LOW (ref 4.14–5.80)
RDW: 15 % (ref 11.6–15.4)
WBC: 5.5 10*3/uL (ref 3.4–10.8)

## 2022-05-08 LAB — BASIC METABOLIC PANEL
BUN/Creatinine Ratio: 19 (ref 10–24)
BUN: 25 mg/dL (ref 8–27)
CO2: 30 mmol/L — ABNORMAL HIGH (ref 20–29)
Calcium: 9.7 mg/dL (ref 8.6–10.2)
Chloride: 104 mmol/L (ref 96–106)
Creatinine, Ser: 1.35 mg/dL — ABNORMAL HIGH (ref 0.76–1.27)
Glucose: 151 mg/dL — ABNORMAL HIGH (ref 70–99)
Potassium: 4.6 mmol/L (ref 3.5–5.2)
Sodium: 143 mmol/L (ref 134–144)
eGFR: 52 mL/min/{1.73_m2} — ABNORMAL LOW (ref >59)

## 2022-05-09 DIAGNOSIS — I4589 Other specified conduction disorders: Secondary | ICD-10-CM

## 2022-05-20 DIAGNOSIS — I4589 Other specified conduction disorders: Secondary | ICD-10-CM | POA: Diagnosis not present

## 2022-05-22 ENCOUNTER — Ambulatory Visit (HOSPITAL_BASED_OUTPATIENT_CLINIC_OR_DEPARTMENT_OTHER)
Admission: RE | Admit: 2022-05-22 | Discharge: 2022-05-22 | Disposition: A | Discharge status: Home / Self Care | Payer: Medicare Other | Source: Ambulatory Visit | Attending: Interventional Cardiology | Admitting: Interventional Cardiology

## 2022-05-22 ENCOUNTER — Ambulatory Visit (HOSPITAL_COMMUNITY)
Admission: RE | Admit: 2022-05-22 | Discharge: 2022-05-22 | Disposition: A | Discharge status: Home / Self Care | Payer: Medicare Other | Attending: Cardiovascular Disease | Admitting: Cardiovascular Disease

## 2022-05-22 ENCOUNTER — Telehealth: Payer: Self-pay

## 2022-05-22 ENCOUNTER — Ambulatory Visit (HOSPITAL_COMMUNITY): Payer: Medicare Other | Admitting: Anesthesiology

## 2022-05-22 ENCOUNTER — Encounter (HOSPITAL_COMMUNITY): Payer: Self-pay | Admitting: Cardiovascular Disease

## 2022-05-22 ENCOUNTER — Other Ambulatory Visit: Payer: Self-pay

## 2022-05-22 ENCOUNTER — Telehealth: Payer: Self-pay | Admitting: Cardiology

## 2022-05-22 ENCOUNTER — Ambulatory Visit (HOSPITAL_BASED_OUTPATIENT_CLINIC_OR_DEPARTMENT_OTHER): Payer: Medicare Other | Admitting: Anesthesiology

## 2022-05-22 ENCOUNTER — Encounter (HOSPITAL_COMMUNITY)
Admission: RE | Disposition: A | Discharge status: Home / Self Care | Payer: Self-pay | Source: Home / Self Care | Attending: Cardiovascular Disease

## 2022-05-22 DIAGNOSIS — I110 Hypertensive heart disease with heart failure: Secondary | ICD-10-CM | POA: Diagnosis not present

## 2022-05-22 DIAGNOSIS — I4819 Other persistent atrial fibrillation: Secondary | ICD-10-CM | POA: Diagnosis not present

## 2022-05-22 DIAGNOSIS — Z9889 Other specified postprocedural states: Secondary | ICD-10-CM

## 2022-05-22 DIAGNOSIS — Z87891 Personal history of nicotine dependence: Secondary | ICD-10-CM

## 2022-05-22 DIAGNOSIS — I4589 Other specified conduction disorders: Secondary | ICD-10-CM | POA: Diagnosis not present

## 2022-05-22 DIAGNOSIS — I340 Nonrheumatic mitral (valve) insufficiency: Secondary | ICD-10-CM | POA: Diagnosis not present

## 2022-05-22 DIAGNOSIS — Z951 Presence of aortocoronary bypass graft: Secondary | ICD-10-CM | POA: Insufficient documentation

## 2022-05-22 DIAGNOSIS — I081 Rheumatic disorders of both mitral and tricuspid valves: Secondary | ICD-10-CM | POA: Insufficient documentation

## 2022-05-22 DIAGNOSIS — I2510 Atherosclerotic heart disease of native coronary artery without angina pectoris: Secondary | ICD-10-CM | POA: Insufficient documentation

## 2022-05-22 DIAGNOSIS — I5032 Chronic diastolic (congestive) heart failure: Secondary | ICD-10-CM | POA: Diagnosis not present

## 2022-05-22 DIAGNOSIS — E785 Hyperlipidemia, unspecified: Secondary | ICD-10-CM | POA: Diagnosis not present

## 2022-05-22 DIAGNOSIS — E1151 Type 2 diabetes mellitus with diabetic peripheral angiopathy without gangrene: Secondary | ICD-10-CM | POA: Diagnosis not present

## 2022-05-22 DIAGNOSIS — I361 Nonrheumatic tricuspid (valve) insufficiency: Secondary | ICD-10-CM | POA: Diagnosis not present

## 2022-05-22 DIAGNOSIS — I083 Combined rheumatic disorders of mitral, aortic and tricuspid valves: Secondary | ICD-10-CM | POA: Diagnosis not present

## 2022-05-22 DIAGNOSIS — I509 Heart failure, unspecified: Secondary | ICD-10-CM | POA: Diagnosis not present

## 2022-05-22 HISTORY — PX: TEE WITHOUT CARDIOVERSION: SHX5443

## 2022-05-22 LAB — ECHO TEE
MV M vel: 5.8 m/s
MV Peak grad: 134.6 mmHg
Radius: 0.8 cm

## 2022-05-22 LAB — GLUCOSE, CAPILLARY: Glucose-Capillary: 121 mg/dL — ABNORMAL HIGH (ref 70–99)

## 2022-05-22 SURGERY — ECHOCARDIOGRAM, TRANSESOPHAGEAL
Anesthesia: Monitor Anesthesia Care

## 2022-05-22 MED ORDER — PROPOFOL 500 MG/50ML IV EMUL
INTRAVENOUS | Status: DC | PRN
  Administered 2022-05-22 (×2): 100 ug/kg/min via INTRAVENOUS

## 2022-05-22 MED ORDER — PROPOFOL 10 MG/ML IV BOLUS
INTRAVENOUS | Status: DC | PRN
  Administered 2022-05-22: 10 mg via INTRAVENOUS

## 2022-05-22 MED ORDER — SODIUM CHLORIDE 0.9 % IV SOLN: INTRAVENOUS | Status: DC

## 2022-05-22 MED ORDER — SODIUM CHLORIDE 0.9 % IV SOLN: INTRAVENOUS | Status: DC | PRN

## 2022-05-22 MED ORDER — PHENYLEPHRINE HCL (PRESSORS) 10 MG/ML IV SOLN
INTRAVENOUS | Status: DC | PRN
  Administered 2022-05-22: 200 ug via INTRAVENOUS

## 2022-05-22 NOTE — Anesthesia Procedure Notes (Signed)
Procedure Name: MAC Date/Time: 05/22/2022 8:25 AM  Performed by: Eligha Bridegroom, CRNAPre-anesthesia Checklist: Patient identified Patient Re-evaluated:Patient Re-evaluated prior to induction Oxygen Delivery Method: Nasal cannula Preoxygenation: Pre-oxygenation with 100% oxygen Induction Type: IV induction

## 2022-05-22 NOTE — Anesthesia Postprocedure Evaluation (Signed)
Anesthesia Post Note  Patient: Micheal [name]  Procedure(s) Performed: TRANSESOPHAGEAL ECHOCARDIOGRAM (TEE)     Patient location during evaluation: PACU Anesthesia Type: MAC Level of consciousness: awake and alert Pain management: pain level controlled Vital Signs Assessment: post-procedure vital signs reviewed and stable Respiratory status: spontaneous breathing, nonlabored ventilation, respiratory function stable and patient connected to nasal cannula oxygen Cardiovascular status: stable and blood pressure returned to baseline Postop Assessment: no apparent nausea or vomiting Anesthetic complications: no   No notable events documented.  Last Vitals:  Vitals:   05/22/22 0920 05/22/22 0930  BP: (!) 124/107 123/68  Pulse: 65 65  Resp: 18 17  Temp:    SpO2: 93% 95%    Last Pain:  Vitals:   05/22/22 0930  TempSrc:   PainSc: 0-No pain                 Effie Berkshire

## 2022-05-22 NOTE — Telephone Encounter (Signed)
[facility] MitraClip review of 05/22/2022 TEE:  For patient Micheal [name]: This is Primary MR  This valve looks suitable for another MitraClip implant. The original MitraClip implant is well seated and stable. In both the Bicaval and SAXB views, the fossa looks reasonable for a transseptal puncture. TR is noted. The LA dimensions appear large enough for steering and straddle of the Clip Delivery System. 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 Gradient measured at 2 mmHg (64 BPM); MVA measured 4.67 cm2. Based on the location of the defect, I'd like to suggest an NT.

## 2022-05-22 NOTE — CV Procedure (Signed)
    TRANSESOPHAGEAL ECHOCARDIOGRAM   NAME:  Micheal [name]    MRN: [id number] DOB:  [birth year]    ADMIT DATE: 05/22/2022  INDICATIONS: Severe MR  PROCEDURE:   Informed consent was obtained prior to the procedure. The risks, benefits and alternatives for the procedure were discussed and the patient comprehended these risks.  Risks include, but are not limited to, cough, sore throat, vomiting, nausea, somnolence, esophageal and stomach trauma or perforation, bleeding, low blood pressure, aspiration, pneumonia, infection, trauma to the teeth and death.    Procedural time out performed. The oropharynx was anesthetized with topical 1% benzocaine.    Anesthesia was administered by Dr. Smith Robert.  The patient was administered 500 mg of propofol and 30 mg of lidocaine to achieve and maintain moderate conscious sedation.  The patient's heart rate, blood pressure, and oxygen saturation are monitored continuously during the procedure. The period of conscious sedation is 23 minutes, of which I was present face-to-face 100% of this time.   The transesophageal probe was inserted in the esophagus and stomach without difficulty and multiple views were obtained.   COMPLICATIONS:    There were no immediate complications.  KEY FINDINGS:  Severe MR around lateral aspect of recent mitraclip.  Normal LV/RV function.  Full report to follow. Further management per primary team.   [place] Bells T. Audie Box, MD, [place]  [address] [place], [place] [address] [phone]  8:53 AM

## 2022-05-22 NOTE — Transfer of Care (Signed)
Immediate Anesthesia Transfer of Care Note  Patient: Micheal [name]  Procedure(s) Performed: TRANSESOPHAGEAL ECHOCARDIOGRAM (TEE)  Patient Location: PACU and Endoscopy Unit  Anesthesia Type:General  Level of Consciousness: drowsy  Airway & Oxygen Therapy: Patient Spontanous Breathing  Post-op Assessment: Report given to RN and Post -op Vital signs reviewed and stable  Post vital signs: Reviewed and stable  Last Vitals:  Vitals Value Taken Time  BP 102/64   Temp 36.6 C 05/22/22 0903  Pulse 61 05/22/22 0903  Resp 15 05/22/22 0903  SpO2 96 % 05/22/22 0903    Last Pain:  Vitals:   05/22/22 0903  TempSrc: Temporal  PainSc: 0-No pain         Complications: No notable events documented.

## 2022-05-22 NOTE — Anesthesia Preprocedure Evaluation (Addendum)
Anesthesia Evaluation  Patient identified by MRN, date of birth, ID band Patient awake    Reviewed: Allergy & Precautions, NPO status , Patient's Chart, lab work & pertinent test results  Airway Mallampati: I  TM Distance: >3 FB Neck ROM: Full    Dental  (+) Teeth Intact, Dental Advisory Given   Pulmonary former smoker,    breath sounds clear to auscultation       Cardiovascular hypertension, Pt. on medications and Pt. on home beta blockers + CAD, + CABG and +CHF   Rhythm:Regular Rate:Normal + Systolic murmurs - s/p Mitral Clip  Echo: 1. There is a Mitra-Clip XTW Clip (A2/P2) present in the mitral position.  Procedure Date: 05/09/2021. The mitral clip appears to be well-seated at  A2/P2 with eccentric posteriorly directed mitral regurgiation.  2. Right ventricular systolic function is moderately reduced. The right  ventricular size is moderately enlarged. There is severely elevated  pulmonary artery systolic pressure.  3. Left ventricular ejection fraction by 3D volume is 54 %. The left  ventricle has low normal function. The left ventricle demonstrates global  hypokinesis. There is mild asymmetric left ventricular hypertrophy of the  septal segment. Left ventricular  diastolic parameters are indeterminate.  4. The aortic valve is tricuspid. Aortic valve regurgitation is not  visualized. No aortic stenosis is present.  5. The inferior vena cava is dilated in size with <50% respiratory  variability, suggesting right atrial pressure of 15 mmHg.  6. Tricuspid valve regurgitation is mild to moderate.  7. Left atrial size was severely dilated.  8. Right atrial size was severely dilated.    Neuro/Psych negative neurological ROS  negative psych ROS   GI/Hepatic negative GI ROS, Neg liver ROS,   Endo/Other  diabetes, Type 2, Oral Hypoglycemic Agents  Renal/GU negative Renal ROS     Musculoskeletal negative  musculoskeletal ROS (+)   Abdominal   Peds  Hematology   Anesthesia Other Findings   Reproductive/Obstetrics                            Anesthesia Physical Anesthesia Plan  ASA: 3  Anesthesia Plan: MAC   Post-op Pain Management:    Induction: Intravenous  PONV Risk Score and Plan: 0 and Propofol infusion  Airway Management Planned: Natural Airway and Simple Face Mask  Additional Equipment: None  Intra-op Plan:   Post-operative Plan:   Informed Consent: I have reviewed the patients History and Physical, chart, labs and discussed the procedure including the risks, benefits and alternatives for the proposed anesthesia with the patient or authorized representative who has indicated his/her understanding and acceptance.     Dental advisory given  Plan Discussed with: CRNA  Anesthesia Plan Comments:        Anesthesia Quick Evaluation

## 2022-05-22 NOTE — Interval H&P Note (Signed)
History and Physical Interval Note:  05/22/2022 7:51 AM  Micheal [name]  has presented today for surgery, with the diagnosis of MITRAL REGURGITATION.  The various methods of treatment have been discussed with the patient and family. After consideration of risks, benefits and other options for treatment, the patient has consented to  Procedure(s): TRANSESOPHAGEAL ECHOCARDIOGRAM (TEE) (N/A) as a surgical intervention.  The patient's history has been reviewed, patient examined, no change in status, stable for surgery.  I have reviewed the patient's chart and labs.  Questions were answered to the patient's satisfaction.    S/p mitra clip. Severe MR remains. NPO for TEE.   Micheal Bells T. Audie Box, MD, [place]  [address], [place], [place] [address] [phone]  7:51 AM

## 2022-05-22 NOTE — Discharge Instructions (Signed)

## 2022-05-23 ENCOUNTER — Ambulatory Visit: Payer: Medicare Other | Attending: Cardiovascular Disease | Admitting: Cardiovascular Disease

## 2022-05-23 ENCOUNTER — Other Ambulatory Visit: Payer: Self-pay

## 2022-05-23 ENCOUNTER — Encounter: Payer: Self-pay | Admitting: Cardiovascular Disease

## 2022-05-23 ENCOUNTER — Telehealth: Payer: Self-pay | Admitting: Cardiology

## 2022-05-23 VITALS — BP 110/70 | HR 67 | Ht 68.0 in | Wt 150.8 lb

## 2022-05-23 DIAGNOSIS — I2510 Atherosclerotic heart disease of native coronary artery without angina pectoris: Secondary | ICD-10-CM

## 2022-05-23 DIAGNOSIS — I340 Nonrheumatic mitral (valve) insufficiency: Secondary | ICD-10-CM | POA: Diagnosis not present

## 2022-05-23 MED ORDER — AMOXICILLIN 500 MG PO TABS: ORAL_TABLET | ORAL | 5 refills | Status: DC

## 2022-05-23 NOTE — Progress Notes (Signed)
Cardiology Office Note:    Date:  05/23/2022   ID:  Micheal [name], DOB 1940-12-14, MRN [id number]  PCP:  [name], [place] [facility] Cardiologist:  Sinclair Grooms, MD     Referring MD: [name],*   Chief Complaint  Patient presents with   Shortness of Breath    History of Present Illness:    Micheal [name] is a 81 y.o. male with a hx of ischemic heart disease status post remote CABG, type 2 diabetes, persistent atrial fibrillation, and severe mitral regurgitation.  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  He did well in the initial months after MitraClip, but developed recurrent shortness of breath in January of this year.  An echo at that time showed mild to moderate mitral regurgitation and continued medical therapy was recommended.  He was started on furosemide with a good initial response.  He did well for several months but again developed recurrent shortness of breath about 8 weeks ago.  He reports decreased energy and progressive exertional dyspnea.  He is short of breath with walking at a brisk pace or walking up any hill.  He does okay if he walks at a very slow pace on flat ground.  He denies orthopnea, PND, or leg swelling.  No chest pain or pressure.  He had a repeat echo May 05, 2022 that demonstrated normal LV function with worsening of his mitral regurgitation.  A TEE was recommended because of his progressive symptoms and change on surface echo.  This confirmed severe eccentric mitral regurgitation with a stable MitraClip device in the A2/P2 position, but the residual MR arises lateral to the clip and an area of prolapse.  Past Medical History:  Diagnosis Date   CAD (coronary artery disease)    with Cabg 2008   Claudication ([place])     Diabetes ([place])    HTN (hypertension)    Hypercholesteremia    Hyperlipidemia    S/P mitral valve clip implantation 05/09/2021   one XTW clip positioned on A2/P2 per Dr. [name]    Past Surgical History:  Procedure Laterality Date   ABDOMINAL AORTOGRAM W/LOWER EXTREMITY N/A 09/22/2018   Procedure: ABDOMINAL AORTOGRAM W/LOWER EXTREMITY;  Surgeon: [name], MD;  Location: [facility];  Service: Cardiovascular;  Laterality: N/A;   CARDIAC CATHETERIZATION  04/03/2021   CARDIOVERSION N/A 02/26/2021   Procedure: CARDIOVERSION;  Surgeon: [name], MD;  Location: [facility] ENDOSCOPY;  Service: Cardiovascular;  Laterality: N/A;   CORONARY ARTERY BYPASS GRAFT  2008   Coronary bypass surgery     2008   EYE SURGERY Bilateral    laser surgery by Dr. Rodena Piety   MITRAL VALVE REPAIR N/A 05/09/2021   Procedure: MITRAL VALVE REPAIR;  Surgeon: [name], MD;  Location: [facility];  Service: Cardiovascular;  Laterality: N/A;   RIGHT/LEFT HEART CATH AND CORONARY/GRAFT ANGIOGRAPHY N/A 04/03/2021   Procedure: RIGHT/LEFT HEART CATH AND CORONARY/GRAFT ANGIOGRAPHY;  Surgeon: Belva Crome, MD;  Location: [facility];  Service: Cardiovascular;  Laterality: N/A;   TEE WITHOUT CARDIOVERSION N/A 02/26/2021   Procedure: TRANSESOPHAGEAL ECHOCARDIOGRAM (TEE);  Surgeon: [name], MD;  Location: [facility] ENDOSCOPY;  Service: Cardiovascular;  Laterality: N/A;   TEE WITHOUT CARDIOVERSION N/A 05/09/2021   Procedure: TRANSESOPHAGEAL  ECHOCARDIOGRAM (TEE);  Surgeon: [name], MD;  Location: [facility];  Service: Cardiovascular;  Laterality: N/A;    Current Medications: Current Meds  Medication Sig   Cholecalciferol (VITAMIN D) 50 MCG (2000 UT) tablet Take 2,000 Units by mouth in the morning and at bedtime.   CINNAMON PO Take 1,000 mg by mouth daily.   Coenzyme Q10 (CO Q-10) 200 MG CAPS Take 200 mg by mouth daily.    dorzolamide-timolol (COSOPT) 22.3-6.8  MG/ML ophthalmic solution Place 1 drop into both eyes 2 (two) times daily.   ELIQUIS 5 MG TABS tablet TAKE 1 TABLET BY MOUTH TWICE A DAY   furosemide (LASIX) 20 MG tablet TAKE 1 TABLET BY MOUTH EVERY DAY   Glucosamine-Chondroitin (GLUCOSAMINE CHONDR COMPLEX PO) Take 1 tablet by mouth daily.   isosorbide mononitrate (IMDUR) 30 MG 24 hr tablet TAKE 1 TABLET BY MOUTH EVERY DAY   KLOR-CON M20 20 MEQ tablet TAKE 1 TABLET BY MOUTH EVERY DAY   levOCARNitine (L-CARNITINE) 500 MG TABS Take 500 mg by mouth in the morning.   losartan-hydrochlorothiazide (HYZAAR) 50-12.5 MG tablet TAKE 1 TABLET BY MOUTH EVERY DAY   metFORMIN (GLUCOPHAGE) 1000 MG tablet Take 1,000 mg by mouth at bedtime.    metoprolol succinate (TOPROL-XL) 25 MG 24 hr tablet TAKE 1 TABLET (25 MG TOTAL) BY MOUTH DAILY.   Misc Natural Products (NF FORMULAS TESTOSTERONE) CAPS Take 1 capsule by mouth in the morning.   Multiple Vitamins-Minerals (PRESERVISION AREDS 2 PO) Take 1 capsule by mouth 2 (two) times daily.   Omega-3 Fatty Acids (FISH OIL PO) Take 1 capsule by mouth daily.   pravastatin (PRAVACHOL) 20 MG tablet Take 20 mg by mouth at bedtime.   Propylene Glycol (SYSTANE BALANCE) 0.6 % SOLN Place 1 drop into both eyes daily as needed (dry eyes).   QUERCETIN PO Take 1 tablet by mouth daily.   vitamin B-12 (CYANOCOBALAMIN) 1000 MCG tablet Take 1,000 mcg by mouth daily.   vitamin E 400 UNIT capsule Take 400 Units by mouth daily.   VYZULTA 0.024 % SOLN Place 1 drop into the right eye at bedtime.   [DISCONTINUED] amoxicillin (AMOXIL) 500 MG tablet TAKE 4 TABLETS ONE HOUR PRIOR TO DENTAL APPOINTMENT     Allergies:   Patient has no known allergies.   Social History   Socioeconomic History   Marital status: Widowed    Spouse name: Not on file   Number of children: Not on file   Years of education: Not on file   Highest education level: Not on file  Occupational History   Not on file  Tobacco Use   Smoking status: Former   Smokeless  tobacco: Never  Vaping Use   Vaping Use: Never used  Substance and Sexual Activity   Alcohol use: Not Currently    Alcohol/week: 1.0 standard drink of alcohol    Types: 1 Glasses of wine per week   Drug use: No   Sexual activity: Yes  Other Topics Concern   Not on file  Social History Narrative   Not on file   Social Determinants of Health   Financial Resource Strain: Not on file  Food Insecurity: Not on file  Transportation Needs: Not on file  Physical Activity: Not on file  Stress: Not on file  Social Connections: Not on file     Family History: The patient's family history includes Healthy in his sister, sister, and sister; Heart attack in his maternal grandfather; Heart disease in his  father.  ROS:   Please see the history of present illness.    All other systems reviewed and are negative.  EKGs/Labs/Other Studies Reviewed:    The following studies were reviewed today: Echo 05-05-2022: 1. There is a Mitra-Clip XTW Clip (A2/P2) present in the mitral position.  Procedure Date: 05/09/2021. The mitral clip appears to be well-seated at  A2/P2 with eccentric posteriorly directed mitral regurgiation.   2. Right ventricular systolic function is moderately reduced. The right  ventricular size is moderately enlarged. There is severely elevated  pulmonary artery systolic pressure.   3. Left ventricular ejection fraction by 3D volume is 54 %. The left  ventricle has low normal function. The left ventricle demonstrates global  hypokinesis. There is mild asymmetric left ventricular hypertrophy of the  septal segment. Left ventricular  diastolic parameters are indeterminate.   4. The aortic valve is tricuspid. Aortic valve regurgitation is not  visualized. No aortic stenosis is present.   5. The inferior vena cava is dilated in size with <50% respiratory  variability, suggesting right atrial pressure of 15 mmHg.   6. Tricuspid valve regurgitation is mild to moderate.   7. Left  atrial size was severely dilated.   8. Right atrial size was severely dilated.   Comparison(s): A prior study was performed on 08/19/2021. Ef is now low  normal with worsened mitral regurgitation.   TEE 05-22-2022: 1. Mitral edge to edge repair with one XTW clip positioned on A2-P2.  There is severe residual MR related to persistent bileaflet prolapse of  the lateral A1-P1 segments. There does not appear to be any flail  segments. There is systolic reversal of flow  in the left upper pulmonary vein. 2D PISA radius 0.8 cm, 2D ERO 0.28 cm2,  R vol 51 cc, RF 62%. 3D VCA 0.73 cm2. All consistent with severe MR. PMVL  14 mm. Medial orifice and lateral orifice 2 mmHG. 3D MVA of medial orifice  3.57 cm2 and lateral orifice  1.10 cm2. The mitral valve is abnormal. Severe mitral valve regurgitation.  There is a present in the mitral position. Procedure Date: 05/10/2021.   2. Left ventricular ejection fraction, by estimation, is 60 to 65%. The  left ventricle has low normal function. The left ventricle has no regional  wall motion abnormalities.   3. Right ventricular systolic function is mildly reduced. The right  ventricular size is moderately enlarged.   4. Left atrial size was severely dilated. No left atrial/left atrial  appendage thrombus was detected.   5. Right atrial size was severely dilated.   6. Tricuspid valve regurgitation is moderate to severe.   7. The aortic valve is tricuspid. Aortic valve regurgitation is trivial.  No aortic stenosis is present.   8. There is mild (Grade II) layered plaque involving the descending  aorta.   EKG:  EKG is not ordered today.    Recent Labs: 08/06/2021: BNP 199.1 11/07/2021: ALT 14; NT-Pro BNP 4,141 05/08/2022: BUN 25; Creatinine, Ser 1.35; Hemoglobin 13.6; Platelets 173; Potassium 4.6; Sodium 143  Recent Lipid Panel    Component Value Date/Time   CHOL 130 02/25/2021 0249   CHOL 86 (L) 10/10/2019 0744   TRIG 102 02/25/2021 0249   HDL 35  (L) 02/25/2021 0249   HDL 44 10/10/2019 0744   CHOLHDL 3.7 02/25/2021 0249   VLDL 20 02/25/2021 0249   LDLCALC 75 02/25/2021 0249   LDLCALC 24 10/10/2019 0744     Risk Assessment/Calculations:    CHA2DS2-VASc Score =  6   This indicates a 9.7% annual risk of stroke. The patient's score is based upon: CHF History: 1 HTN History: 1 Diabetes History: 1 Stroke History: 0 Vascular Disease History: 1 Age Score: 2 Gender Score: 0    STS Risk Calculation:  Procedure Type: Isolated MVR Perioperative Outcome Estimate % Operative Mortality 6.54% Morbidity & Mortality 19.1% Stroke 3.32% Renal Failure 2.55% Reoperation 8.32% Prolonged Ventilation 9.75% Deep Sternal Wound Infection 0.031% [facility] Hospital Stay (>14 days) 9% Short Hospital Stay (<6 days)* 21.4%  Procedure Type: Isolated MVr Perioperative Outcome Estimate % Operative Mortality 1.88% Morbidity & Mortality 15.1% Stroke 4.67% Renal Failure 1.53% Reoperation 4.4% Prolonged Ventilation 8.36% Deep Sternal Wound Infection 0% [place] [facility] Hospital Stay (>14 days) 4.87% Short Hospital Stay (<6 days)* 41.5%  Physical Exam:    VS:  BP 110/70   Pulse 67   Ht 5\' 8"  (1.727 m)   Wt 150 lb 12.8 oz (68.4 kg)   SpO2 96%   BMI 22.93 kg/m     Wt Readings from Last 3 Encounters:  05/23/22 150 lb 12.8 oz (68.4 kg)  05/22/22 145 lb (65.8 kg)  05/05/22 146 lb 8 oz (66.5 kg)     GEN:  Well nourished, well developed pleasant elderly male in no acute distress HEENT: Normal NECK: No JVD; No carotid bruits LYMPHATICS: No lymphadenopathy CARDIAC: Irregularly irregular with a 3/6 holosystolic murmur at the apex RESPIRATORY:  Clear to auscultation without rales, wheezing or rhonchi  ABDOMEN: Soft, non-tender, non-distended MUSCULOSKELETAL:  No edema; No deformity  SKIN: Warm and dry NEUROLOGIC:  Alert and oriented x 3 PSYCHIATRIC:  Normal affect   ASSESSMENT:    1. Nonrheumatic mitral valve insufficiency    PLAN:    In order of  problems listed above:  The patient has recurrent severe mitral regurgitation 1 year after transcatheter edge-to-edge mitral valve repair.  I have reviewed his echocardiogram, TEE, and discussed his case with colleagues.  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  This is associated with New York Heart Association functional class III symptoms of progressive dyspnea with exertion and fatigue.  Treatment options include continued medical therapy, repeat transcatheter therapy (TEER), or conventional surgery.  Based on the patient's age and comorbid medical conditions with prior CABG, repeat catheter-based treatment seems appropriate.  I will review his case with our multidisciplinary heart valve team which includes cardiac surgery.  His initial cardiac surgical consultation last year deemed him high risk for surgery and he was felt to be better served with MitraClip at that time.  The patient is counseled regarding the risks, indications, and alternatives to MitraClip at length today.  He understands the risks include vascular injury, bleeding, cardiac injury, hemopericardium, need for emergency pericardiocentesis, need for emergency surgery, stroke, myocardial infarction, device embolization, single leaflet device attachment, mitral valve injury, arrhythmia, and death.  He understands these risks occur at a low incidence of 1 to 2%.  He would like to proceed at the next available time.  He will continue his current medications and will hold apixaban 24 hours before the procedure.  Again, I will review his case with our multidisciplinary heart valve team next week before his procedure is performed.           Medication Adjustments/Labs and Tests Ordered: Current medicines are reviewed at length with the patient today.  Concerns regarding medicines are outlined above.  No orders of the defined types were placed in this encounter.  Meds ordered  this encounter  Medications   amoxicillin (AMOXIL) 500 MG tablet    Sig: Take 4 tablets (2,000 mg) one hour prior to all dental visits.    Dispense:  30 tablet    Refill:  5    Patient Instructions  Medication Instructions:  Your physician recommends that you continue on your current medications as directed. Please refer to the Current Medication list given to you today.  *If you need a refill on your cardiac medications before your next appointment, please call your pharmacy*   Lab Work: NONE If you have labs (blood work) drawn today and your tests are completely normal, you will receive your results only by: [place] (if you have MyChart) OR A paper copy in the mail If you have any lab test that is abnormal or we need to change your treatment, we will call you to review the results.   Testing/Procedures: MitraClip procedure   Follow-Up: At [facility], you and your health needs are our priority.  As part of our continuing mission to provide you with exceptional heart care, we have created designated Provider Care Teams.  These Care Teams include your primary Cardiologist (physician) and Advanced Practice Providers (APPs -  Physician Assistants and Nurse Practitioners) who all work together to provide you with the care you need, when you need it.  We recommend signing up for the patient portal called "MyChart".  Sign up information is provided on this After Visit Summary.  MyChart is used to connect with patients for Virtual Visits (Telemedicine).  Patients are able to view lab/test results, encounter notes, upcoming appointments, etc.  Non-urgent messages can be sent to your provider as well.   To learn more about what you can do with MyChart, go to [email or web address].    Your next appointment:   Structural Team will follow-up  The format for your next appointment:   In Person  Provider:   [name], MD      Important Information About  S[place]         Signed, [name], MD  05/23/2022 4:32 PM    [place]

## 2022-05-23 NOTE — Telephone Encounter (Signed)
For patient JA: This is Primary MR  This valve looks suitable for another MitraClip implant. The original MitraClip implant is well seated and stable. In both the Bicaval and SAXB views, the fossa looks reasonable for a transseptal puncture. TR is noted. The LA dimensions appear large enough for steering and straddle of the Clip Delivery System. 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 Gradient measured at 2 mmHg (64 BPM); MVA measured 4.67 cm2. Based on the location of the defect, I'd like to suggest an NT.

## 2022-05-23 NOTE — Patient Instructions (Signed)
Medication Instructions:  Your physician recommends that you continue on your current medications as directed. Please refer to the Current Medication list given to you today.  *If you need a refill on your cardiac medications before your next appointment, please call your pharmacy*   Lab Work: NONE If you have labs (blood work) drawn today and your tests are completely normal, you will receive your results only by: [place] (if you have MyChart) OR A paper copy in the mail If you have any lab test that is abnormal or we need to change your treatment, we will call you to review the results.   Testing/Procedures: MitraClip procedure   Follow-Up: At [facility], you and your health needs are our priority.  As part of our continuing mission to provide you with exceptional heart care, we have created designated Provider Care Teams.  These Care Teams include your primary Cardiologist (physician) and Advanced Practice Providers (APPs -  Physician Assistants and Nurse Practitioners) who all work together to provide you with the care you need, when you need it.  We recommend signing up for the patient portal called "MyChart".  Sign up information is provided on this After Visit Summary.  MyChart is used to connect with patients for Virtual Visits (Telemedicine).  Patients are able to view lab/test results, encounter notes, upcoming appointments, etc.  Non-urgent messages can be sent to your provider as well.   To learn more about what you can do with MyChart, go to [email or web address].    Your next appointment:   Structural Team will follow-up  The format for your next appointment:   In Person  Provider:   [name], MD      Important Information About [place]

## 2022-05-23 NOTE — H&P (View-Only) (Signed)
Cardiology Office Note:    Date:  05/23/2022   ID:  Micheal [name], DOB 1940-12-14, MRN [id number]  PCP:  [name], [place] [facility] Cardiologist:  Sinclair Grooms, MD     Referring MD: [name],*   Chief Complaint  Patient presents with   Shortness of Breath    History of Present Illness:    Micheal [name] is a 81 y.o. male with a hx of ischemic heart disease status post remote CABG, type 2 diabetes, persistent atrial fibrillation, and severe mitral regurgitation.  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  He did well in the initial months after MitraClip, but developed recurrent shortness of breath in January of this year.  An echo at that time showed mild to moderate mitral regurgitation and continued medical therapy was recommended.  He was started on furosemide with a good initial response.  He did well for several months but again developed recurrent shortness of breath about 8 weeks ago.  He reports decreased energy and progressive exertional dyspnea.  He is short of breath with walking at a brisk pace or walking up any hill.  He does okay if he walks at a very slow pace on flat ground.  He denies orthopnea, PND, or leg swelling.  No chest pain or pressure.  He had a repeat echo May 05, 2022 that demonstrated normal LV function with worsening of his mitral regurgitation.  A TEE was recommended because of his progressive symptoms and change on surface echo.  This confirmed severe eccentric mitral regurgitation with a stable MitraClip device in the A2/P2 position, but the residual MR arises lateral to the clip and an area of prolapse.  Past Medical History:  Diagnosis Date   CAD (coronary artery disease)    with Cabg 2008   Claudication ([place])     Diabetes ([place])    HTN (hypertension)    Hypercholesteremia    Hyperlipidemia    S/P mitral valve clip implantation 05/09/2021   one XTW clip positioned on A2/P2 per Dr. [name]    Past Surgical History:  Procedure Laterality Date   ABDOMINAL AORTOGRAM W/LOWER EXTREMITY N/A 09/22/2018   Procedure: ABDOMINAL AORTOGRAM W/LOWER EXTREMITY;  Surgeon: [name], MD;  Location: [facility];  Service: Cardiovascular;  Laterality: N/A;   CARDIAC CATHETERIZATION  04/03/2021   CARDIOVERSION N/A 02/26/2021   Procedure: CARDIOVERSION;  Surgeon: [name], MD;  Location: [facility] ENDOSCOPY;  Service: Cardiovascular;  Laterality: N/A;   CORONARY ARTERY BYPASS GRAFT  2008   Coronary bypass surgery     2008   EYE SURGERY Bilateral    laser surgery by Dr. Rodena Piety   MITRAL VALVE REPAIR N/A 05/09/2021   Procedure: MITRAL VALVE REPAIR;  Surgeon: [name], MD;  Location: [facility];  Service: Cardiovascular;  Laterality: N/A;   RIGHT/LEFT HEART CATH AND CORONARY/GRAFT ANGIOGRAPHY N/A 04/03/2021   Procedure: RIGHT/LEFT HEART CATH AND CORONARY/GRAFT ANGIOGRAPHY;  Surgeon: Belva Crome, MD;  Location: [facility];  Service: Cardiovascular;  Laterality: N/A;   TEE WITHOUT CARDIOVERSION N/A 02/26/2021   Procedure: TRANSESOPHAGEAL ECHOCARDIOGRAM (TEE);  Surgeon: [name], MD;  Location: [facility] ENDOSCOPY;  Service: Cardiovascular;  Laterality: N/A;   TEE WITHOUT CARDIOVERSION N/A 05/09/2021   Procedure: TRANSESOPHAGEAL  ECHOCARDIOGRAM (TEE);  Surgeon: [name], MD;  Location: [facility];  Service: Cardiovascular;  Laterality: N/A;    Current Medications: Current Meds  Medication Sig   Cholecalciferol (VITAMIN D) 50 MCG (2000 UT) tablet Take 2,000 Units by mouth in the morning and at bedtime.   CINNAMON PO Take 1,000 mg by mouth daily.   Coenzyme Q10 (CO Q-10) 200 MG CAPS Take 200 mg by mouth daily.    dorzolamide-timolol (COSOPT) 22.3-6.8  MG/ML ophthalmic solution Place 1 drop into both eyes 2 (two) times daily.   ELIQUIS 5 MG TABS tablet TAKE 1 TABLET BY MOUTH TWICE A DAY   furosemide (LASIX) 20 MG tablet TAKE 1 TABLET BY MOUTH EVERY DAY   Glucosamine-Chondroitin (GLUCOSAMINE CHONDR COMPLEX PO) Take 1 tablet by mouth daily.   isosorbide mononitrate (IMDUR) 30 MG 24 hr tablet TAKE 1 TABLET BY MOUTH EVERY DAY   KLOR-CON M20 20 MEQ tablet TAKE 1 TABLET BY MOUTH EVERY DAY   levOCARNitine (L-CARNITINE) 500 MG TABS Take 500 mg by mouth in the morning.   losartan-hydrochlorothiazide (HYZAAR) 50-12.5 MG tablet TAKE 1 TABLET BY MOUTH EVERY DAY   metFORMIN (GLUCOPHAGE) 1000 MG tablet Take 1,000 mg by mouth at bedtime.    metoprolol succinate (TOPROL-XL) 25 MG 24 hr tablet TAKE 1 TABLET (25 MG TOTAL) BY MOUTH DAILY.   Misc Natural Products (NF FORMULAS TESTOSTERONE) CAPS Take 1 capsule by mouth in the morning.   Multiple Vitamins-Minerals (PRESERVISION AREDS 2 PO) Take 1 capsule by mouth 2 (two) times daily.   Omega-3 Fatty Acids (FISH OIL PO) Take 1 capsule by mouth daily.   pravastatin (PRAVACHOL) 20 MG tablet Take 20 mg by mouth at bedtime.   Propylene Glycol (SYSTANE BALANCE) 0.6 % SOLN Place 1 drop into both eyes daily as needed (dry eyes).   QUERCETIN PO Take 1 tablet by mouth daily.   vitamin B-12 (CYANOCOBALAMIN) 1000 MCG tablet Take 1,000 mcg by mouth daily.   vitamin E 400 UNIT capsule Take 400 Units by mouth daily.   VYZULTA 0.024 % SOLN Place 1 drop into the right eye at bedtime.   [DISCONTINUED] amoxicillin (AMOXIL) 500 MG tablet TAKE 4 TABLETS ONE HOUR PRIOR TO DENTAL APPOINTMENT     Allergies:   Patient has no known allergies.   Social History   Socioeconomic History   Marital status: Widowed    Spouse name: Not on file   Number of children: Not on file   Years of education: Not on file   Highest education level: Not on file  Occupational History   Not on file  Tobacco Use   Smoking status: Former   Smokeless  tobacco: Never  Vaping Use   Vaping Use: Never used  Substance and Sexual Activity   Alcohol use: Not Currently    Alcohol/week: 1.0 standard drink of alcohol    Types: 1 Glasses of wine per week   Drug use: No   Sexual activity: Yes  Other Topics Concern   Not on file  Social History Narrative   Not on file   Social Determinants of Health   Financial Resource Strain: Not on file  Food Insecurity: Not on file  Transportation Needs: Not on file  Physical Activity: Not on file  Stress: Not on file  Social Connections: Not on file     Family History: The patient's family history includes Healthy in his sister, sister, and sister; Heart attack in his maternal grandfather; Heart disease in his  father.  ROS:   Please see the history of present illness.    All other systems reviewed and are negative.  EKGs/Labs/Other Studies Reviewed:    The following studies were reviewed today: Echo 05-05-2022: 1. There is a Mitra-Clip XTW Clip (A2/P2) present in the mitral position.  Procedure Date: 05/09/2021. The mitral clip appears to be well-seated at  A2/P2 with eccentric posteriorly directed mitral regurgiation.   2. Right ventricular systolic function is moderately reduced. The right  ventricular size is moderately enlarged. There is severely elevated  pulmonary artery systolic pressure.   3. Left ventricular ejection fraction by 3D volume is 54 %. The left  ventricle has low normal function. The left ventricle demonstrates global  hypokinesis. There is mild asymmetric left ventricular hypertrophy of the  septal segment. Left ventricular  diastolic parameters are indeterminate.   4. The aortic valve is tricuspid. Aortic valve regurgitation is not  visualized. No aortic stenosis is present.   5. The inferior vena cava is dilated in size with <50% respiratory  variability, suggesting right atrial pressure of 15 mmHg.   6. Tricuspid valve regurgitation is mild to moderate.   7. Left  atrial size was severely dilated.   8. Right atrial size was severely dilated.   Comparison(s): A prior study was performed on 08/19/2021. Ef is now low  normal with worsened mitral regurgitation.   TEE 05-22-2022: 1. Mitral edge to edge repair with one XTW clip positioned on A2-P2.  There is severe residual MR related to persistent bileaflet prolapse of  the lateral A1-P1 segments. There does not appear to be any flail  segments. There is systolic reversal of flow  in the left upper pulmonary vein. 2D PISA radius 0.8 cm, 2D ERO 0.28 cm2,  R vol 51 cc, RF 62%. 3D VCA 0.73 cm2. All consistent with severe MR. PMVL  14 mm. Medial orifice and lateral orifice 2 mmHG. 3D MVA of medial orifice  3.57 cm2 and lateral orifice  1.10 cm2. The mitral valve is abnormal. Severe mitral valve regurgitation.  There is a present in the mitral position. Procedure Date: 05/10/2021.   2. Left ventricular ejection fraction, by estimation, is 60 to 65%. The  left ventricle has low normal function. The left ventricle has no regional  wall motion abnormalities.   3. Right ventricular systolic function is mildly reduced. The right  ventricular size is moderately enlarged.   4. Left atrial size was severely dilated. No left atrial/left atrial  appendage thrombus was detected.   5. Right atrial size was severely dilated.   6. Tricuspid valve regurgitation is moderate to severe.   7. The aortic valve is tricuspid. Aortic valve regurgitation is trivial.  No aortic stenosis is present.   8. There is mild (Grade II) layered plaque involving the descending  aorta.   EKG:  EKG is not ordered today.    Recent Labs: 08/06/2021: BNP 199.1 11/07/2021: ALT 14; NT-Pro BNP 4,141 05/08/2022: BUN 25; Creatinine, Ser 1.35; Hemoglobin 13.6; Platelets 173; Potassium 4.6; Sodium 143  Recent Lipid Panel    Component Value Date/Time   CHOL 130 02/25/2021 0249   CHOL 86 (L) 10/10/2019 0744   TRIG 102 02/25/2021 0249   HDL 35  (L) 02/25/2021 0249   HDL 44 10/10/2019 0744   CHOLHDL 3.7 02/25/2021 0249   VLDL 20 02/25/2021 0249   LDLCALC 75 02/25/2021 0249   LDLCALC 24 10/10/2019 0744     Risk Assessment/Calculations:    CHA2DS2-VASc Score =  6   This indicates a 9.7% annual risk of stroke. The patient's score is based upon: CHF History: 1 HTN History: 1 Diabetes History: 1 Stroke History: 0 Vascular Disease History: 1 Age Score: 2 Gender Score: 0    STS Risk Calculation:  Procedure Type: Isolated MVR Perioperative Outcome Estimate % Operative Mortality 6.54% Morbidity & Mortality 19.1% Stroke 3.32% Renal Failure 2.55% Reoperation 8.32% Prolonged Ventilation 9.75% Deep Sternal Wound Infection 0.031% [facility] Hospital Stay (>14 days) 9% Short Hospital Stay (<6 days)* 21.4%  Procedure Type: Isolated MVr Perioperative Outcome Estimate % Operative Mortality 1.88% Morbidity & Mortality 15.1% Stroke 4.67% Renal Failure 1.53% Reoperation 4.4% Prolonged Ventilation 8.36% Deep Sternal Wound Infection 0% [place] [facility] Hospital Stay (>14 days) 4.87% Short Hospital Stay (<6 days)* 41.5%  Physical Exam:    VS:  BP 110/70   Pulse 67   Ht 5\' 8"  (1.727 m)   Wt 150 lb 12.8 oz (68.4 kg)   SpO2 96%   BMI 22.93 kg/m     Wt Readings from Last 3 Encounters:  05/23/22 150 lb 12.8 oz (68.4 kg)  05/22/22 145 lb (65.8 kg)  05/05/22 146 lb 8 oz (66.5 kg)     GEN:  Well nourished, well developed pleasant elderly male in no acute distress HEENT: Normal NECK: No JVD; No carotid bruits LYMPHATICS: No lymphadenopathy CARDIAC: Irregularly irregular with a 3/6 holosystolic murmur at the apex RESPIRATORY:  Clear to auscultation without rales, wheezing or rhonchi  ABDOMEN: Soft, non-tender, non-distended MUSCULOSKELETAL:  No edema; No deformity  SKIN: Warm and dry NEUROLOGIC:  Alert and oriented x 3 PSYCHIATRIC:  Normal affect   ASSESSMENT:    1. Nonrheumatic mitral valve insufficiency    PLAN:    In order of  problems listed above:  The patient has recurrent severe mitral regurgitation 1 year after transcatheter edge-to-edge mitral valve repair.  I have reviewed his echocardiogram, TEE, and discussed his case with colleagues.  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  This is associated with New York Heart Association functional class III symptoms of progressive dyspnea with exertion and fatigue.  Treatment options include continued medical therapy, repeat transcatheter therapy (TEER), or conventional surgery.  Based on the patient's age and comorbid medical conditions with prior CABG, repeat catheter-based treatment seems appropriate.  I will review his case with our multidisciplinary heart valve team which includes cardiac surgery.  His initial cardiac surgical consultation last year deemed him high risk for surgery and he was felt to be better served with MitraClip at that time.  The patient is counseled regarding the risks, indications, and alternatives to MitraClip at length today.  He understands the risks include vascular injury, bleeding, cardiac injury, hemopericardium, need for emergency pericardiocentesis, need for emergency surgery, stroke, myocardial infarction, device embolization, single leaflet device attachment, mitral valve injury, arrhythmia, and death.  He understands these risks occur at a low incidence of 1 to 2%.  He would like to proceed at the next available time.  He will continue his current medications and will hold apixaban 24 hours before the procedure.  Again, I will review his case with our multidisciplinary heart valve team next week before his procedure is performed.           Medication Adjustments/Labs and Tests Ordered: Current medicines are reviewed at length with the patient today.  Concerns regarding medicines are outlined above.  No orders of the defined types were placed in this encounter.  Meds ordered  this encounter  Medications   amoxicillin (AMOXIL) 500 MG tablet    Sig: Take 4 tablets (2,000 mg) one hour prior to all dental visits.    Dispense:  30 tablet    Refill:  5    Patient Instructions  Medication Instructions:  Your physician recommends that you continue on your current medications as directed. Please refer to the Current Medication list given to you today.  *If you need a refill on your cardiac medications before your next appointment, please call your pharmacy*   Lab Work: NONE If you have labs (blood work) drawn today and your tests are completely normal, you will receive your results only by: [place] (if you have MyChart) OR A paper copy in the mail If you have any lab test that is abnormal or we need to change your treatment, we will call you to review the results.   Testing/Procedures: MitraClip procedure   Follow-Up: At [facility], you and your health needs are our priority.  As part of our continuing mission to provide you with exceptional heart care, we have created designated Provider Care Teams.  These Care Teams include your primary Cardiologist (physician) and Advanced Practice Providers (APPs -  Physician Assistants and Nurse Practitioners) who all work together to provide you with the care you need, when you need it.  We recommend signing up for the patient portal called "MyChart".  Sign up information is provided on this After Visit Summary.  MyChart is used to connect with patients for Virtual Visits (Telemedicine).  Patients are able to view lab/test results, encounter notes, upcoming appointments, etc.  Non-urgent messages can be sent to your provider as well.   To learn more about what you can do with MyChart, go to [email or web address].    Your next appointment:   Structural Team will follow-up  The format for your next appointment:   In Person  Provider:   [name], MD      Important Information About  [place]         Signed, [name], MD  05/23/2022 4:32 PM    [place]

## 2022-05-25 ENCOUNTER — Encounter (HOSPITAL_COMMUNITY): Payer: Self-pay | Admitting: Cardiovascular Disease

## 2022-05-26 ENCOUNTER — Encounter (HOSPITAL_COMMUNITY)
Admission: RE | Admit: 2022-05-26 | Discharge: 2022-05-26 | Disposition: A | Discharge status: Home / Self Care | Payer: Medicare Other | Source: Ambulatory Visit | Attending: Cardiovascular Disease | Admitting: Cardiovascular Disease

## 2022-05-26 ENCOUNTER — Ambulatory Visit (HOSPITAL_COMMUNITY)
Admission: RE | Admit: 2022-05-26 | Discharge: 2022-05-26 | Disposition: A | Discharge status: Home / Self Care | Payer: Medicare Other | Source: Ambulatory Visit | Attending: Cardiovascular Disease | Admitting: Cardiovascular Disease

## 2022-05-26 DIAGNOSIS — I2510 Atherosclerotic heart disease of native coronary artery without angina pectoris: Secondary | ICD-10-CM | POA: Diagnosis not present

## 2022-05-26 DIAGNOSIS — Z79899 Other long term (current) drug therapy: Secondary | ICD-10-CM | POA: Diagnosis not present

## 2022-05-26 DIAGNOSIS — I340 Nonrheumatic mitral (valve) insufficiency: Secondary | ICD-10-CM | POA: Diagnosis not present

## 2022-05-26 DIAGNOSIS — Z006 Encounter for examination for normal comparison and control in clinical research program: Secondary | ICD-10-CM | POA: Diagnosis not present

## 2022-05-26 DIAGNOSIS — H35033 Hypertensive retinopathy, bilateral: Secondary | ICD-10-CM | POA: Diagnosis not present

## 2022-05-26 DIAGNOSIS — I10 Essential (primary) hypertension: Secondary | ICD-10-CM | POA: Diagnosis not present

## 2022-05-26 DIAGNOSIS — Z01818 Encounter for other preprocedural examination: Secondary | ICD-10-CM

## 2022-05-26 DIAGNOSIS — I110 Hypertensive heart disease with heart failure: Secondary | ICD-10-CM | POA: Diagnosis not present

## 2022-05-26 DIAGNOSIS — Z954 Presence of other heart-valve replacement: Secondary | ICD-10-CM | POA: Diagnosis not present

## 2022-05-26 DIAGNOSIS — Z20822 Contact with and (suspected) exposure to covid-19: Secondary | ICD-10-CM | POA: Diagnosis not present

## 2022-05-26 DIAGNOSIS — E7800 Pure hypercholesterolemia, unspecified: Secondary | ICD-10-CM | POA: Diagnosis not present

## 2022-05-26 DIAGNOSIS — H353231 Exudative age-related macular degeneration, bilateral, with active choroidal neovascularization: Secondary | ICD-10-CM | POA: Diagnosis not present

## 2022-05-26 DIAGNOSIS — I272 Pulmonary hypertension, unspecified: Secondary | ICD-10-CM | POA: Diagnosis not present

## 2022-05-26 DIAGNOSIS — I5033 Acute on chronic diastolic (congestive) heart failure: Secondary | ICD-10-CM | POA: Diagnosis not present

## 2022-05-26 DIAGNOSIS — Z7984 Long term (current) use of oral hypoglycemic drugs: Secondary | ICD-10-CM | POA: Diagnosis not present

## 2022-05-26 DIAGNOSIS — Z9889 Other specified postprocedural states: Secondary | ICD-10-CM | POA: Diagnosis present

## 2022-05-26 DIAGNOSIS — Z1152 Encounter for screening for COVID-19: Secondary | ICD-10-CM | POA: Insufficient documentation

## 2022-05-26 DIAGNOSIS — Z7901 Long term (current) use of anticoagulants: Secondary | ICD-10-CM | POA: Diagnosis not present

## 2022-05-26 DIAGNOSIS — I4819 Other persistent atrial fibrillation: Secondary | ICD-10-CM | POA: Diagnosis not present

## 2022-05-26 DIAGNOSIS — E1151 Type 2 diabetes mellitus with diabetic peripheral angiopathy without gangrene: Secondary | ICD-10-CM | POA: Diagnosis not present

## 2022-05-26 DIAGNOSIS — E119 Type 2 diabetes mellitus without complications: Secondary | ICD-10-CM

## 2022-05-26 DIAGNOSIS — Z87891 Personal history of nicotine dependence: Secondary | ICD-10-CM | POA: Diagnosis not present

## 2022-05-26 DIAGNOSIS — I509 Heart failure, unspecified: Secondary | ICD-10-CM | POA: Diagnosis not present

## 2022-05-26 DIAGNOSIS — Z8249 Family history of ischemic heart disease and other diseases of the circulatory system: Secondary | ICD-10-CM | POA: Diagnosis not present

## 2022-05-26 DIAGNOSIS — H43813 Vitreous degeneration, bilateral: Secondary | ICD-10-CM | POA: Diagnosis not present

## 2022-05-26 DIAGNOSIS — H353211 Exudative age-related macular degeneration, right eye, with active choroidal neovascularization: Secondary | ICD-10-CM | POA: Diagnosis not present

## 2022-05-26 DIAGNOSIS — Z951 Presence of aortocoronary bypass graft: Secondary | ICD-10-CM | POA: Diagnosis not present

## 2022-05-26 LAB — TYPE AND SCREEN
ABO/RH(D): AB POS
Antibody Screen: NEGATIVE

## 2022-05-26 LAB — COMPREHENSIVE METABOLIC PANEL
ALT: 20 U/L (ref 0–44)
AST: 20 U/L (ref 15–41)
Albumin: 3.9 g/dL (ref 3.5–5.0)
Alkaline Phosphatase: 60 U/L (ref 38–126)
Anion gap: 10 (ref 5–15)
BUN: 22 mg/dL (ref 8–23)
CO2: 25 mmol/L (ref 22–32)
Calcium: 9.3 mg/dL (ref 8.9–10.3)
Chloride: 103 mmol/L (ref 98–111)
Creatinine, Ser: 1.29 mg/dL — ABNORMAL HIGH (ref 0.61–1.24)
GFR, Estimated: 55 mL/min — ABNORMAL LOW (ref >60)
Glucose, Bld: 168 mg/dL — ABNORMAL HIGH (ref 70–99)
Potassium: 3.8 mmol/L (ref 3.5–5.1)
Sodium: 138 mmol/L (ref 135–145)
Total Bilirubin: 2.6 mg/dL — ABNORMAL HIGH (ref 0.3–1.2)
Total Protein: 7 g/dL (ref 6.5–8.1)

## 2022-05-26 LAB — URINALYSIS, ROUTINE W REFLEX MICROSCOPIC
Bacteria, UA: NONE SEEN
Bilirubin Urine: NEGATIVE
Glucose, UA: NEGATIVE mg/dL
Hgb urine dipstick: NEGATIVE
Ketones, ur: NEGATIVE mg/dL
Leukocytes,Ua: NEGATIVE
Nitrite: NEGATIVE
Protein, ur: 30 mg/dL — AB
Specific Gravity, Urine: 1.017 (ref 1.005–1.030)
pH: 5 (ref 5.0–8.0)

## 2022-05-26 LAB — SARS CORONAVIRUS 2 (TAT 6-24 HRS): SARS Coronavirus 2: NEGATIVE

## 2022-05-26 LAB — CBC
HCT: 40 % (ref 39.0–52.0)
Hemoglobin: 12.8 g/dL — ABNORMAL LOW (ref 13.0–17.0)
MCH: 34 pg (ref 26.0–34.0)
MCHC: 32 g/dL (ref 30.0–36.0)
MCV: 106.1 fL — ABNORMAL HIGH (ref 80.0–100.0)
Platelets: 150 10*3/uL (ref 150–400)
RBC: 3.77 MIL/uL — ABNORMAL LOW (ref 4.22–5.81)
RDW: 14.7 % (ref 11.5–15.5)
WBC: 5.4 10*3/uL (ref 4.0–10.5)
nRBC: 0 % (ref 0.0–0.2)

## 2022-05-26 LAB — PROTIME-INR
INR: 1.4 — ABNORMAL HIGH (ref 0.8–1.2)
Prothrombin Time: 16.6 seconds — ABNORMAL HIGH (ref 11.4–15.2)

## 2022-05-26 LAB — SURGICAL PCR SCREEN
MRSA, PCR: NEGATIVE
Staphylococcus aureus: NEGATIVE

## 2022-05-26 LAB — BRAIN NATRIURETIC PEPTIDE: B Natriuretic Peptide: 771.5 pg/mL — ABNORMAL HIGH (ref 0.0–100.0)

## 2022-05-26 NOTE — Progress Notes (Signed)
Patient arrived for pre-op labs. Pt denies COVID symptoms or Covid exposure. Pt with no other complaints. Pt reports he has already received medication instructions for surgery. Pt given pre-op CHG soap. Pt with no further questions.

## 2022-05-26 NOTE — Progress Notes (Signed)
Lab called stating that Hgb A1c was not able to be run d/t blood already being used to run CBC and BNP. Karoline Caldwell and Harland German, RN notified. Per both, no need to have patient return for this blood draw prior to surgery.

## 2022-05-27 ENCOUNTER — Encounter (INDEPENDENT_AMBULATORY_CARE_PROVIDER_SITE_OTHER): Payer: Medicare Other | Admitting: Ophthalmology

## 2022-05-27 DIAGNOSIS — I10 Essential (primary) hypertension: Secondary | ICD-10-CM | POA: Diagnosis not present

## 2022-05-27 DIAGNOSIS — H43813 Vitreous degeneration, bilateral: Secondary | ICD-10-CM

## 2022-05-27 DIAGNOSIS — H353231 Exudative age-related macular degeneration, bilateral, with active choroidal neovascularization: Secondary | ICD-10-CM

## 2022-05-27 DIAGNOSIS — H35033 Hypertensive retinopathy, bilateral: Secondary | ICD-10-CM | POA: Diagnosis not present

## 2022-05-27 IMAGING — CT CT ANGIO CHEST
2 of 6 series · 19 of 36 positions shown · IV contrast (omnipaque)
Comparison: Plain film of earlier today.  No prior CT.

CLINICAL DATA: Shortness of breath. Worsening yesterday and today.
Palpitations. Chest tightness.

EXAM:
CT ANGIOGRAPHY CHEST WITH CONTRAST
TECHNIQUE: Multidetector CT imaging of the chest was performed using the
standard protocol during bolus administration of intravenous
contrast. Multiplanar CT image reconstructions and MIPs were
obtained to evaluate the vascular anatomy.
CONTRAST:  100mL OMNIPAQUE IOHEXOL 350 MG/ML SOLN

[Series 7: pe thins · axial · 0.71mm/px · z∈[+1161,+1472]mm · 18 of 494 slices shown]
[im 25/494  lung]
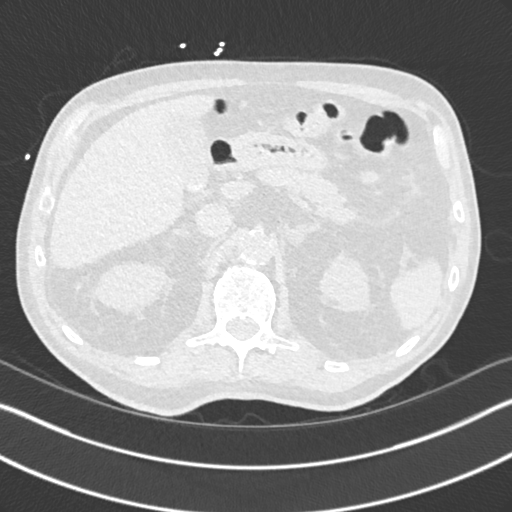
[im 50/494  mediastinal]
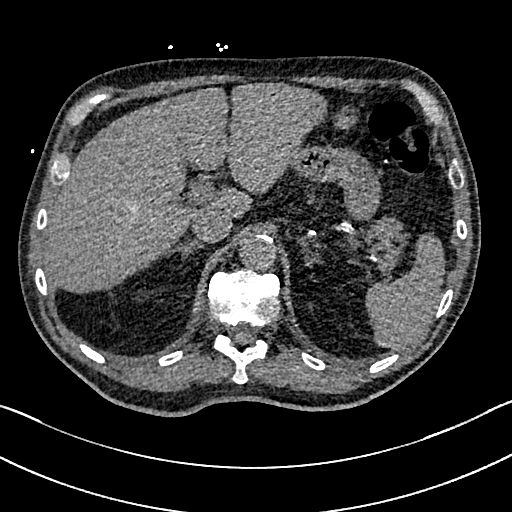
[im 74/494  lung]
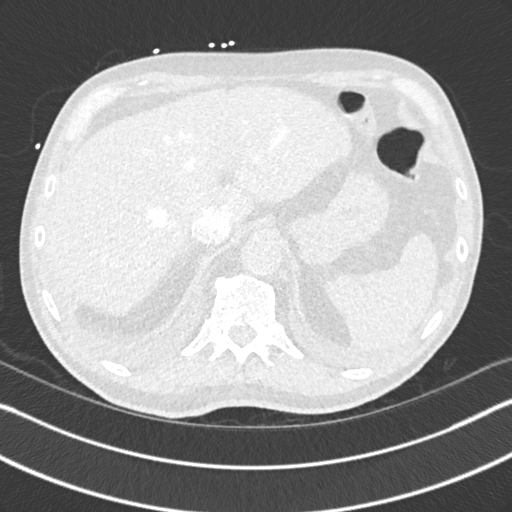
[im 99/494  mediastinal]
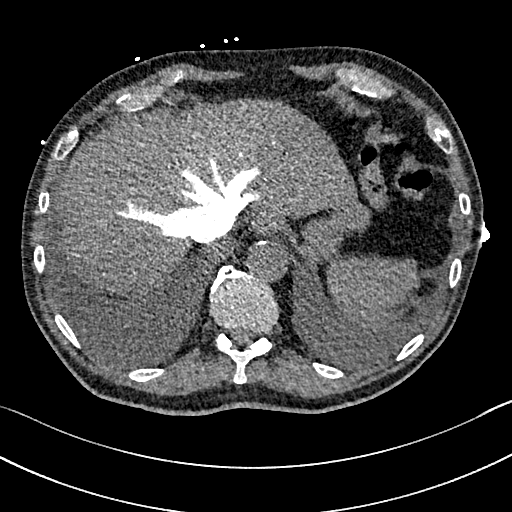
[im 124/494  lung]
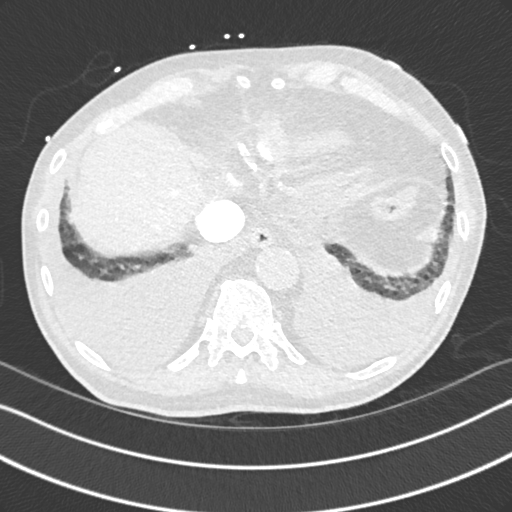
[im 148/494  mediastinal]
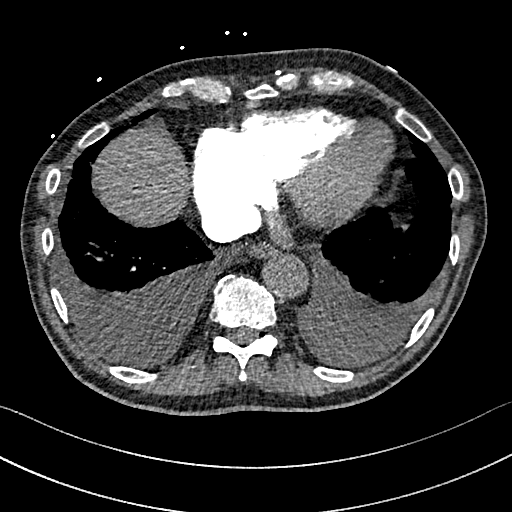
[im 173/494  lung]
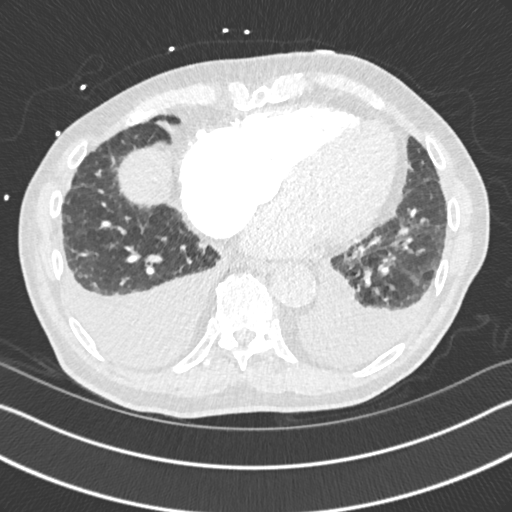
[im 198/494  mediastinal]
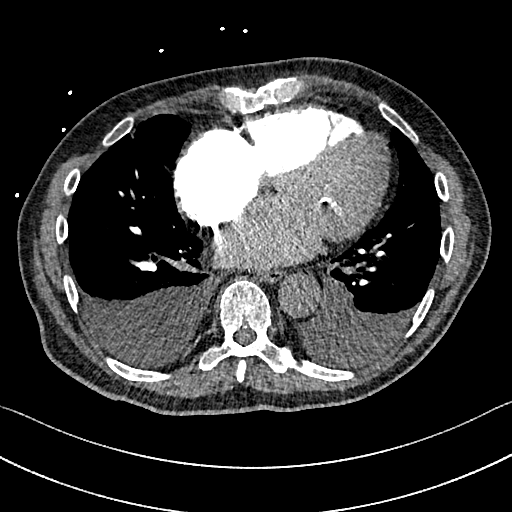
[im 222/494  lung]
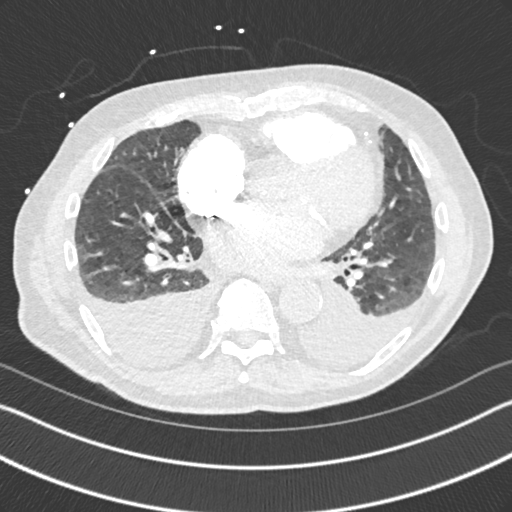
[im 272/494  mediastinal]
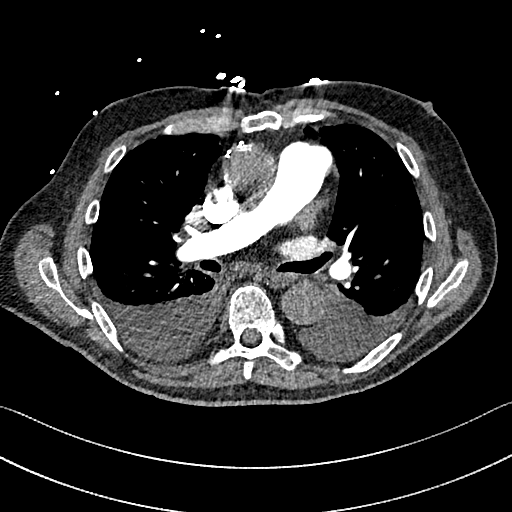
[im 296/494  lung]
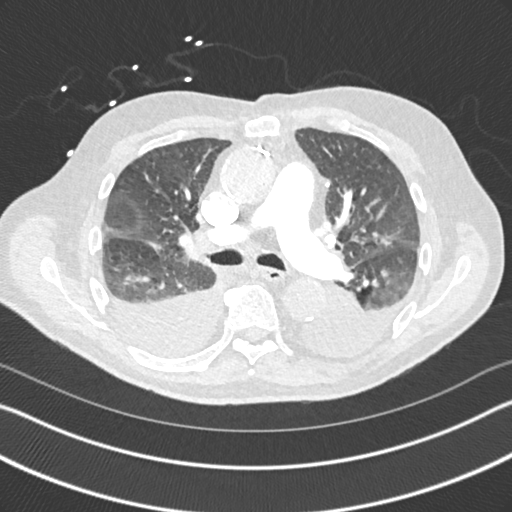
[im 321/494  mediastinal]
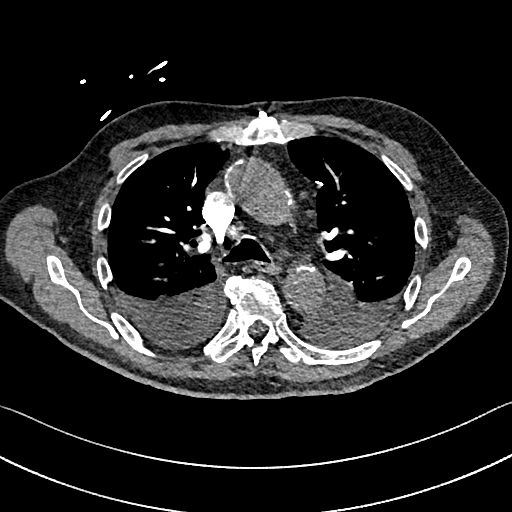
[im 346/494  lung]
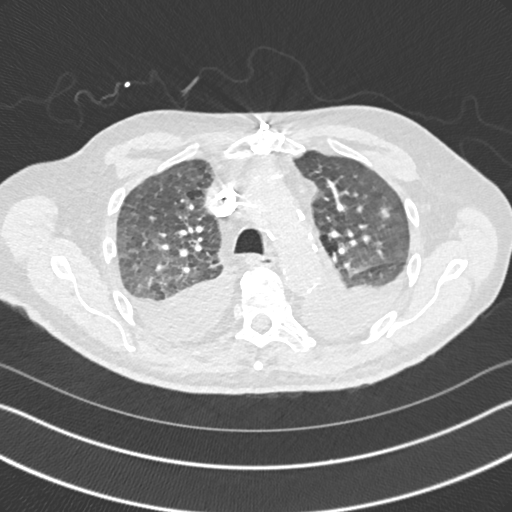
[im 370/494  mediastinal]
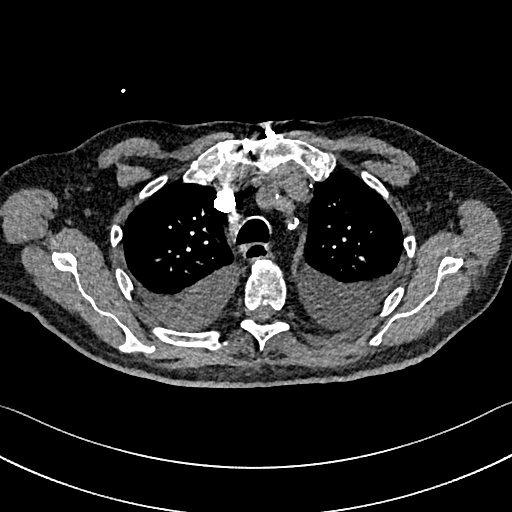
[im 395/494  lung]
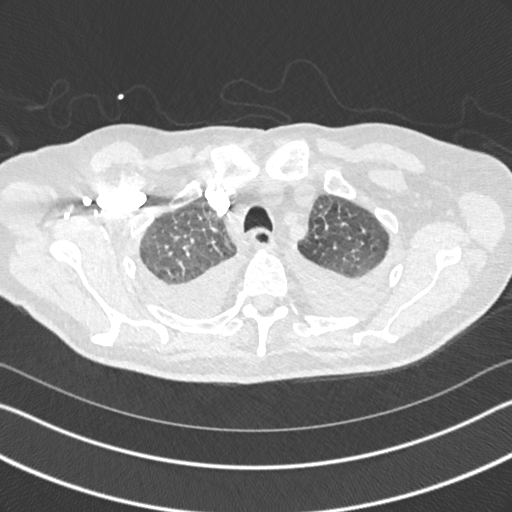
[im 420/494  mediastinal]
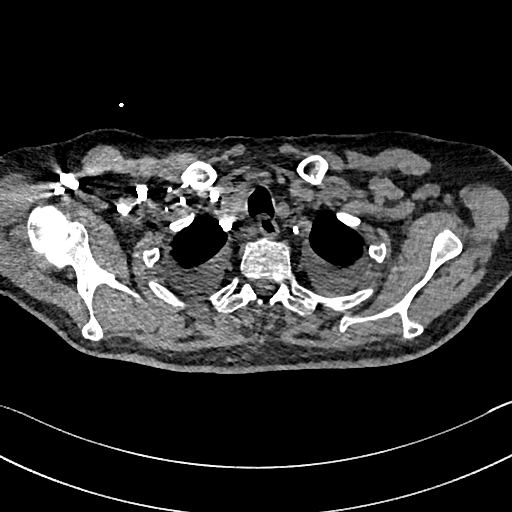
[im 444/494  lung]
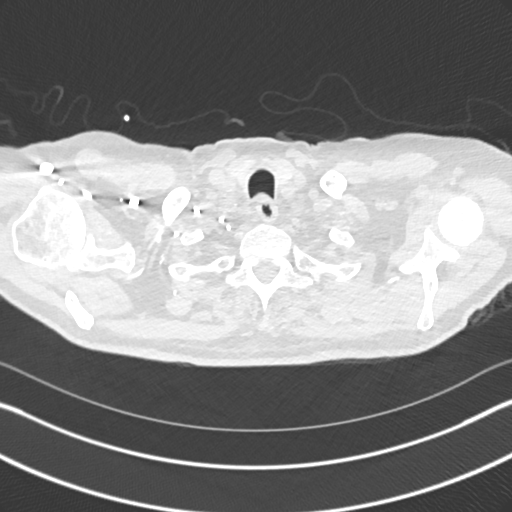
[im 469/494  mediastinal]
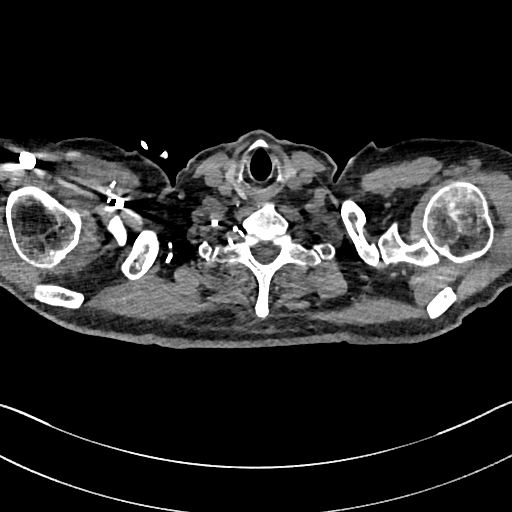

[Series 8: pe 2mm cor · coronal · 0.68mm/px · 1 of 124 slices shown]
[im 62/124  mediastinal]
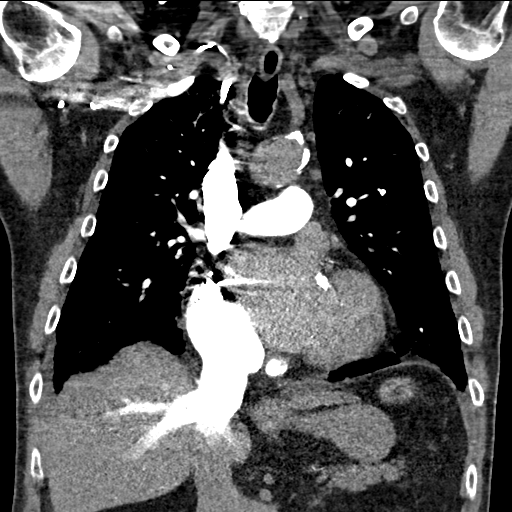

[19 of 36 positions shown; findings below may reference images not displayed]

FINDINGS: Cardiovascular: The quality of this exam for evaluation of pulmonary
embolism is good. The bolus is well timed. No evidence of pulmonary
embolism.

Aortic atherosclerosis. Aorta not well opacified secondary to bolus
timing.

Moderate cardiomegaly with right-sided chamber enlargement and
contrast reflux into the IVC and hepatic veins. Median sternotomy
for CABG.

Mediastinum/Nodes: Multiple small mediastinal and and borderline
enlarged right hilar node are likely secondary to fluid overload.

Lungs/Pleura: Small bilateral pleural effusions, significantly more
impressive than on plain film. Areas of smooth septal thickening
within both lungs are relatively mild. Scattered areas of
ground-glass in the perihilar lungs bilaterally.

Upper Abdomen: Small dependent gallstones. Normal imaged portions of
the spleen, stomach, pancreas, right adrenal gland, kidneys. Left
adrenal thickening.

Musculoskeletal: Prior median sternotomy.

Review of the MIP images confirms the above findings.
IMPRESSION: 1. No evidence of pulmonary embolism.
2. Findings of congestive heart failure, including cardiomegaly,
bilateral pleural effusions, and contrast reflux into the IVC and
hepatic veins. Latter findings suggest elevated right heart
pressures.
3. Cholelithiasis.
4. Aortic Atherosclerosis ([id number]).

## 2022-05-28 ENCOUNTER — Telehealth: Payer: Self-pay

## 2022-05-28 ENCOUNTER — Encounter (INDEPENDENT_AMBULATORY_CARE_PROVIDER_SITE_OTHER): Payer: Medicare Other | Admitting: Ophthalmology

## 2022-05-28 DIAGNOSIS — H353211 Exudative age-related macular degeneration, right eye, with active choroidal neovascularization: Secondary | ICD-10-CM | POA: Diagnosis not present

## 2022-05-28 MED ORDER — METOPROLOL SUCCINATE ER 25 MG PO TB24: 25.0000 mg | ORAL_TABLET | ORAL | Status: DC

## 2022-05-28 NOTE — Telephone Encounter (Signed)
Spoke with patient and discussed Dr. Thompson Caul recommendation regarding his heart monitor results.  Per Dr. Tamala Julian: Decrease Metoprolol to 12.5 mg daily (Could take 25 mg M, W, F)   Patient states he will take metoprolol 25mg  M/W/F.  Medication list updated.  Patient verbalized understanding and expressed appreciation for call.

## 2022-05-28 NOTE — Anesthesia Preprocedure Evaluation (Signed)
Anesthesia Evaluation  Patient identified by MRN, date of birth, ID band Patient awake    Reviewed: Allergy & Precautions, NPO status , Patient's Chart, lab work & pertinent test results, reviewed documented beta blocker date and time   History of Anesthesia Complications Negative for: history of anesthetic complications  Airway Mallampati: II  TM Distance: >3 FB Neck ROM: Full    Dental  (+) Dental Advisory Given, Caps   Pulmonary former smoker   breath sounds clear to auscultation       Cardiovascular hypertension, Pt. on medications and Pt. on home beta blockers (-) angina + CAD, + CABG, + Peripheral Vascular Disease and +CHF  + Valvular Problems/Murmurs MR  Rhythm:Irregular Rate:Normal + Systolic murmurs 94/85/4627 ECHO: severe residual MR s/p MitraClip, A1, P1 prolapse, LV has low normal function with EF 60-65%, mildly reduced RVF, severely dilated LA, severely dilated RA, mild descending aorta plaque   Neuro/Psych negative neurological ROS     GI/Hepatic negative GI ROS, Neg liver ROS,,,  Endo/Other  diabetes (glu 111), Oral Hypoglycemic Agents    Renal/GU Renal InsufficiencyRenal disease     Musculoskeletal   Abdominal   Peds  Hematology eliquis   Anesthesia Other Findings   Reproductive/Obstetrics                              Anesthesia Physical Anesthesia Plan  ASA: 4  Anesthesia Plan: General   Post-op Pain Management: Tylenol PO (pre-op)*   Induction: Intravenous  PONV Risk Score and Plan: 2 and Ondansetron and Dexamethasone  Airway Management Planned: Oral ETT  Additional Equipment: Arterial line  Intra-op Plan:   Post-operative Plan: Extubation in OR  Informed Consent: I have reviewed the patients History and Physical, chart, labs and discussed the procedure including the risks, benefits and alternatives for the proposed anesthesia with the patient or authorized  representative who has indicated his/her understanding and acceptance.     Dental advisory given  Plan Discussed with: CRNA and Surgeon  Anesthesia Plan Comments:          Anesthesia Quick Evaluation

## 2022-05-28 NOTE — Telephone Encounter (Signed)
-----   Message from Belva Crome, MD sent at 05/26/2022  8:14 PM EDT ----- Decrease Metoprolol to 12.5 mg daily (Could take 25 mg M, W, F) ----- Message ----- From: Molli Barrows Sent: 05/26/2022   6:14 PM EDT To: Belva Crome, MD  Patient states he had been taking 50mg  of metoprolol succinate (prescribed by Dr. Fara Olden). Started back on 25mg  metoprolol about 1.5-2 weeks ago. Please advise.  The patient has been notified of the result and verbalized understanding.  All questions (if any) were answered. Molli Barrows, RN 05/26/2022 6:01 PM

## 2022-05-28 NOTE — Telephone Encounter (Signed)
-----   Message from Henry W Smith, MD sent at 05/26/2022  8:14 PM EDT ----- Decrease Metoprolol to 12.5 mg daily (Could take 25 mg M, W, F) ----- Message ----- From: Ricky Doan L Sent: 05/26/2022   6:14 PM EDT To: Henry W Smith, MD  Patient states he had been taking 50mg of metoprolol succinate (prescribed by Dr. Varadarajan). Started back on 25mg metoprolol about 1.5-2 weeks ago. Please advise.  The patient has been notified of the result and verbalized understanding.  All questions (if any) were answered. Tedrick [place] L Nels Munn, RN 05/26/2022 6:01 PM     

## 2022-05-29 ENCOUNTER — Encounter (HOSPITAL_COMMUNITY): Payer: Self-pay | Admitting: Cardiovascular Disease

## 2022-05-29 ENCOUNTER — Inpatient Hospital Stay (HOSPITAL_COMMUNITY): Payer: Medicare Other | Admitting: Physician Assistant

## 2022-05-29 ENCOUNTER — Inpatient Hospital Stay (HOSPITAL_COMMUNITY): Payer: Medicare Other | Admitting: Certified Registered"

## 2022-05-29 ENCOUNTER — Inpatient Hospital Stay (HOSPITAL_COMMUNITY): Payer: Medicare Other

## 2022-05-29 ENCOUNTER — Inpatient Hospital Stay (HOSPITAL_COMMUNITY)
Admission: RE | Admit: 2022-05-29 | Discharge: 2022-05-30 | DRG: 266 | Disposition: A | Discharge status: Home / Self Care | Payer: Medicare Other | Attending: Cardiovascular Disease | Admitting: Cardiovascular Disease

## 2022-05-29 ENCOUNTER — Encounter (HOSPITAL_COMMUNITY)
Admission: RE | Disposition: A | Discharge status: Home / Self Care | Payer: Self-pay | Source: Home / Self Care | Attending: Cardiovascular Disease

## 2022-05-29 ENCOUNTER — Other Ambulatory Visit: Payer: Self-pay

## 2022-05-29 DIAGNOSIS — I10 Essential (primary) hypertension: Secondary | ICD-10-CM | POA: Diagnosis present

## 2022-05-29 DIAGNOSIS — Z8249 Family history of ischemic heart disease and other diseases of the circulatory system: Secondary | ICD-10-CM

## 2022-05-29 DIAGNOSIS — Z7901 Long term (current) use of anticoagulants: Secondary | ICD-10-CM

## 2022-05-29 DIAGNOSIS — Z87891 Personal history of nicotine dependence: Secondary | ICD-10-CM

## 2022-05-29 DIAGNOSIS — E785 Hyperlipidemia, unspecified: Secondary | ICD-10-CM | POA: Diagnosis present

## 2022-05-29 DIAGNOSIS — I5033 Acute on chronic diastolic (congestive) heart failure: Secondary | ICD-10-CM | POA: Diagnosis present

## 2022-05-29 DIAGNOSIS — E1151 Type 2 diabetes mellitus with diabetic peripheral angiopathy without gangrene: Secondary | ICD-10-CM | POA: Diagnosis present

## 2022-05-29 DIAGNOSIS — I272 Pulmonary hypertension, unspecified: Secondary | ICD-10-CM | POA: Diagnosis present

## 2022-05-29 DIAGNOSIS — Z952 Presence of prosthetic heart valve: Secondary | ICD-10-CM

## 2022-05-29 DIAGNOSIS — Z20822 Contact with and (suspected) exposure to covid-19: Secondary | ICD-10-CM | POA: Diagnosis present

## 2022-05-29 DIAGNOSIS — I110 Hypertensive heart disease with heart failure: Secondary | ICD-10-CM | POA: Diagnosis present

## 2022-05-29 DIAGNOSIS — I4891 Unspecified atrial fibrillation: Secondary | ICD-10-CM | POA: Diagnosis present

## 2022-05-29 DIAGNOSIS — E119 Type 2 diabetes mellitus without complications: Secondary | ICD-10-CM

## 2022-05-29 DIAGNOSIS — I2510 Atherosclerotic heart disease of native coronary artery without angina pectoris: Secondary | ICD-10-CM | POA: Diagnosis present

## 2022-05-29 DIAGNOSIS — Z006 Encounter for examination for normal comparison and control in clinical research program: Secondary | ICD-10-CM | POA: Diagnosis not present

## 2022-05-29 DIAGNOSIS — Z79899 Other long term (current) drug therapy: Secondary | ICD-10-CM

## 2022-05-29 DIAGNOSIS — Z9889 Other specified postprocedural states: Secondary | ICD-10-CM

## 2022-05-29 DIAGNOSIS — Z954 Presence of other heart-valve replacement: Secondary | ICD-10-CM | POA: Diagnosis not present

## 2022-05-29 DIAGNOSIS — E7800 Pure hypercholesterolemia, unspecified: Secondary | ICD-10-CM | POA: Diagnosis present

## 2022-05-29 DIAGNOSIS — Z7984 Long term (current) use of oral hypoglycemic drugs: Secondary | ICD-10-CM

## 2022-05-29 DIAGNOSIS — I509 Heart failure, unspecified: Secondary | ICD-10-CM

## 2022-05-29 DIAGNOSIS — I340 Nonrheumatic mitral (valve) insufficiency: Secondary | ICD-10-CM | POA: Diagnosis present

## 2022-05-29 DIAGNOSIS — I4819 Other persistent atrial fibrillation: Secondary | ICD-10-CM | POA: Diagnosis present

## 2022-05-29 DIAGNOSIS — I25810 Atherosclerosis of coronary artery bypass graft(s) without angina pectoris: Secondary | ICD-10-CM | POA: Diagnosis present

## 2022-05-29 DIAGNOSIS — Z95818 Presence of other cardiac implants and grafts: Secondary | ICD-10-CM

## 2022-05-29 HISTORY — PX: TEE WITHOUT CARDIOVERSION: SHX5443

## 2022-05-29 HISTORY — PX: TRANSCATHETER MITRAL EDGE TO EDGE REPAIR: CATH118311

## 2022-05-29 HISTORY — PX: MITRAL VALVE REPAIR: CATH118311

## 2022-05-29 LAB — GLUCOSE, CAPILLARY
Glucose-Capillary: 111 mg/dL — ABNORMAL HIGH (ref 70–99)
Glucose-Capillary: 125 mg/dL — ABNORMAL HIGH (ref 70–99)
Glucose-Capillary: 276 mg/dL — ABNORMAL HIGH (ref 70–99)
Glucose-Capillary: 167 mg/dL — ABNORMAL HIGH (ref 70–99)

## 2022-05-29 LAB — ECHO TEE
MV M vel: 6.03 m/s
MV Peak grad: 145.4 mmHg
MV VTI: 1.92 cm2
Radius: 0.7 cm

## 2022-05-29 SURGERY — MITRAL VALVE REPAIR
Anesthesia: General

## 2022-05-29 MED ORDER — ACETAMINOPHEN 500 MG PO TABS
1000.0000 mg | ORAL_TABLET | Freq: Once | ORAL | Status: AC
  Administered 2022-05-29: 1000 mg via ORAL
  Filled 2022-05-29: qty 2

## 2022-05-29 MED ORDER — LABETALOL HCL 5 MG/ML IV SOLN: 10.0000 mg | INTRAVENOUS | Status: DC | PRN

## 2022-05-29 MED ORDER — HEPARIN (PORCINE) IN NACL 2000-0.9 UNIT/L-% IV SOLN
INTRAVENOUS | Status: AC
  Filled 2022-05-29: qty 1000

## 2022-05-29 MED ORDER — INSULIN ASPART 100 UNIT/ML IJ SOLN
0.0000 [IU] | Freq: Three times a day (TID) | INTRAMUSCULAR | Status: DC
  Administered 2022-05-29: 8 [IU] via SUBCUTANEOUS

## 2022-05-29 MED ORDER — PROPOFOL 10 MG/ML IV BOLUS
INTRAVENOUS | Status: DC | PRN
  Administered 2022-05-29: 70 mg via INTRAVENOUS

## 2022-05-29 MED ORDER — FUROSEMIDE 20 MG PO TABS
20.0000 mg | ORAL_TABLET | Freq: Every day | ORAL | Status: DC
  Administered 2022-05-29 – 2022-05-30 (×2): 20 mg via ORAL
  Filled 2022-05-29 (×2): qty 1

## 2022-05-29 MED ORDER — DEXAMETHASONE SODIUM PHOSPHATE 10 MG/ML IJ SOLN
INTRAMUSCULAR | Status: DC | PRN
  Administered 2022-05-29: 4 mg via INTRAVENOUS

## 2022-05-29 MED ORDER — HEPARIN (PORCINE) IN NACL 2000-0.9 UNIT/L-% IV SOLN
INTRAVENOUS | Status: DC | PRN
  Administered 2022-05-29 (×3): 1000 mL

## 2022-05-29 MED ORDER — SODIUM CHLORIDE 0.9% FLUSH: 3.0000 mL | INTRAVENOUS | Status: DC | PRN

## 2022-05-29 MED ORDER — HYDRALAZINE HCL 20 MG/ML IJ SOLN
INTRAMUSCULAR | Status: DC | PRN
  Administered 2022-05-29 (×2): 5 mg via INTRAVENOUS

## 2022-05-29 MED ORDER — ONDANSETRON HCL 4 MG/2ML IJ SOLN
INTRAMUSCULAR | Status: DC | PRN
  Administered 2022-05-29: 4 mg via INTRAVENOUS

## 2022-05-29 MED ORDER — LOSARTAN POTASSIUM-HCTZ 50-12.5 MG PO TABS: 1.0000 | ORAL_TABLET | Freq: Every day | ORAL | Status: DC

## 2022-05-29 MED ORDER — ACETAMINOPHEN 325 MG PO TABS: 650.0000 mg | ORAL_TABLET | ORAL | Status: DC | PRN

## 2022-05-29 MED ORDER — SODIUM CHLORIDE 0.9% FLUSH
3.0000 mL | Freq: Two times a day (BID) | INTRAVENOUS | Status: DC
  Administered 2022-05-29: 3 mL via INTRAVENOUS

## 2022-05-29 MED ORDER — NOREPINEPHRINE 4 MG/250ML-% IV SOLN
INTRAVENOUS | Status: AC
  Filled 2022-05-29: qty 250

## 2022-05-29 MED ORDER — SODIUM CHLORIDE 0.9 % IV SOLN: INTRAVENOUS | Status: DC

## 2022-05-29 MED ORDER — CEFAZOLIN SODIUM-DEXTROSE 2-4 GM/100ML-% IV SOLN
2.0000 g | INTRAVENOUS | Status: AC
  Administered 2022-05-29: 2 g via INTRAVENOUS
  Filled 2022-05-29: qty 100

## 2022-05-29 MED ORDER — CHLORHEXIDINE GLUCONATE 0.12 % MT SOLN
15.0000 mL | Freq: Once | OROMUCOSAL | Status: AC
  Administered 2022-05-29: 15 mL via OROMUCOSAL
  Filled 2022-05-29: qty 15

## 2022-05-29 MED ORDER — CHLORHEXIDINE GLUCONATE 4 % EX LIQD: 30.0000 mL | CUTANEOUS | Status: DC

## 2022-05-29 MED ORDER — HYDRALAZINE HCL 20 MG/ML IJ SOLN: 5.0000 mg | INTRAMUSCULAR | Status: DC | PRN

## 2022-05-29 MED ORDER — ROCURONIUM BROMIDE 10 MG/ML (PF) SYRINGE
PREFILLED_SYRINGE | INTRAVENOUS | Status: DC | PRN
  Administered 2022-05-29: 50 mg via INTRAVENOUS
  Administered 2022-05-29: 10 mg via INTRAVENOUS

## 2022-05-29 MED ORDER — POTASSIUM CHLORIDE CRYS ER 20 MEQ PO TBCR
20.0000 meq | EXTENDED_RELEASE_TABLET | Freq: Every day | ORAL | Status: DC
  Administered 2022-05-29 – 2022-05-30 (×2): 20 meq via ORAL
  Filled 2022-05-29 (×2): qty 1

## 2022-05-29 MED ORDER — ONDANSETRON HCL 4 MG/2ML IJ SOLN: 4.0000 mg | Freq: Four times a day (QID) | INTRAMUSCULAR | Status: DC | PRN

## 2022-05-29 MED ORDER — LACTATED RINGERS IV SOLN: INTRAVENOUS | Status: DC | PRN

## 2022-05-29 MED ORDER — LIDOCAINE 2% (20 MG/ML) 5 ML SYRINGE
INTRAMUSCULAR | Status: DC | PRN
  Administered 2022-05-29: 25 mg via INTRAVENOUS

## 2022-05-29 MED ORDER — EPHEDRINE SULFATE-NACL 50-0.9 MG/10ML-% IV SOSY
PREFILLED_SYRINGE | INTRAVENOUS | Status: DC | PRN
  Administered 2022-05-29: 2.5 mg via INTRAVENOUS
  Administered 2022-05-29: 5 mg via INTRAVENOUS
  Administered 2022-05-29 (×2): 2.5 mg via INTRAVENOUS

## 2022-05-29 MED ORDER — CHLORHEXIDINE GLUCONATE 4 % EX LIQD: 60.0000 mL | Freq: Once | CUTANEOUS | Status: DC

## 2022-05-29 MED ORDER — HEPARIN SODIUM (PORCINE) 1000 UNIT/ML IJ SOLN
INTRAMUSCULAR | Status: DC | PRN
  Administered 2022-05-29: 10000 [IU] via INTRAVENOUS

## 2022-05-29 MED ORDER — PRAVASTATIN SODIUM 40 MG PO TABS
20.0000 mg | ORAL_TABLET | Freq: Every day | ORAL | Status: DC
  Administered 2022-05-29: 20 mg via ORAL
  Filled 2022-05-29: qty 1

## 2022-05-29 MED ORDER — HEPARIN (PORCINE) IN NACL 1000-0.9 UT/500ML-% IV SOLN
INTRAVENOUS | Status: DC | PRN
  Administered 2022-05-29: 500 mL

## 2022-05-29 MED ORDER — FENTANYL CITRATE (PF) 100 MCG/2ML IJ SOLN
INTRAMUSCULAR | Status: DC | PRN
  Administered 2022-05-29: 50 ug via INTRAVENOUS

## 2022-05-29 MED ORDER — SODIUM CHLORIDE 0.9 % IV SOLN: 250.0000 mL | INTRAVENOUS | Status: DC | PRN

## 2022-05-29 MED ORDER — PHENYLEPHRINE HCL-NACL 20-0.9 MG/250ML-% IV SOLN
INTRAVENOUS | Status: DC | PRN
  Administered 2022-05-29: 10 ug/min via INTRAVENOUS

## 2022-05-29 MED ORDER — HEPARIN (PORCINE) IN NACL 1000-0.9 UT/500ML-% IV SOLN
INTRAVENOUS | Status: AC
  Filled 2022-05-29: qty 500

## 2022-05-29 MED ORDER — FENTANYL CITRATE (PF) 100 MCG/2ML IJ SOLN
INTRAMUSCULAR | Status: AC
  Filled 2022-05-29: qty 2

## 2022-05-29 MED ORDER — HYDROCHLOROTHIAZIDE 12.5 MG PO TABS
12.5000 mg | ORAL_TABLET | Freq: Every day | ORAL | Status: DC
  Administered 2022-05-29 – 2022-05-30 (×2): 12.5 mg via ORAL
  Filled 2022-05-29 (×2): qty 1

## 2022-05-29 MED ORDER — ISOSORBIDE MONONITRATE ER 30 MG PO TB24
30.0000 mg | ORAL_TABLET | Freq: Every day | ORAL | Status: DC
  Administered 2022-05-29 – 2022-05-30 (×2): 30 mg via ORAL
  Filled 2022-05-29 (×2): qty 1

## 2022-05-29 MED ORDER — APIXABAN 5 MG PO TABS
5.0000 mg | ORAL_TABLET | Freq: Two times a day (BID) | ORAL | Status: DC
  Administered 2022-05-29 – 2022-05-30 (×3): 5 mg via ORAL
  Filled 2022-05-29 (×3): qty 1

## 2022-05-29 MED ORDER — LOSARTAN POTASSIUM 50 MG PO TABS
50.0000 mg | ORAL_TABLET | Freq: Every day | ORAL | Status: DC
  Administered 2022-05-29 – 2022-05-30 (×2): 50 mg via ORAL
  Filled 2022-05-29 (×2): qty 1

## 2022-05-29 MED ORDER — METOPROLOL SUCCINATE ER 25 MG PO TB24
25.0000 mg | ORAL_TABLET | ORAL | Status: DC
  Filled 2022-05-29: qty 1

## 2022-05-29 SURGICAL SUPPLY — 18 items
CATH MITRA STEERABLE GUIDE (CATHETERS) IMPLANT
CLIP MITRA G4 DELIVERY SYS NT (Clip) IMPLANT
CLOSURE PERCLOSE PROSTYLE (VASCULAR PRODUCTS) IMPLANT
ELECT DEFIB PAD ADLT CADENCE (PAD) IMPLANT
GUIDEWIRE SAFE TJ AMPLATZ EXST (WIRE) IMPLANT
KIT HEART LEFT (KITS) ×2 IMPLANT
KIT MICROPUNCTURE NIT STIFF (SHEATH) IMPLANT
KIT VERSACROSS LRG ACCESS (CATHETERS) IMPLANT
PACK CARDIAC CATHETERIZATION (CUSTOM PROCEDURE TRAY) ×1 IMPLANT
SHEATH DILAT COONS TAPER 22F (SHEATH) IMPLANT
SHEATH PINNACLE 8F 10CM (SHEATH) IMPLANT
SHEATH PROBE COVER 6X72 (BAG) ×1 IMPLANT
SHIELD RADPAD SCOOP 12X17 (MISCELLANEOUS) IMPLANT
STOPCOCK MORSE 400PSI 3WAY (MISCELLANEOUS) ×6 IMPLANT
SYSTEM MITRACLIP G4 (SYSTAGENIX WOUND MANAGEMENT) IMPLANT
TRANSDUCER W/STOPCOCK (MISCELLANEOUS) ×1 IMPLANT
TUBING ART PRESS 72  MALE/FEM (TUBING) ×2
TUBING ART PRESS 72 MALE/FEM (TUBING) ×1 IMPLANT

## 2022-05-29 NOTE — Discharge Summary (Addendum)
[facility]   MULTIDISCIPLINARY HEART VALVE TEAM  Discharge Summary    Patient ID: Micheal [name] MRN: [id number]; DOB: 12-05-1940  Admit date: 05/29/2022 Discharge date: 05/30/2022  Primary Care Provider: Lorenda Ishihara, MD  Primary Cardiologist: Lesleigh Noe, MD / Dr. Excell Seltzer, MD & Dr. Lynnette Caffey, MD (TEER)  Discharge Diagnoses    Principal Problem:   Mitral valve regurgitation Active Problems:   CAD (coronary artery disease)   HTN (hypertension)   Hyperlipidemia   Diabetes (HCC)   Coronary artery disease involving coronary bypass graft of native heart without angina pectoris   Atrial fibrillation (HCC)   Nonrheumatic mitral valve insufficiency   S/P mitral valve clip implantation  Allergies No Known Allergies  Diagnostic Studies/Procedures    TEER 05/29/22:  [facility]   MULTIDISCIPLINARY HEART TEAM  Date of Procedure:  05/29/2022  Preoperative Diagnosis: Severe Symptomatic Mitral Regurgitation (Stage D)  Postoperative Diagnosis: Same   Procedure Performed: Ultrasound-guided right transfemoral venous access Double PreClose right femoral vein Transseptal puncture using Bailess RF needle Mitral valve repair with MitraClip NT  Surgeon: Micheline Chapman, MD Co-Surgeon: Alverda Skeans, MD   Echocardiographer: Guinevere Scarlet, MD  Anesthesiologist: Jairo Ben, MD  Device Implant: Mitraclip NT  Procedural Indication: Severe Non-rheumatic Mitral Regurgitation (Stage D)   Brief History: This is an 81 year old male with severe, symptomatic, primary mitral regurgitation.  The patient underwent TEER with MitraClip last year with an XT W device positioned A2/P2.  He initially did well but developed recurrent severe mitral regurgitation lateral to the clip device.  He progressed with NYHA functional class III symptoms of exertional dyspnea and fatigue.  The patient underwent repeat transesophageal echo and after multidisciplinary  team review of his case, we felt the patient's mitral regurgitation could be approached with a repeat MitraClip procedure.  Echo Findings: Preop:  Normal LV systolic function Severe MR secondary to severe prolapse lateral to the initial MitraClip device, Grade 4+ Post-op:  Unchanged LV systolic function Trace residual MR  Procedural Details: Prep The patient is brought to the cardiac catheterization lab in the fasting state. General anesthesia is induced. The patient is prepped from the groin to chin.   Venous Access Using ultrasound guidance, the right femoral vein is punctured. Ultrasound images are captured and stored in the patient's chart. The vein is dilated and 2 Perclose devices are deplyed at 10' and 2' positions to 'Preclose' the femoral vein. An 8 Fr sheath is inserted.  Transseptal Puncture A Baylis Versacross wire is advanced into the SVC A Baylis transseptal dilator is advanced into the SVC, and the VersaCross RF wire is retracted into the dilator  The transseptal sheath is retracted into the RA under fluoroscopic and echo guidance to obtain position on the posterior fossa where echo measurements are made to assure appropriate access to the mitral valve. Once proper position is confirmed by echo, RF energy is delivered and the VersaCross wire is advanced into the LA without resistance. The dilator is advanced over the wire where proper position is confirmed by echo and pressure measurement Weight based IV heparin is administered and a therapeutic ACT > 250 is confirmed prior to crossing the septum  Steerable Guide Catheter Insertion The VersaCross wire is positioned at the left upper pulmonary vein The femoral vein is progressively dilated and the 24 Fr Steerable guide catheter is inserted and then directed across the interatrial septum over the wire. Position is confirmed approximately 3 cm into the left atrium  The guide is de-aired   MitraClip Insertion The MitraClip NT  is prepped per protocol and inserted via the introducer into the steerable guide catheter The Clip Delivery System (CDS) is advanced under fluoro and echo guidance so that the sleeve markers are evenly spaced on each side of the guide marker  MitraClip Positioning in the Left Atrium (Supravalvular Alignment) M-knob is applied to bring the Clip towards the mitral valve.  Caution is taken to avoid interaction with the first clip device.  Echo guidance is used to avoid contact with LA structures. The Clip arms are opened to 180 degrees 2D and 3D TEE imaging is performed in multiple planes and the Clip is positioned and aligned above the valve using standard steering techniques   Entry into the Left Ventricle and Mitral Valve Leaflet Grasp The Clip is advanced across the mitral valve into the LV adjacent to and lateral to the first clip device, maintaining proper orientation The Clip arms are opened to 120 degrees and the Clip is slowly retracted  Capture of both the anterior and posterior leaflets are visualized by echo and the grippers are dropped  MitraClip Deployment After extensive echo evaluation, reduction in mitral regurgitation is felt to be adequate.  The mean transmitral gradient is 3 mmHg after clip deployment.  Following standard protocol, the lock line is removed after testing the lock mechanism. The lock is rechecked and is shown to be intact. The MitraClip device is deployed and the clip delivery system is removed under echo guidance with caution taken to avoid contact with LA structures  Device Removal The clip delivery system is removed under echo guidance The steerable guide catheter is retracted into the right atrium and the interatrial septum is assessed by echo without evidence of right-to-left shunting or large ASD  Hemostasis The guide catheter is removed over a 0.035" wire and the Perclose sutures are tightened with complete hemostasis and no evidence of  hematoma  Estimated blood loss: minimal  There are no immediate procedural complications. The patient is transferred to the post-procedure recovery area in stable condition.   ___________   Echo 05/30/22: Completed but pending formal read at the time of discharge   History of Present Illness     Micheal [name] is a 81 y.o. male with a history of CAD s/p CABG 2007, DM2, persistent atrial fibrillation on Eliquis, chronic CHF, PAD, and severe mitral regurgitation s/p TEER 04/2021 who developed worsening SOB and fatigue over the last several months found to have severe residual MR due to persistent bileaflet prolapse. He presented to [facility] for TEER 05/29/22.    Mr. Mcphearson was initially referred to Dr. Excell Seltzer 03/05/21 for consideration of treatment options regarding severe mitral regurgitation. He had recently been diagnosed with atrial fibrillation 02/24/21 when he presented with dyspnea, fatigue, and clinical signs of congestive heart failure. He underwent TEE guided cardioversion 02/26/21 and was found to have moderate/severe mitral regurgitation with bileaflet prolapse. He subsequently underwent right and left cardiac catheterization 04/03/2021 which demonstrated occlusion of the SBG>OM with severe disease in the native OM, however this was noted to be a very small vessel with recommendations for continued medical treatment. The SVG to PDA and PLA and the LIMA to LAD graft were both patent. There was 32 mmHg V waves in the wedge position and with moderate pulmonary hypertension, consistent with hemodynamically important mitral regurgitation.   He was seen by Dr. Excell Seltzer 04/08/21 with essentially unchanged symptoms of exertional dyspnea and fatigue. He was  felt to be a good candidate for transcatheter edge-to-edge mitral repair although he was referred for formal surgical consultation as part of a multidisciplinary team approach to his care. He saw Dr. Cliffton AstersLightfoot 04/26/21 and was felt to be high risk for re-do  sternotomy therefore felt better served with MitraClip placement.    He ultimately underwent successful transcatheter mitral edge to edge repair with one XTW clip positioned on A2/P2 reducing severe degenerative nonrheumatic mitral regurgitation to mild to moderate mitral regurgitation. Post clip recommendations were to restart home anticoagulation at prior dose.   He did well for some time after clip placement however was seen by Dr. [name] for regular follow up with exertional fatigue and SOB on 05/05/22. Repeat echocardiogram at that time showed normal LV function with worsening of his mitral regurgitation.  A TEE was recommended because of his progressive symptoms and change on surface echo.  This confirmed severe eccentric mitral regurgitation with a stable MitraClip device in the A2/P2 position, but the residual MR arises lateral to the clip and an area of prolapse. He was seen back with Dr. Excell Seltzerooper 05/23/22 with plans to proceed with additional Mitraclip placement scheduled for 05/28/22.   Hospital Course    Severe non-rheumatic mitral valve insufficiency: Initially underwent successful transcatheter mitral edge to edge repair with one XTW clip positioned on A2/P2 reducing severe degenerative nonrheumatic mitral regurgitation to mild to moderate mitral regurgitation and has since developed eccentric mitral regurgitation with a stable MitraClip device in the A2/P2 position on repeat TEE. See by Dr. Excell Seltzerooper with plans for repeat MitraClip. He is now successful MitraClip placement 05/29/22 using single MitraClip NT device positioned A1/P1, reducing mitral regurgitation from 4+ at baseline to trace in the post procedure setting. Echocardiogram performed today however final results are pending. Post procedure instructions reviewed with understanding. He was restated on Eliquis 5mg  BID without complication. Plan close follow up with myself.   Persistent atrial fibrillation: Restarted on Eliquis afternoon of  procedure. Rates controlled. Continue current regimen.   Chronic CHF: Appears euvolemic on exam. No changes today.   DM2: SSI while inpatient. Resume home medications after discharge.    Consultants: None    The patient was seen and examined by Dr. Excell Seltzerooper who feels that he is stable and ready for discharge today, 05/30/22.  _____________  Discharge Vitals Blood pressure 109/81, pulse (!) 49, temperature 98.3 F (36.8 C), temperature source Oral, resp. rate 18, height 5\' 8"  (1.727 m), weight 68.4 kg, SpO2 96 %.  Filed Weights   05/29/22 0543  Weight: 68.4 kg   General: Well developed, well nourished, NAD Lungs:Clear to ausculation bilaterally. No wheezes, rales, or rhonchi. Breathing is unlabored. Cardiovascular: Irregularly irregular. No murmurs Abdomen: Soft, non-tender, non-distended. No obvious abdominal masses. Extremities: No edema.  Neuro: Alert and oriented. No focal deficits. No facial asymmetry. MAE spontaneously. Psych: Responds to questions appropriately with normal affect.    Labs & Radiologic Studies    CBC Recent Labs    05/30/22 0019  WBC 8.2  HGB 12.5*  HCT 37.0*  MCV 103.4*  PLT 160   Basic Metabolic Panel Recent Labs    40/98/1109/10/17 0019  NA 139  K 4.6  CL 105  CO2 28  GLUCOSE 118*  BUN 22  CREATININE 1.27*  CALCIUM 9.0   Liver Function Tests No results for input(s): "AST", "ALT", "ALKPHOS", "BILITOT", "PROT", "ALBUMIN" in the last 72 hours. No results for input(s): "LIPASE", "AMYLASE" in the last 72 hours. Cardiac Enzymes No results  for input(s): "CKTOTAL", "CKMB", "CKMBINDEX", "TROPONINI" in the last 72 hours. BNP Invalid input(s): "POCBNP" D-Dimer No results for input(s): "DDIMER" in the last 72 hours. Hemoglobin A1C No results for input(s): "HGBA1C" in the last 72 hours. Fasting Lipid Panel No results for input(s): "CHOL", "HDL", "LDLCALC", "TRIG", "CHOLHDL", "LDLDIRECT" in the last 72 hours. Thyroid Function Tests No results for  input(s): "TSH", "T4TOTAL", "T3FREE", "THYROIDAB" in the last 72 hours.  Invalid input(s): "FREET3" _____________  CARDIAC CATHETERIZATION  Result Date: 05/29/2022 Successful transcatheter edge-to-edge repair of the mitral valve using a single MitraClip NT device positioned A1/P1, reducing mitral regurgitation from 4+ at baseline to trace post procedure.   ECHO TEE  Result Date: 05/29/2022    TRANSESOPHOGEAL ECHO REPORT   Patient Name:   Micheal [name] Date of Exam: 05/29/2022 Medical Rec #:  [id number]       Height:       68.0 in Accession #:    [id number]      Weight:       150.8 lb Date of Birth:  Apr 06, 1941       BSA:          1.813 m Patient Age:    81 years        BP:           147/67 mmHg Patient Gender: M               HR:           61 bpm. Exam Location:  Inpatient Procedure: Transesophageal Echo, 3D Echo, Color Doppler and Cardiac Doppler Indications:     Mitral Regurgitation i34.0  History:         Patient has prior history of Echocardiogram examinations, most                  recent 05/22/2022. CAD, Arrythmias:Atrial Fibrillation; Risk                  Factors:Hypertension, Diabetes and Dyslipidemia. Prior                  MitraClip XTW placed 05/09/21.                   Mitral Valve: Mitra-Clip valve is present in the mitral                  position. Procedure Date: 05/29/2022.  Sonographer:     Irving Burton Senior RDCS Referring Phys:  [phone] Lya Holben Diagnosing Phys: Lennie Odor MD PROCEDURE: After discussion of the risks and benefits of a TEE, an informed consent was obtained from the patient. The transesophogeal probe was passed without difficulty through the esophogus of the patient. Sedation performed by different physician. The patient was monitored while under deep sedation. Image quality was excellent. The patient's vital signs; including heart rate, blood pressure, and oxygen saturation; remained stable throughout the procedure. The patient developed no complications during the  procedure. TEE guided Mitraclip procedure.  IMPRESSIONS  1. TEE guided Mitraclip procedure. A previously placed XTW device was present in the A2-P2 position. Severe MR was present laterally to this device due to severe bileaflet prolapse. A NT mitraclip was placed in the A1-P1 position under TEE. Trivial MR was present after the case. The lateral orifice from the prior clip procedure was closed. The medial orifice had a MVA of 2.30 cm2 by 3D planimetry. MG 3 mmHG. No significant stenosis. No immediate complications were observed. There was systolic reversal  of flow in the LUPV before the procedure and systolic dominant flow after the new clip was placed. A small iatrogenic ASD was present from IAS puncture with L to R flow after the case. The mitral valve is myxomatous. Trivial mitral valve regurgitation. No evidence of mitral stenosis. There is a Mitra-Clip present in the mitral position. Procedure Date: 05/29/2022.  2. Left ventricular ejection fraction, by estimation, is 60 to 65%. The left ventricle has normal function.  3. Right ventricular systolic function is normal. The right ventricular size is normal.  4. Left atrial size was severely dilated. No left atrial/left atrial appendage thrombus was detected.  5. Right atrial size was mild to moderately dilated.  6. The aortic valve is tricuspid. Aortic valve regurgitation is not visualized. No aortic stenosis is present.  7. There is mild (Grade II) protruding plaque involving the aortic arch and descending aorta.  8. Evidence of atrial level shunting detected by color flow Doppler.  9. TEE guided Mitraclip procedure. FINDINGS  Left Ventricle: Left ventricular ejection fraction, by estimation, is 60 to 65%. The left ventricle has normal function. The left ventricular internal cavity size was normal in size. Right Ventricle: The right ventricular size is normal. No increase in right ventricular wall thickness. Right ventricular systolic function is normal. Left  Atrium: Left atrial size was severely dilated. No left atrial/left atrial appendage thrombus was detected. Right Atrium: Right atrial size was mild to moderately dilated. Pericardium: Trivial pericardial effusion is present. Mitral Valve: TEE guided Mitraclip procedure. A previously placed XTW device was present in the A2-P2 position. Severe MR was present laterally to this device due to severe bileaflet prolapse. A NT mitraclip was placed in the A1-P1 position under TEE. Trivial MR was present after the case. The lateral orifice from the prior clip procedure was closed. The medial orifice had a MVA of 2.30 cm2 by 3D planimetry. MG 3 mmHG. No significant stenosis. No immediate complications were observed. There was systolic reversal of flow in the LUPV before the procedure and systolic dominant flow after the new clip was placed. A small iatrogenic ASD was present from IAS puncture with L to R flow after the case. The mitral valve is myxomatous. Trivial mitral valve regurgitation. There is a Mitra-Clip present in the mitral position. Procedure Date: 05/29/2022. No evidence of mitral valve stenosis. MV peak gradient, 6.0 mmHg. The mean mitral valve gradient is 3.0 mmHg with average heart rate of 67 bpm. Tricuspid Valve: The tricuspid valve is grossly normal. Tricuspid valve regurgitation is mild . No evidence of tricuspid stenosis. Aortic Valve: The aortic valve is tricuspid. Aortic valve regurgitation is not visualized. No aortic stenosis is present. Pulmonic Valve: The pulmonic valve was grossly normal. Pulmonic valve regurgitation is mild. No evidence of pulmonic stenosis. Aorta: The aortic root and ascending aorta are structurally normal, with no evidence of dilitation. There is mild (Grade II) protruding plaque involving the aortic arch and descending aorta. IAS/Shunts: Evidence of atrial level shunting detected by color flow Doppler.  LEFT VENTRICLE PLAX 2D LVOT diam:     2.10 cm LV SV:         49 LV SV Index:    27 LVOT Area:     3.46 cm  AORTIC VALVE LVOT Vmax:   69.37 cm/s LVOT Vmean:  43.500 cm/s LVOT VTI:    0.141 m  AORTA Ao Root diam: 3.70 cm Ao Asc diam:  3.10 cm MITRAL VALVE  TRICUSPID VALVE MV Area VTI:  1.92 cm        TR Peak grad:   20.4 mmHg MV Peak grad: 6.0 mmHg        TR Vmax:        226.00 cm/s MV Mean grad: 3.0 mmHg MV Vmax:      1.22 m/s        SHUNTS MV Vmean:     69.1 cm/s       Systemic VTI:  0.14 m MR Peak grad:    145.4 mmHg   Systemic Diam: 2.10 cm MR Mean grad:    84.0 mmHg MR Vmax:         603.00 cm/s MR Vmean:        422.0 cm/s MR PISA:         3.08 cm MR PISA Eff ROA: 20 mm MR PISA Radius:  0.70 cm Lennie Odor MD Electronically signed by Lennie Odor MD Signature Date/Time: 05/29/2022/12:52:02 PM    Final    DG Chest 2 View  Result Date: 05/27/2022 CLINICAL DATA:  Pre-admission cardiac procedure. EXAM: CHEST - 2 VIEW COMPARISON:  11/07/2021 FINDINGS: Stable changes from prior CABG surgery. Cardiac silhouette is normal in size. No mediastinal or hilar masses. No evidence of adenopathy. Clear lungs.  No pleural effusion or pneumothorax. Skeletal structures are intact. IMPRESSION: No active cardiopulmonary disease. Electronically Signed   By: Amie Portland M.D.   On: 05/27/2022 15:18   LONG TERM MONITOR (3-14 DAYS)  Result Date: 05/25/2022   Continuous atrial fibrillation with HR range 37-93 bpm, average HR 60 bpm.   Qualifies as chronotropic incompetence Patch Wear Time:  3 days and 6 hours (2023-10-13T07:57:11-398 to 2023-10-16T14:01:50-0400) Atrial Fibrillation occurred continuously (100% burden), ranging from 37-93 bpm (avg of 60 bpm). Isolated VEs were occasional (1.7%, 4657), VE Couplets were rare (<1.0%, 456), and VE Triplets were rare (<1.0%, 2).   ECHO TEE  Result Date: 05/22/2022    TRANSESOPHOGEAL ECHO REPORT   Patient Name:   Micheal [name] Date of Exam: 05/22/2022 Medical Rec #:  [id number]       Height:       68.0 in Accession #:    [id number]       Weight:       145.0 lb Date of Birth:  1940/10/23       BSA:          1.783 m Patient Age:    81 years        BP:           132/69 mmHg Patient Gender: M               HR:           62 bpm. Exam Location:  Inpatient Procedure: Transesophageal Echo, 3D Echo, Cardiac Doppler and Color Doppler Indications:     Nonrheumatic mitral valve insufficiency [I34.0 (ICD-10-CM)];                  S/P mitral valve clip implantation [X32.440, Z95.818                  (ICD-10-CM)]  History:         Patient has prior history of Echocardiogram examinations, most                  recent 05/05/2022. CAD; Risk Factors:Hypertension, Dyslipidemia                  and  Diabetes. Mitral edge to edge repair with one XTW clip                  positioned on A2/P2 placed on 05/10/2021.                   Mitral Valve: valve is present in the mitral position.                  Procedure Date: 05/10/2021.  Sonographer:     Leta Jungling RDCS Referring Phys:  Lyn Records Diagnosing Phys: Lennie Odor MD PROCEDURE: After discussion of the risks and benefits of a TEE, an informed consent was obtained from the patient. TEE procedure time was 23 minutes. The transesophogeal probe was passed without difficulty through the esophogus of the patient. Imaged were obtained with the patient in a left lateral decubitus position. Sedation performed by different physician. The patient was monitored while under deep sedation. Anesthestetic sedation was provided intravenously by Anesthesiology: 500mg  of Propofol, 30mg  of Lidocaine. Image quality was excellent. The patient's vital signs; including heart rate, blood pressure, and oxygen saturation; remained stable throughout the procedure. The patient developed no complications during the procedure. IMPRESSIONS  1. Mitral edge to edge repair with one XTW clip positioned on A2-P2. There is severe residual MR related to persistent bileaflet prolapse of the lateral A1-P1 segments. There does not appear to be any flail  segments. There is systolic reversal of flow in the left upper pulmonary vein. 2D PISA radius 0.8 cm, 2D ERO 0.28 cm2, R vol 51 cc, RF 62%. 3D VCA 0.73 cm2. All consistent with severe MR. PMVL 14 mm. Medial orifice and lateral orifice 2 mmHG. 3D MVA of medial orifice 3.57 cm2 and lateral orifice 1.10 cm2. The mitral valve is abnormal. Severe mitral valve regurgitation. There is a present in the mitral position. Procedure Date: 05/10/2021.  2. Left ventricular ejection fraction, by estimation, is 60 to 65%. The left ventricle has low normal function. The left ventricle has no regional wall motion abnormalities.  3. Right ventricular systolic function is mildly reduced. The right ventricular size is moderately enlarged.  4. Left atrial size was severely dilated. No left atrial/left atrial appendage thrombus was detected.  5. Right atrial size was severely dilated.  6. Tricuspid valve regurgitation is moderate to severe.  7. The aortic valve is tricuspid. Aortic valve regurgitation is trivial. No aortic stenosis is present.  8. There is mild (Grade II) layered plaque involving the descending aorta. FINDINGS  Left Ventricle: Left ventricular ejection fraction, by estimation, is 60 to 65%. The left ventricle has low normal function. The left ventricle has no regional wall motion abnormalities. The left ventricular internal cavity size was normal in size. There is no left ventricular hypertrophy. Right Ventricle: The right ventricular size is moderately enlarged. No increase in right ventricular wall thickness. Right ventricular systolic function is mildly reduced. Left Atrium: Left atrial size was severely dilated. No left atrial/left atrial appendage thrombus was detected. Right Atrium: Right atrial size was severely dilated. Pericardium: There is no evidence of pericardial effusion. Mitral Valve: Mitral edge to edge repair with one XTW clip positioned on A2-P2. There is severe residual MR related to persistent  bileaflet prolapse of the lateral A1-P1 segments. There does not appear to be any flail segments. There is systolic reversal  of flow in the left upper pulmonary vein. 2D PISA radius 0.8 cm, 2D ERO 0.28 cm2, R vol 51 cc, RF 62%. 3D VCA  0.73 cm2. All consistent with severe MR. PMVL 14 mm. Medial orifice and lateral orifice 2 mmHG. 3D MVA of medial orifice 3.57 cm2 and lateral orifice 1.10 cm2. The mitral valve is abnormal. Mild mitral annular calcification. Severe mitral valve regurgitation. There is a present in the mitral position. Procedure Date: 05/10/2021. MV peak gradient, 9.6 mmHg. The mean mitral valve gradient is 2.0  mmHg. Tricuspid Valve: The tricuspid valve is grossly normal. Tricuspid valve regurgitation is moderate to severe. No evidence of tricuspid stenosis. Aortic Valve: The aortic valve is tricuspid. Aortic valve regurgitation is trivial. No aortic stenosis is present. Pulmonic Valve: The pulmonic valve was normal in structure. Pulmonic valve regurgitation is mild. No evidence of pulmonic stenosis. Aorta: The aortic root is normal in size and structure. There is mild (Grade II) layered plaque involving the descending aorta. Venous: A pattern of systolic flow reversal, suggestive of severe mitral regurgitation is recorded from the left upper pulmonary vein. IAS/Shunts: No atrial level shunt detected by color flow Doppler.  LEFT VENTRICLE PLAX 2D LVOT diam:     2.10 cm LV SV:         31 LV SV Index:   17 LVOT Area:     3.46 cm  AORTIC VALVE LVOT Vmax:   50.07 cm/s LVOT Vmean:  32.876 cm/s LVOT VTI:    0.089 m  AORTA Ao Root diam: 3.72 cm Ao Asc diam:  3.06 cm MITRAL VALVE                  TRICUSPID VALVE MV Peak grad: 9.6 mmHg        TR Peak grad:   33.4 mmHg MV Mean grad: 2.0 mmHg        TR Vmax:        289.00 cm/s MV Vmax:      1.55 m/s MV Vmean:     57.8 cm/s       SHUNTS MR Peak grad:    134.6 mmHg   Systemic VTI:  0.09 m MR Mean grad:    83.0 mmHg    Systemic Diam: 2.10 cm MR Vmax:          580.00 cm/s MR Vmean:        433.0 cm/s MR PISA:         4.02 cm MR PISA Eff ROA: 28 mm MR PISA Radius:  0.80 cm Lennie Odor MD Electronically signed by Lennie Odor MD Signature Date/Time: 05/22/2022/12:15:50 PM    Final    ECHOCARDIOGRAM COMPLETE  Result Date: 05/05/2022    ECHOCARDIOGRAM REPORT   Patient Name:   Micheal [name] Date of Exam: 05/05/2022 Medical Rec #:  [id number]       Height:       68.0 in Accession #:    [id number]      Weight:       149.0 lb Date of Birth:  [birth year]       BSA:          1.803 m Patient Age:    81 years        BP:           145/62 mmHg Patient Gender: M               HR:           66 bpm. Exam Location:  [address] Procedure: 2D Echo, Cardiac Doppler, Color Doppler and 3D Echo Indications:    s/p MV Clip Z98.89  History:  Patient has prior history of Echocardiogram examinations, most                 recent 08/19/2021. CAD, Prior CABG; Risk Factors:Hypertension,                 Dyslipidemia and Diabetes.                  Mitral Valve: Mitra-Clip valve is present in the mitral                 position. Procedure Date: 05/09/2021.  Sonographer:    Mikki Santee RDCS Referring Phys: [name] IMPRESSIONS  1. There is a Mitra-Clip XTW Clip (A2/P2) present in the mitral position. Procedure Date: 05/09/2021. The mitral clip appears to be well-seated at A2/P2 with eccentric posteriorly directed mitral regurgiation.  2. Right ventricular systolic function is moderately reduced. The right ventricular size is moderately enlarged. There is severely elevated pulmonary artery systolic pressure.  3. Left ventricular ejection fraction by 3D volume is 54 %. The left ventricle has low normal function. The left ventricle demonstrates global hypokinesis. There is mild asymmetric left ventricular hypertrophy of the septal segment. Left ventricular diastolic parameters are indeterminate.  4. The aortic valve is tricuspid. Aortic valve regurgitation is not visualized.  No aortic stenosis is present.  5. The inferior vena cava is dilated in size with <50% respiratory variability, suggesting right atrial pressure of 15 mmHg.  6. Tricuspid valve regurgitation is mild to moderate.  7. Left atrial size was severely dilated.  8. Right atrial size was severely dilated. Comparison(s): A prior study was performed on 08/19/2021. Ef is now low normal with worsened mitral regurgitation. FINDINGS  Left Ventricle: Left ventricular ejection fraction by 3D volume is 54 %. The left ventricle has low normal function. The left ventricle demonstrates global hypokinesis. The left ventricular internal cavity size was normal in size. There is mild asymmetric left ventricular hypertrophy of the septal segment. Left ventricular diastolic parameters are indeterminate. Right Ventricle: The right ventricular size is moderately enlarged. No increase in right ventricular wall thickness. Right ventricular systolic function is moderately reduced. There is severely elevated pulmonary artery systolic pressure. The tricuspid regurgitant velocity is 3.38 m/s, and with an assumed right atrial pressure of 15 mmHg, the estimated right ventricular systolic pressure is 16.1 mmHg. Left Atrium: Left atrial size was severely dilated. Right Atrium: Right atrial size was severely dilated. Pericardium: There is no evidence of pericardial effusion. Presence of epicardial fat layer. Mitral Valve: Status post mitral-clip XTW - which appears to be well seated ( A2/P2). There is moderate eccentric posteriorly directed mitral regurgitation. The mitral valve has been repaired/replaced. Moderate mitral valve regurgitation, with eccentric posteriorly directed jet. There is a Mitra-Clip present in the mitral position. Procedure Date: 05/09/2021. No evidence of mitral valve stenosis. MV peak gradient, 15.2 mmHg. The mean mitral valve gradient is 4.0 mmHg. Tricuspid Valve: The tricuspid valve is normal in structure. Tricuspid valve  regurgitation is mild to moderate. No evidence of tricuspid stenosis. Aortic Valve: The aortic valve is tricuspid. Aortic valve regurgitation is not visualized. No aortic stenosis is present. Pulmonic Valve: The pulmonic valve was not well visualized. Pulmonic valve regurgitation is mild. No evidence of pulmonic stenosis. Aorta: The aortic root is normal in size and structure. Venous: The inferior vena cava is dilated in size with less than 50% respiratory variability, suggesting right atrial pressure of 15 mmHg. IAS/Shunts: No atrial level shunt detected by color flow Doppler.  LEFT VENTRICLE PLAX 2D LVIDd:         4.60 cm         Diastology LVIDs:         3.40 cm         LV e' medial:    6.65 cm/s LV PW:         1.00 cm         LV E/e' medial:  17.1 LV IVS:        1.10 cm         LV e' lateral:   9.89 cm/s LVOT diam:     2.30 cm         LV E/e' lateral: 11.5 LV SV:         65 LV SV Index:   36 LVOT Area:     4.15 cm        3D Volume EF                                LV 3D EF:    Left                                             ventricul                                             ar                                             ejection                                             fraction                                             by 3D                                             volume is                                             54 %.                                 3D Volume EF:                                3D EF:        54 %  LV EDV:       143 ml                                LV ESV:       66 ml                                LV SV:        77 ml RIGHT VENTRICLE RV Basal diam:  4.70 cm RV Mid diam:    3.80 cm RV S prime:     6.57 cm/s TAPSE (M-mode): 1.1 cm LEFT ATRIUM              Index        RIGHT ATRIUM           Index LA diam:        5.10 cm  2.83 cm/m   RA Area:     25.50 cm LA Vol (A2C):   105.0 ml 58.22 ml/m  RA Volume:   85.40 ml  47.35 ml/m LA Vol (A4C):   84.4  ml  46.80 ml/m LA Biplane Vol: 96.7 ml  53.62 ml/m  AORTIC VALVE LVOT Vmax:   66.90 cm/s LVOT Vmean:  41.400 cm/s LVOT VTI:    0.157 m  AORTA Ao Root diam: 3.40 cm Ao Asc diam:  3.10 cm MITRAL VALVE                  TRICUSPID VALVE MV Area (PHT): 2.99 cm       TR Peak grad:   45.7 mmHg MV Area VTI:   1.43 cm       TR Vmax:        338.00 cm/s MV Peak grad:  15.2 mmHg MV Mean grad:  4.0 mmHg       SHUNTS MV Vmax:       1.95 m/s       Systemic VTI:  0.16 m MV Vmean:      80.6 cm/s      Systemic Diam: 2.30 cm MV Decel Time: 254 msec MR Peak grad:    125.4 mmHg MR Mean grad:    72.0 mmHg MR Vmax:         560.00 cm/s MR Vmean:        396.0 cm/s MR PISA:         1.57 cm MR PISA Eff ROA: 11 mm MR PISA Radius:  0.50 cm MV E velocity: 114.00 cm/s MV A velocity: 28.10 cm/s MV E/A ratio:  4.06 Kardie Tobb DO Electronically signed by Thomasene Ripple DO Signature Date/Time: 05/05/2022/11:30:04 AM    Final    Disposition   Pt is being discharged home today in good condition.  Follow-up Plans & Appointments    Follow-up Information     Filbert Schilder, NP Follow up on 06/11/2022.   Specialty: Cardiology Why: @ 8:45am. Please arrive at 8:30am. Contact information: [address] [place] [address] [phone]                Discharge Instructions     Call MD for:  difficulty breathing, headache or visual disturbances   Complete by: As directed    Call MD for:  extreme fatigue   Complete by: As directed    Call MD for:  hives   Complete by: As  directed    Call MD for:  persistant dizziness or light-headedness   Complete by: As directed    Call MD for:  persistant nausea and vomiting   Complete by: As directed    Call MD for:  redness, tenderness, or signs of infection (pain, swelling, redness, odor or green/yellow discharge around incision site)   Complete by: As directed    Call MD for:  severe uncontrolled pain   Complete by: As directed    Call MD for:  temperature >100.4    Complete by: As directed    Diet - low sodium heart healthy   Complete by: As directed    Discharge instructions   Complete by: As directed    Home Care Following Your MitraClip Procedure      If you have any questions or concerns you can call the structural heart office at [phone] during normal business hours 8am-4pm. If you have an urgent need after hours or on the weekend, please call [phone] to talk to the on call provider for general cardiology. If you have an emergency that requires immediate attention, please call 911.   Groin Site Care Refer to this sheet in the next few weeks. These instructions provide you with information on caring for yourself after your procedure. Your caregiver may also give you more specific instructions. Your treatment has been planned according to current medical practices, but problems sometimes occur. Call your caregiver if you have any problems or questions after your procedure. HOME CARE INSTRUCTIONS You may shower 24 hours after the procedure. Remove the bandage (dressing) and gently wash the site with plain soap and water. Gently pat the site dry.  Do not apply powder or lotion to the site.  Do not sit in a bathtub, swimming pool, or whirlpool for 5 to 7 days.  No bending, squatting, or lifting anything over 10 pounds (4.5 kg) as directed by your caregiver.  Inspect the site at least twice daily.  Do not drive home if you are discharged the same day of the procedure. Have someone else drive you.  You may drive 72 hours after the procedure unless otherwise instructed by your caregiver.  What to expect: Any bruising will usually fade within 1 to 2 weeks.  Blood that collects in the tissue (hematoma) may be painful to the touch. It should usually decrease in size and tenderness within 1 to 2 weeks.  SEEK IMMEDIATE MEDICAL CARE IF: You have unusual pain at the groin site or down the affected leg.  You have redness, warmth, swelling, or pain at  the groin site.  You have drainage (other than a small amount of blood on the dressing).  You have chills.  You have a fever or persistent symptoms for more than 72 hours.  You have a fever and your symptoms suddenly get worse.  Your leg becomes pale, cool, tingly, or numb.  You have bleeding from the site. Hold pressure on the site until it subsides.    After MitraClip Checklist  Check  Test Description  Follow up appointment in 1-2 weeks  Most of our patients will see our structural heart APP or your primary cardiologist within 1-2 weeks. Your incision site will be checked and you will be cleared to resume all normal activities if you are doing well.    1 month echo and follow up  You will have an echo to check on your heart valve clip and be seen back in the office by our  structural heart APP  Follow up with your primary cardiologist You will need to be seen by your primary cardiologist in the following 3-6 months after your 1 month appointment in the valve clinic. Often times your blood thinners will be changed. This is decided on a case by case basis.   1 year echo and follow up You will have another echo to check on your heart valve after one year and be seen back in the office by our structural heart APP. This your last structural heart visit.  Bacterial endocarditis prophylaxis  You will have to take antibiotics for the rest of your life before all dental procedures (even dental cleanings) to protect your heart valve from potential infection. Antibiotics are also required before some surgeries. Please check with your cardiologist before scheduling any surgeries. Also, please make sure to tell us if you have a penicillin allergy as you will require an alternative antibiotic.   ______________  Your Implant Identification Card Following your procedure, you will receive an Implant Identification Card, which your doctor will fill out and which you must carry with you at all times. Show your  Implant Identification Card if you report to an emergency room. This card identifies you as a patient who has had a MitraClip device implanted. If you require a magnetic resonance imaging (MRI) scan, tell your doctor or MRI technician that you have a MitraClip device implanted. Test results indicate that patients with the MitraClip device can safely undergo MRI scans under certain conditions described on the card.   Increase activity slowly   Complete by: As directed       Discharge Medications   Allergies as of 05/30/2022   No Known Allergies      Medication List     TAKE these medications    amoxicillin 500 MG tablet Commonly known as: AMOXIL Take 4 tablets (2,000 mg) one hour prior to all dental visits.   CINNAMON PO Take 1,000 mg by mouth daily.   Co Q-10 200 MG Caps Take 200 mg by mouth daily.   cyanocobalamin 1000 MCG tablet Commonly known as: VITAMIN B12 Take 1,000 mcg by mouth daily.   dorzolamide-timolol 2-0.5 % ophthalmic solution Commonly known as: COSOPT Place 1 drop into both eyes 2 (two) times daily.   Eliquis 5 MG Tabs tablet Generic drug: apixaban TAKE 1 TABLET BY MOUTH TWICE A DAY   FISH OIL PO Take 1 capsule by mouth daily.   furosemide 20 MG tablet Commonly known as: LASIX TAKE 1 TABLET BY MOUTH EVERY DAY   GLUCOSAMINE CHONDR COMPLEX PO Take 1 tablet by mouth daily.   isosorbide mononitrate 30 MG 24 hr tablet Commonly known as: IMDUR TAKE 1 TABLET BY MOUTH EVERY DAY   Klor-Con M20 20 MEQ tablet Generic drug: potassium chloride SA TAKE 1 TABLET BY MOUTH EVERY DAY   L-Carnitine 500 MG Tabs Take 500 mg by mouth in the morning.   losartan-hydrochlorothiazide 50-12.5 MG tablet Commonly known as: HYZAAR TAKE 1 TABLET BY MOUTH EVERY DAY   metFORMIN 1000 MG tablet Commonly known as: GLUCOPHAGE Take 1,000 mg by mouth at bedtime.   metoprolol succinate 25 MG 24 hr tablet Commonly known as: Toprol XL Take 1 tablet (25 mg total) by mouth  3 (three) times a week. Take on Mondays, Wednesdays, and Fridays.   NF Formulas Testosterone Caps Take 1 capsule by mouth in the morning.   pravastatin 20 MG tablet Commonly known as: PRAVACHOL Take 20 mg by mouth at  bedtime.   PRESERVISION AREDS 2 PO Take 1 capsule by mouth 2 (two) times daily.   QUERCETIN PO Take 1 tablet by mouth daily.   Systane Balance 0.6 % Soln Generic drug: Propylene Glycol Place 1 drop into both eyes daily as needed (dry eyes).   Vitamin D 50 MCG (2000 UT) tablet Take 2,000 Units by mouth in the morning and at bedtime.   vitamin E 180 MG (400 UNITS) capsule Take 400 Units by mouth daily.   Vyzulta 0.024 % Soln Generic drug: Latanoprostene Bunod Place 1 drop into the right eye at bedtime.       Outstanding Labs/Studies   None   Duration of Discharge Encounter   Greater than 30 minutes including physician time.  SignedGeorgie Chard, NP 05/30/2022, 10:05 AM [phone]   Patient seen, examined. Available data reviewed. Agree with findings, assessment, and plan as outlined by Georgie Chard, NP.  The patient is independently interviewed and examined.  He is alert, oriented, in no distress.  Lungs are clear, heart is irregularly irregular with no murmur gallop, abdomen is soft nontender, right groin site is clear with no hematoma or ecchymosis, lower extremities have no edema.  Telemetry is reviewed and demonstrates atrial fibrillation with well-controlled ventricular rate.  The patient's postoperative day #1 echocardiogram is reviewed.  The formal interpretation is pending.  The MitraClip devices are intact with only trivial residual MR.  The transmitral mean gradient is 3 mmHg.  The patient is back on apixaban.  He notes a marked improvement in his breathing.  He is medically stable for hospital discharge today with follow-up as outlined above.  Tonny Bollman, M.D. 05/30/2022 11:44 AM

## 2022-05-29 NOTE — Interval H&P Note (Signed)
History and Physical Interval Note:  05/29/2022 7:33 AM  Micheal [name]  has presented today for surgery, with the diagnosis of severe mitral regurgitation.  The various methods of treatment have been discussed with the patient and family. After consideration of risks, benefits and other options for treatment, the patient has consented to  Procedure(s): MITRAL VALVE REPAIR (N/A) TRANSESOPHAGEAL ECHOCARDIOGRAM (TEE) (N/A) as a surgical intervention.  The patient's history has been reviewed, patient examined, no change in status, stable for surgery.  I have reviewed the patient's chart and labs.  Questions were answered to the patient's satisfaction.     [name]

## 2022-05-29 NOTE — Progress Notes (Signed)
Patient resting comfortably at this time. Rt groin remains stable with no active bleeding or hematoma noted. Lt Radial site sheath removed and tegaderm appled. Site soft with no active bleeding or hematoma noted. VSS. Will continue to monitor patient.

## 2022-05-29 NOTE — Transfer of Care (Signed)
Immediate Anesthesia Transfer of Care Note  Patient: Micheal [name]  Procedure(s) Performed: MITRAL VALVE REPAIR TRANSESOPHAGEAL ECHOCARDIOGRAM (TEE)  Patient Location: Cath Lab  Anesthesia Type:General  Level of Consciousness: awake, alert , oriented, and patient cooperative  Airway & Oxygen Therapy: Patient Spontanous Breathing and Patient connected to nasal cannula oxygen  Post-op Assessment: Report given to RN, Post -op Vital signs reviewed and stable, and Patient moving all extremities X 4  Post vital signs: Reviewed and stable  Last Vitals:  Vitals Value Taken Time  BP 152/63 05/29/22 1006  Temp 36.4 C 05/29/22 1006  Pulse 62 05/29/22 1007  Resp 20 05/29/22 1007  SpO2 98 % 05/29/22 1007  Vitals shown include unvalidated device data.  Last Pain:  Vitals:   05/29/22 1006  TempSrc: Temporal  PainSc: 0-No pain         Complications: There were no known notable events for this encounter.

## 2022-05-29 NOTE — Plan of Care (Signed)
  Problem: Education: Goal: Knowledge of General Education information will improve Description: Including pain rating scale, medication(s)/side effects and non-pharmacologic comfort measures Outcome: Progressing   Problem: Health Behavior/Discharge Planning: Goal: Ability to manage health-related needs will improve Outcome: Progressing   Problem: Clinical Measurements: Goal: Ability to maintain clinical measurements within normal limits will improve Outcome: Progressing Goal: Will remain free from infection Outcome: Progressing Goal: Diagnostic test results will improve Outcome: Progressing Goal: Respiratory complications will improve Outcome: Progressing Goal: Cardiovascular complication will be avoided Outcome: Progressing   Problem: Activity: Goal: Risk for activity intolerance will decrease Outcome: Progressing   Problem: Nutrition: Goal: Adequate nutrition will be maintained Outcome: Progressing   Problem: Coping: Goal: Level of anxiety will decrease Outcome: Progressing   Problem: Pain Managment: Goal: General experience of comfort will improve Outcome: Progressing   Problem: Elimination: Goal: Will not experience complications related to bowel motility Outcome: Progressing Goal: Will not experience complications related to urinary retention Outcome: Progressing   Problem: Safety: Goal: Ability to remain free from injury will improve Outcome: Progressing   Problem: Skin Integrity: Goal: Risk for impaired skin integrity will decrease Outcome: Progressing   Problem: Education: Goal: Understanding of CV disease, CV risk reduction, and recovery process will improve Outcome: Progressing Goal: Individualized Educational Video(s) Outcome: Progressing   Problem: Activity: Goal: Ability to return to baseline activity level will improve Outcome: Progressing   Problem: Cardiovascular: Goal: Ability to achieve and maintain adequate cardiovascular perfusion  will improve Outcome: Progressing Goal: Vascular access site(s) Level 0-1 will be maintained Outcome: Progressing   Problem: Health Behavior/Discharge Planning: Goal: Ability to safely manage health-related needs after discharge will improve Outcome: Progressing   Problem: Education: Goal: Knowledge of cardiac device and self-care will improve Outcome: Progressing Goal: Ability to safely manage health related needs after discharge will improve Outcome: Progressing Goal: Individualized Educational Video(s) Outcome: Progressing   Problem: Cardiac: Goal: Ability to achieve and maintain adequate cardiopulmonary perfusion will improve Outcome: Progressing   Problem: Coping: Goal: Ability to adjust to condition or change in health will improve Outcome: Progressing   Problem: Fluid Volume: Goal: Ability to maintain a balanced intake and output will improve Outcome: Progressing   Problem: Health Behavior/Discharge Planning: Goal: Ability to identify and utilize available resources and services will improve Outcome: Progressing Goal: Ability to manage health-related needs will improve Outcome: Progressing   Problem: Metabolic: Goal: Ability to maintain appropriate glucose levels will improve Outcome: Progressing   Problem: Nutritional: Goal: Maintenance of adequate nutrition will improve Outcome: Progressing Goal: Progress toward achieving an optimal weight will improve Outcome: Progressing

## 2022-05-29 NOTE — Progress Notes (Signed)
  [facility] TEAM  Patient doing well s/p TEER. He is hemodynamically stable. Groin site is stable. Arterial line discontinued and patient to be transferred to [facility].  Plan for early ambulation after bedrest completed and hopeful discharge over the next 24 hours.   Kathyrn Drown NP-C Structural Heart Team  Pager: [phone] Phone: [phone]

## 2022-05-29 NOTE — Op Note (Signed)
    PROCEDURE:  Transcatheter edge to edge mitral valve repair (TEER) INDICATION: Severe symptomatic mitral regurgitation (Stage D)  SURGEON:  [name], MD CO-SURGEON: Lenna Sciara, MD  PROCEDURAL DETAILS: General anesthesia is induced.  The patient is prepped and draped.  Baseline transesophageal echo images are obtained and confirm appropriate anatomy for transcatheter edge-to-edge mitral valve repair.  Using vascular ultrasound guidance, the right common femoral vein is accessed via a front wall puncture.  2 Perclose sutures are deployed and an 8 [place] sheath is inserted.  A versa cross wire is advanced into the SVC.  Heparin is administered and a therapeutic ACT is achieved.  Transseptal puncture is performed over the mid posterior portion of the fossa.  The septum is dilated while the MitraClip steerable guide catheter is prepped.  After progressively dilating the right common femoral vein, the 24 French MitraClip steerable guide catheter is inserted and advanced across the interatrial septum.  The dilator and the versa cross wire were carefully removed and the guide tip is appropriately positioned approximately 3 cm across the interatrial septum.  A MitraClip NT device is prepped per protocol.  The device is inserted through the steerable guide catheter with caution taken to avoid air entrapment.  The MitraClip device is positioned above the mitral valve after applying appropriate curve to the guide catheter.  The MitraClip device is then oriented coaxially with the anterior and posterior leaflets of the mitral valve.  Low tidal volume ventilation is initiated.  The clip device is advanced across the mitral valve and pulled back until capture of both the anterior and posterior leaflets is achieved.  Grippers are dropped and the clip is closed under TEE guidance.  Careful TEE assessment is performed and reduction in mitral valve regurgitation is felt to be appropriate.  Leaflet insertion is  verified using 3D imaging.  The MitraClip device is deployed using normal technique and the clip delivery system is removed.  After full TEE assessment, the procedural result is felt to be adequate with reduction of mitral regurgitation to trace.    PROCEDURE COMPLETION: The steerable guide catheter is pulled back into the right atrium and the interatrial septum is assessed with TEE.  There is no significant septal injury seen and no right to left shunting.  The guide catheter is removed and the Perclose sutures are tightened.    CONCLUSION: Successful transcatheter edge-to-edge mitral valve repair under fluoroscopic and echo guidance, reducing baseline 4+ mitral regurgitation to trace, clip positioned A1/P1.  [name] 05/29/2022 10:01 AM

## 2022-05-29 NOTE — Interval H&P Note (Signed)
History and Physical Interval Note:  05/29/2022 8:11 AM  Micheal [name]  has presented today for surgery, with the diagnosis of severe mitral regurgitation.  The various methods of treatment have been discussed with the patient and family. After consideration of risks, benefits and other options for treatment, the patient has consented to  Procedure(s): MITRAL VALVE REPAIR (N/A) TRANSESOPHAGEAL ECHOCARDIOGRAM (TEE) (N/A) as a surgical intervention.  The patient's history has been reviewed, patient examined, no change in status, stable for surgery.  I have reviewed the patient's chart and labs.  Questions were answered to the patient's satisfaction.    Pt with NYHA functional class 3 symptoms, progressive dyspnea, and elevated BNP consistent with acute on chronic diastolic CHF.  [name]

## 2022-05-29 NOTE — Anesthesia Procedure Notes (Signed)
Procedure Name: Intubation Date/Time: 05/29/2022 8:05 AM  Performed by: Colin Benton, CRNAPre-anesthesia Checklist: Patient identified, Emergency Drugs available, Suction available and Patient being monitored Patient Re-evaluated:Patient Re-evaluated prior to induction Oxygen Delivery Method: Circle system utilized Preoxygenation: Pre-oxygenation with 100% oxygen Induction Type: IV induction Ventilation: Mask ventilation without difficulty Laryngoscope Size: 4 and Mac Grade View: Grade I Tube type: Oral Tube size: 7.5 mm Number of attempts: 1 Airway Equipment and Method: Stylet Placement Confirmation: ETT inserted through vocal cords under direct vision, positive ETCO2 and breath sounds checked- equal and bilateral Secured at: 23 cm Tube secured with: Tape Dental Injury: Teeth and Oropharynx as per pre-operative assessment

## 2022-05-29 NOTE — Anesthesia Procedure Notes (Addendum)
Arterial Line Insertion Start/End11/08/2021 7:00 AM, 05/29/2022 7:15 AM Performed by: Josephine Igo, CRNA, CRNA  Preanesthetic checklist: patient identified and IV checked Lidocaine 1% used for infiltration Right, radial was placed Catheter size: 20 G Hand hygiene performed  and maximum sterile barriers used   Attempts: 1 Procedure performed without using ultrasound guided technique. Following insertion, dressing applied and Biopatch. Post procedure assessment: normal  Patient tolerated the procedure well with no immediate complications.

## 2022-05-29 NOTE — Anesthesia Postprocedure Evaluation (Signed)
Anesthesia Post Note  Patient: RANDEN KAUTH  Procedure(s) Performed: MITRAL VALVE REPAIR TRANSESOPHAGEAL ECHOCARDIOGRAM (TEE)     Patient location during evaluation: Cath Lab Anesthesia Type: General Level of consciousness: awake and alert, patient cooperative and oriented Pain management: pain level controlled Vital Signs Assessment: post-procedure vital signs reviewed and stable Respiratory status: spontaneous breathing, nonlabored ventilation and respiratory function stable Cardiovascular status: blood pressure returned to baseline and stable Postop Assessment: no apparent nausea or vomiting and adequate PO intake Anesthetic complications: no   There were no known notable events for this encounter.  Last Vitals:  Vitals:   05/29/22 1345 05/29/22 1410  BP: 109/62 130/68  Pulse: 72 72  Resp: 18 17  Temp:    SpO2: 98% 98%    Last Pain:  Vitals:   05/29/22 1006  TempSrc: Temporal  PainSc: 0-No pain                 Taylee Gunnells,E. Jayceion Lisenby

## 2022-05-30 ENCOUNTER — Inpatient Hospital Stay (HOSPITAL_COMMUNITY): Payer: Medicare Other

## 2022-05-30 DIAGNOSIS — Z954 Presence of other heart-valve replacement: Secondary | ICD-10-CM

## 2022-05-30 DIAGNOSIS — Z20822 Contact with and (suspected) exposure to covid-19: Secondary | ICD-10-CM | POA: Diagnosis not present

## 2022-05-30 DIAGNOSIS — Z006 Encounter for examination for normal comparison and control in clinical research program: Secondary | ICD-10-CM | POA: Diagnosis not present

## 2022-05-30 DIAGNOSIS — I340 Nonrheumatic mitral (valve) insufficiency: Principal | ICD-10-CM

## 2022-05-30 DIAGNOSIS — I5033 Acute on chronic diastolic (congestive) heart failure: Secondary | ICD-10-CM | POA: Diagnosis not present

## 2022-05-30 LAB — BASIC METABOLIC PANEL
Anion gap: 6 (ref 5–15)
BUN: 22 mg/dL (ref 8–23)
CO2: 28 mmol/L (ref 22–32)
Calcium: 9 mg/dL (ref 8.9–10.3)
Chloride: 105 mmol/L (ref 98–111)
Creatinine, Ser: 1.27 mg/dL — ABNORMAL HIGH (ref 0.61–1.24)
GFR, Estimated: 56 mL/min — ABNORMAL LOW (ref >60)
Glucose, Bld: 118 mg/dL — ABNORMAL HIGH (ref 70–99)
Potassium: 4.6 mmol/L (ref 3.5–5.1)
Sodium: 139 mmol/L (ref 135–145)

## 2022-05-30 LAB — GLUCOSE, CAPILLARY: Glucose-Capillary: 103 mg/dL — ABNORMAL HIGH (ref 70–99)

## 2022-05-30 LAB — CBC
HCT: 37 % — ABNORMAL LOW (ref 39.0–52.0)
Hemoglobin: 12.5 g/dL — ABNORMAL LOW (ref 13.0–17.0)
MCH: 34.9 pg — ABNORMAL HIGH (ref 26.0–34.0)
MCHC: 33.8 g/dL (ref 30.0–36.0)
MCV: 103.4 fL — ABNORMAL HIGH (ref 80.0–100.0)
Platelets: 160 10*3/uL (ref 150–400)
RBC: 3.58 MIL/uL — ABNORMAL LOW (ref 4.22–5.81)
RDW: 14.5 % (ref 11.5–15.5)
WBC: 8.2 10*3/uL (ref 4.0–10.5)
nRBC: 0 % (ref 0.0–0.2)

## 2022-05-30 LAB — ECHOCARDIOGRAM COMPLETE
AR max vel: 2.13 cm2
AV Area VTI: 2.33 cm2
AV Area mean vel: 2.02 cm2
AV Mean grad: 2 mmHg
AV Peak grad: 4.5 mmHg
Ao pk vel: 1.06 m/s
Height: 68 in
MV VTI: 1.07 cm2
S' Lateral: 2.7 cm
Weight: 2412.78 oz

## 2022-05-30 LAB — POCT ACTIVATED CLOTTING TIME
Activated Clotting Time: 305 seconds
Activated Clotting Time: 251 seconds

## 2022-05-30 MED FILL — Norepinephrine-Dextrose IV Solution 4 MG/250ML-5%: INTRAVENOUS | Qty: 250 | Status: AC

## 2022-05-30 NOTE — Progress Notes (Addendum)
Pt had ambulated this morning w/o complaints, pt was educated on restrictions, ex guidelines, heart healthy, diabetic diet, and CRPII. Pt is not interested in CRPII at this time.     Micheal [name]  10:34 AM 05/30/2022

## 2022-05-30 NOTE — Progress Notes (Signed)
Mobility Specialist Progress Note    05/30/22 0927  Mobility  Activity Ambulated independently in hallway  Level of Assistance Independent  Assistive Device None  Distance Ambulated (ft) 430 ft  Activity Response Tolerated well  $Mobility charge 1 Mobility   Pre-Mobility: 52 HR, 109/81 (91) BP, 97% SpO2 During Mobility: 84 HR Post-Mobility: 75 HR  Pt received in bed and agreeable. Had void in BR. No complaints on walk. Returned to bed with call bell in reach.    Hildred Alamin Mobility Specialist  Secure Chat Only

## 2022-05-30 NOTE — TOC Transition Note (Signed)
Transition of Care [facility]) - CM/SW Discharge Note   Patient Details  Name: Micheal [name] MRN: [id number] Date of Birth: 1940-08-13  Transition of Care [facility]) CM/SW Contact:  Zenon Mayo, RN Phone Number: 05/30/2022, 10:27 AM   Clinical Narrative:     Patient is for dc today, he states [name] will transport him home today. He is indep, has no needs.         Patient Goals and CMS Choice        Discharge Placement                       Discharge Plan and Services                                     Social Determinants of Health (SDOH) Interventions     Readmission Risk Interventions     No data to display

## 2022-06-02 ENCOUNTER — Telehealth: Payer: Self-pay

## 2022-06-02 NOTE — Telephone Encounter (Signed)
Patient contacted regarding discharge from [facility] on 05/30/2022.   Patient understands to follow up with provider Kathyrn Drown on 06/11/2022 at 8:45AM at [facility].  Patient understands discharge instructions? Yes Patient understands medications and regimen? Yes Patient understands to bring all medications to this visit? Yes   The patient states he "has not felt this good in months." He states his groin site looks like nothing ever happened there. He did not have to take any naps over the weekend. He is very satisfied.

## 2022-06-10 NOTE — Progress Notes (Unsigned)
[facility]   MULTIDISCIPLINARY HEART VALVE CLINIC                                     Cardiology Office Note:    Date:  06/11/2022   ID:  Micheal [name], DOB 11/03/1940, MRN [id number]  PCP:  Lorenda Ishihara, MD  [facility] Cardiologist:  Lesleigh Noe, MD/ Dr. Excell Seltzer, MD & Dr. Lynnette Caffey, MD (TEER)  Referring MD: Lorenda Ishihara,*   Chief Complaint  Patient presents with   Follow-up    TOC s/p MitraClip    History of Present Illness:    Micheal [name] is a 81 y.o. male with a hx of CAD s/p CABG 2007, DM2, persistent atrial fibrillation on Eliquis, chronic CHF, PAD, and severe mitral regurgitation s/p TEER 04/2021 who developed worsening SOB and fatigue over the last several months found to have severe residual MR due to persistent bileaflet prolapse. He presented to [facility] for repeat Mitral valve edge to edge repair 05/29/22.    Micheal [name] was initially referred to Dr. Excell Seltzer 03/05/21 for consideration of treatment options regarding severe mitral regurgitation. He had recently been diagnosed with atrial fibrillation 02/24/21 when he presented with dyspnea, fatigue, and clinical signs of congestive heart failure. He underwent TEE guided cardioversion 02/26/21 and was found to have moderate/severe mitral regurgitation with bileaflet prolapse. He subsequently underwent right and left cardiac catheterization 04/03/2021 which demonstrated occlusion of the SBG>OM with severe disease in the native OM, however this was noted to be a very small vessel with recommendations for continued medical treatment. The SVG to PDA and PLA and the LIMA to LAD graft were both patent. There was 32 mmHg V waves in the wedge position and with moderate pulmonary hypertension, consistent with hemodynamically important mitral regurgitation.   He was seen by Dr. Excell Seltzer 04/08/21 with essentially unchanged symptoms of exertional dyspnea and fatigue. He was felt to be a good candidate for  transcatheter edge-to-edge mitral repair although he was referred for formal surgical consultation as part of a multidisciplinary team approach to his care. He saw Dr. Cliffton Asters 04/26/21 and was felt to be high risk for re-do sternotomy therefore felt better served with MitraClip placement.    He ultimately underwent successful transcatheter mitral edge to edge repair with one XTW clip positioned on A2/P2 reducing severe degenerative nonrheumatic mitral regurgitation to mild to moderate mitral regurgitation. Post clip recommendations were to restart home anticoagulation at prior dose.    He did well for some time after clip placement however was seen by Dr. Katrinka Blazing for regular follow up with exertional fatigue and SOB on 05/05/22. Repeat echocardiogram at that time showed normal LV function with worsening of his mitral regurgitation.  A TEE was recommended because of his progressive symptoms and change on surface echo.  This confirmed severe eccentric mitral regurgitation with a stable MitraClip device in the A2/P2 position, but the residual MR arises lateral to the clip and an area of prolapse. He was seen back with Dr. Excell Seltzer 05/23/22 with plans to proceed with additional Mitraclip placement scheduled for 05/28/22.   He did well in the post procedure setting and is here today for follow up. He continues to do well. His SOB and fatigue are improved. He played golf last weekend for three days straight and felt great. Groin sites are stable with no bleeding. He denies chest pain, palpitations, LE edema,  orthopnea, dizziness, or syncope.   Past Medical History:  Diagnosis Date   CAD (coronary artery disease)    with Cabg 2008   Claudication (HCC)    Diabetes (HCC)    HTN (hypertension)    Hypercholesteremia    Hyperlipidemia    S/P mitral valve clip implantation 05/09/2021   one XTW clip positioned on A2/P2 per Dr. Jorge Ny    Past Surgical History:  Procedure Laterality Date   ABDOMINAL  AORTOGRAM W/LOWER EXTREMITY N/A 09/22/2018   Procedure: ABDOMINAL AORTOGRAM W/LOWER EXTREMITY;  Surgeon: Iran Ouch, MD;  Location: [facility] INVASIVE CV LAB;  Service: Cardiovascular;  Laterality: N/A;   CARDIAC CATHETERIZATION  04/03/2021   CARDIOVERSION N/A 02/26/2021   Procedure: CARDIOVERSION;  Surgeon: Elease Hashimoto Deloris Ping, MD;  Location: [facility] ENDOSCOPY;  Service: Cardiovascular;  Laterality: N/A;   CORONARY ARTERY BYPASS GRAFT  2008   Coronary bypass surgery     2008   EYE SURGERY Bilateral    laser surgery by Dr. Jerolyn Center   MITRAL VALVE REPAIR N/A 05/09/2021   Procedure: MITRAL VALVE REPAIR;  Surgeon: Orbie Pyo, MD;  Location: [facility] INVASIVE CV LAB;  Service: Cardiovascular;  Laterality: N/A;   MITRAL VALVE REPAIR N/A 05/29/2022   Procedure: MITRAL VALVE REPAIR;  Surgeon: Tonny Bollman, MD;  Location: [facility] INVASIVE CV LAB;  Service: Cardiovascular;  Laterality: N/A;   RIGHT/LEFT HEART CATH AND CORONARY/GRAFT ANGIOGRAPHY N/A 04/03/2021   Procedure: RIGHT/LEFT HEART CATH AND CORONARY/GRAFT ANGIOGRAPHY;  Surgeon: Lyn Records, MD;  Location: [facility] INVASIVE CV LAB;  Service: Cardiovascular;  Laterality: N/A;   TEE WITHOUT CARDIOVERSION N/A 02/26/2021   Procedure: TRANSESOPHAGEAL ECHOCARDIOGRAM (TEE);  Surgeon: Elease Hashimoto Deloris Ping, MD;  Location: [facility] ENDOSCOPY;  Service: Cardiovascular;  Laterality: N/A;   TEE WITHOUT CARDIOVERSION N/A 05/09/2021   Procedure: TRANSESOPHAGEAL ECHOCARDIOGRAM (TEE);  Surgeon: Orbie Pyo, MD;  Location: [facility] INVASIVE CV LAB;  Service: Cardiovascular;  Laterality: N/A;   TEE WITHOUT CARDIOVERSION N/A 05/22/2022   Procedure: TRANSESOPHAGEAL ECHOCARDIOGRAM (TEE);  Surgeon: Sande Rives, MD;  Location: [facility] ENDOSCOPY;  Service: Cardiovascular;  Laterality: N/A;   TEE WITHOUT CARDIOVERSION N/A 05/29/2022   Procedure: TRANSESOPHAGEAL ECHOCARDIOGRAM (TEE);  Surgeon: Tonny Bollman, MD;  Location: [facility] INVASIVE CV LAB;  Service: Cardiovascular;  Laterality: N/A;     Current Medications: Current Meds  Medication Sig   amoxicillin (AMOXIL) 500 MG tablet Take 4 tablets (2,000 mg) one hour prior to all dental visits.   Cholecalciferol (VITAMIN D) 50 MCG (2000 UT) tablet Take 2,000 Units by mouth in the morning and at bedtime.   CINNAMON PO Take 1,000 mg by mouth daily.   Coenzyme Q10 (CO Q-10) 200 MG CAPS Take 200 mg by mouth daily.    dorzolamide-timolol (COSOPT) 22.3-6.8 MG/ML ophthalmic solution Place 1 drop into both eyes 2 (two) times daily.   ELIQUIS 5 MG TABS tablet TAKE 1 TABLET BY MOUTH TWICE A DAY   furosemide (LASIX) 20 MG tablet TAKE 1 TABLET BY MOUTH EVERY DAY   Glucosamine-Chondroitin (GLUCOSAMINE CHONDR COMPLEX PO) Take 1 tablet by mouth daily.   isosorbide mononitrate (IMDUR) 30 MG 24 hr tablet TAKE 1 TABLET BY MOUTH EVERY DAY   KLOR-CON M20 20 MEQ tablet TAKE 1 TABLET BY MOUTH EVERY DAY   levOCARNitine (L-CARNITINE) 500 MG TABS Take 500 mg by mouth in the morning.   losartan-hydrochlorothiazide (HYZAAR) 50-12.5 MG tablet TAKE 1 TABLET BY MOUTH EVERY DAY   metFORMIN (GLUCOPHAGE) 1000 MG tablet Take 1,000 mg by mouth at  bedtime.    metoprolol succinate (TOPROL XL) 25 MG 24 hr tablet Take 1 tablet (25 mg total) by mouth 3 (three) times a week. Take on Mondays, Wednesdays, and Fridays.   Misc Natural Products (NF FORMULAS TESTOSTERONE) CAPS Take 1 capsule by mouth in the morning.   Multiple Vitamins-Minerals (PRESERVISION AREDS 2 PO) Take 1 capsule by mouth 2 (two) times daily.   Omega-3 Fatty Acids (FISH OIL PO) Take 1 capsule by mouth daily.   pravastatin (PRAVACHOL) 20 MG tablet Take 20 mg by mouth at bedtime.   Propylene Glycol (SYSTANE BALANCE) 0.6 % SOLN Place 1 drop into both eyes daily as needed (dry eyes).   QUERCETIN PO Take 1 tablet by mouth daily.   vitamin B-12 (CYANOCOBALAMIN) 1000 MCG tablet Take 1,000 mcg by mouth daily.   vitamin E 400 UNIT capsule Take 400 Units by mouth daily.   VYZULTA 0.024 % SOLN Place 1 drop into  the right eye at bedtime.     Allergies:   Patient has no known allergies.   Social History   Socioeconomic History   Marital status: Widowed    Spouse name: Not on file   Number of children: Not on file   Years of education: Not on file   Highest education level: Not on file  Occupational History   Not on file  Tobacco Use   Smoking status: Former   Smokeless tobacco: Never  Vaping Use   Vaping Use: Never used  Substance and Sexual Activity   Alcohol use: Not Currently    Alcohol/week: 1.0 standard drink of alcohol    Types: 1 Glasses of wine per week   Drug use: No   Sexual activity: Yes  Other Topics Concern   Not on file  Social History Narrative   Not on file   Social Determinants of Health   Financial Resource Strain: Not on file  Food Insecurity: Not on file  Transportation Needs: Not on file  Physical Activity: Not on file  Stress: Not on file  Social Connections: Not on file     Family History: The patient's family history includes Healthy in his sister, sister, and sister; Heart attack in his maternal grandfather; Heart disease in his father.  ROS:   Please see the history of present illness.    All other systems reviewed and are negative.  EKGs/Labs/Other Studies Reviewed:    The following studies were reviewed today:  TEER 05/29/22:  Successful transcatheter edge-to-edge repair of the mitral valve using a single MitraClip NT device positioned A1/P1, reducing mitral regurgitation from 4+ at baseline to trace post procedure.    Echocardiogram 05/30/22:  S/p mitraclip with XTW device 05/09/21 (A2P2) followed by NT mitraclip 05/29/22 (A1P1); MR now trivial compared to previous; MV mean gradient 3 mmHg with LV EF at 60-65%   EKG:  EKG is not ordered today.    Recent Labs: 11/07/2021: NT-Pro BNP 4,141 05/26/2022: ALT 20; B Natriuretic Peptide 771.5 05/30/2022: BUN 22; Creatinine, Ser 1.27; Hemoglobin 12.5; Platelets 160; Potassium 4.6; Sodium 139    Recent Lipid Panel    Component Value Date/Time   CHOL 130 02/25/2021 0249   CHOL 86 (L) 10/10/2019 0744   TRIG 102 02/25/2021 0249   HDL 35 (L) 02/25/2021 0249   HDL 44 10/10/2019 0744   CHOLHDL 3.7 02/25/2021 0249   VLDL 20 02/25/2021 0249   LDLCALC 75 02/25/2021 0249   LDLCALC 24 10/10/2019 0744   Physical Exam:    VS:  BP 122/60   Pulse 60   Ht 5\' 7"  (1.702 m)   Wt 152 lb (68.9 kg)   BMI 23.81 kg/m     Wt Readings from Last 3 Encounters:  06/11/22 152 lb (68.9 kg)  05/29/22 150 lb 12.8 oz (68.4 kg)  05/23/22 150 lb 12.8 oz (68.4 kg)    General: Well developed, well nourished, NAD Neck: Negative for carotid bruits. No JVD Lungs:Clear to ausculation bilaterally. Breathing is unlabored. Cardiovascular: RRR with S1 S2. No murmurs Extremities: No edema.  Neuro: Alert and oriented. No focal deficits. No facial asymmetry. MAE spontaneously. Psych: Responds to questions appropriately with normal affect.    ASSESSMENT/PLAN:   Severe non-rheumatic mitral valve insufficiency: Initially underwent successful transcatheter mitral edge to edge repair with one XTW clip positioned on A2/P2 reducing severe degenerative nonrheumatic mitral regurgitation to mild to moderate mitral regurgitation and has since developed eccentric mitral regurgitation with a stable MitraClip device in the A2/P2 position on repeat TEE. Seen by Dr. Excell Seltzerooper with plans for repeat MitraClip. He is now successful MitraClip placement 05/29/22 using single MitraClip NT device positioned A1/P1, reducing mitral regurgitation from 4+ at baseline to trace in the post procedure setting. Echocardiogram performed POD with XTW device 05/09/21 (A2P2) followed by NT mitraclip 05/29/22 (A1P1); MR trivial compared to previous; MV mean gradient 3 mmHg with LV EF at 60-65%. Doing great with NYHA class I symptoms. Continue Eliquis 5mg  BID and lifelong dental SBE. Plan follow up in one month with echocardiogram.   Persistent atrial  fibrillation: Restarted on Eliquis. Rates controlled. Continue current regimen.    Acute on Chronic Diastolic CHF: BNP elevated on preop testing. Appears euvolemic on exam. Continue Lasix 20mg  QD. No changes today.    DM2: Resumed on home medications after discharge.   Medication Adjustments/Labs and Tests Ordered: Current medicines are reviewed at length with the patient today.  Concerns regarding medicines are outlined above.  No orders of the defined types were placed in this encounter.  No orders of the defined types were placed in this encounter.   Patient Instructions  Medication Instructions:  Your physician recommends that you continue on your current medications as directed. Please refer to the Current Medication list given to you today.  *If you need a refill on your cardiac medications before your next appointment, please call your pharmacy*   Lab Work: none If you have labs (blood work) drawn today and your tests are completely normal, you will receive your results only by: MyChart Message (if you have MyChart) OR A paper copy in the mail If you have any lab test that is abnormal or we need to change your treatment, we will call you to review the results.   Testing/Procedures: Your physician has requested that you have an echocardiogram. Echocardiography is a painless test that uses sound waves to create images of your heart. It provides your doctor with information about the size and shape of your heart and how well your heart's chambers and valves are working. This procedure takes approximately one hour. There are no restrictions for this procedure. Please do NOT wear cologne, perfume, aftershave, or lotions (deodorant is allowed). Please arrive 15 minutes prior to your appointment time. Scheduled for 07/02/22   Follow-Up: At [facility], you and your health needs are our priority.  As part of our continuing mission to provide you with exceptional heart  care, we have created designated Provider Care Teams.  These Care Teams include your primary Cardiologist (physician)  and Advanced Practice Providers (APPs -  Physician Assistants and Nurse Practitioners) who all work together to provide you with the care you need, when you need it.  We recommend signing up for the patient portal called "MyChart".  Sign up information is provided on this After Visit Summary.  MyChart is used to connect with patients for Virtual Visits (Telemedicine).  Patients are able to view lab/test results, encounter notes, upcoming appointments, etc.  Non-urgent messages can be sent to your provider as well.   To learn more about what you can do with MyChart, go to [email or web address].    Your next appointment:   July 02, 2022  The format for your next appointment:   In Person  Provider:   Georgie Chard, NP    Other Instructions    Important Information About Sugar         Signed, Georgie Chard, NP  06/11/2022 9:41 AM    [facility]

## 2022-06-11 ENCOUNTER — Ambulatory Visit: Payer: Medicare Other | Attending: Interventional Cardiology | Admitting: Cardiology

## 2022-06-11 VITALS — BP 122/60 | HR 60 | Ht 67.0 in | Wt 152.0 lb

## 2022-06-11 DIAGNOSIS — I2510 Atherosclerotic heart disease of native coronary artery without angina pectoris: Secondary | ICD-10-CM | POA: Diagnosis not present

## 2022-06-11 DIAGNOSIS — I340 Nonrheumatic mitral (valve) insufficiency: Secondary | ICD-10-CM

## 2022-06-11 DIAGNOSIS — I4819 Other persistent atrial fibrillation: Secondary | ICD-10-CM | POA: Diagnosis not present

## 2022-06-11 DIAGNOSIS — I10 Essential (primary) hypertension: Secondary | ICD-10-CM

## 2022-06-11 DIAGNOSIS — Z9889 Other specified postprocedural states: Secondary | ICD-10-CM

## 2022-06-11 DIAGNOSIS — Z95818 Presence of other cardiac implants and grafts: Secondary | ICD-10-CM

## 2022-06-11 DIAGNOSIS — E785 Hyperlipidemia, unspecified: Secondary | ICD-10-CM

## 2022-06-11 NOTE — Patient Instructions (Signed)
Medication Instructions:  Your physician recommends that you continue on your current medications as directed. Please refer to the Current Medication list given to you today.  *If you need a refill on your cardiac medications before your next appointment, please call your pharmacy*   Lab Work: none If you have labs (blood work) drawn today and your tests are completely normal, you will receive your results only by: MyChart Message (if you have MyChart) OR A paper copy in the mail If you have any lab test that is abnormal or we need to change your treatment, we will call you to review the results.   Testing/Procedures: Your physician has requested that you have an echocardiogram. Echocardiography is a painless test that uses sound waves to create images of your heart. It provides your doctor with information about the size and shape of your heart and how well your heart's chambers and valves are working. This procedure takes approximately one hour. There are no restrictions for this procedure. Please do NOT wear cologne, perfume, aftershave, or lotions (deodorant is allowed). Please arrive 15 minutes prior to your appointment time. Scheduled for 07/02/22   Follow-Up: At [facility]: [facility], you and your health needs are our priority.  As part of our continuing mission to provide you with exceptional heart care, we have created designated Provider Care Teams.  These Care Teams include your primary Cardiologist (physician) and Advanced Practice Providers (APPs -  Physician Assistants and Nurse Practitioners) who all work together to provide you with the care you need, when you need it.  We recommend signing up for the patient portal called "MyChart".  Sign up information is provided on this After Visit Summary.  MyChart is used to connect with patients for Virtual Visits (Telemedicine).  Patients are able to view lab/test results, encounter notes, upcoming appointments, etc.  Non-urgent  messages can be sent to your provider as well.   To learn more about what you can do with MyChart, go to [email or web address].    Your next appointment:   July 02, 2022  The format for your next appointment:   In Person  Provider:   Georgie Chard, NP    Other Instructions    Important Information About Sugar

## 2022-06-21 ENCOUNTER — Other Ambulatory Visit: Payer: Self-pay | Admitting: Interventional Cardiology

## 2022-06-23 NOTE — Telephone Encounter (Signed)
Prescription refill request for Eliquis received. Indication:afib Last office visit:11/23 Scr:1.2 Age: 81 Weight:68.9  kg  Prescription refilled

## 2022-07-01 NOTE — Progress Notes (Signed)
[facility]   MULTIDISCIPLINARY HEART VALVE CLINIC                                     Cardiology Office Note:    Date:  07/04/2022   ID:  Micheal [name], DOB 05/04/1941, MRN [id number]  PCP:  Lorenda Ishihara, MD  [facility] Cardiologist:  Lesleigh Noe, MD / Dr. Excell Seltzer, MD & Dr. Lynnette Caffey, MD (TEER) [facility] Electrophysiologist:  None   Referring MD: Lorenda Ishihara,*   Chief Complaint  Patient presents with   Follow-up   History of Present Illness:    Micheal [name] is a 81 y.o. male with a hx of CAD s/p CABG 2007, DM2, persistent atrial fibrillation on Eliquis, chronic CHF, PAD, and severe mitral regurgitation s/p TEER 04/2021 who developed worsening SOB and fatigue over the last several months found to have severe residual MR due to persistent bileaflet prolapse. He presented to [facility] for repeat Mitral valve edge to edge repair 05/29/22 and is now being seen for one month follow up.    Micheal [name] was initially referred to Dr. Excell Seltzer 03/05/21 for consideration of treatment options regarding severe mitral regurgitation. He had recently been diagnosed with atrial fibrillation 02/24/21 when he presented with dyspnea, fatigue, and clinical signs of congestive heart failure. He underwent TEE guided cardioversion 02/26/21 and was found to have moderate/severe mitral regurgitation with bileaflet prolapse. He subsequently underwent right and left cardiac catheterization 04/03/2021 which demonstrated occlusion of the SBG>OM with severe disease in the native OM, however this was noted to be a very small vessel with recommendations for continued medical treatment. The SVG to PDA and PLA and the LIMA to LAD graft were both patent. There was 32 mmHg V waves in the wedge position and with moderate pulmonary hypertension, consistent with hemodynamically important mitral regurgitation.   He was seen by Dr. Excell Seltzer 04/08/21 with essentially unchanged symptoms of exertional  dyspnea and fatigue. He was felt to be a good candidate for transcatheter edge-to-edge mitral repair although he was referred for formal surgical consultation as part of a multidisciplinary team approach to his care. He saw Dr. Cliffton Asters 04/26/21 and was felt to be high risk for re-do sternotomy therefore felt better served with MitraClip placement.    He ultimately underwent successful transcatheter mitral edge to edge repair with one XTW clip positioned on A2/P2 reducing severe degenerative nonrheumatic mitral regurgitation to mild to moderate mitral regurgitation. Post clip recommendations were to restart home anticoagulation at prior dose.    He did well for some time after clip placement however was seen by Dr. Katrinka Blazing for regular follow up with exertional fatigue and SOB on 05/05/22. Repeat echocardiogram at that time showed normal LV function with worsening of his mitral regurgitation. A TEE was recommended because of his progressive symptoms and change on surface echo.  This confirmed severe eccentric mitral regurgitation with a stable MitraClip device in the A2/P2 position, but the residual MR arises lateral to the clip and an area of prolapse. He was seen back with Dr. Excell Seltzer 05/23/22 with plans to proceed with additional Mitraclip placement scheduled for 05/28/22.    Micheal [name] continues to do well s/p TEER (second procedure) with no SOB, LE edema, palpitations, orthopnea, dizziness, fatigue, or syncope. He continues to play golf several times per week with no issues. He is tolerating medications with no adverse reactions. One  month echocardiogram with   He did well in the post procedure setting and is here today for one month follow up. He continues to do well with no specific complaints today. He continues to play golf almost daily with no issues such as SOB or fatigue. He denies chest pain, palpitations, LE edema, orthopnea, dizziness, or syncope.    Past Medical History:  Diagnosis Date   CAD  (coronary artery disease)    with Cabg 2008   Claudication (HCC)    Diabetes (HCC)    HTN (hypertension)    Hypercholesteremia    Hyperlipidemia    S/P mitral valve clip implantation 05/09/2021   one XTW clip positioned on A2/P2 per Dr. Jorge Ny   Past Surgical History:  Procedure Laterality Date   ABDOMINAL AORTOGRAM W/LOWER EXTREMITY N/A 09/22/2018   Procedure: ABDOMINAL AORTOGRAM W/LOWER EXTREMITY;  Surgeon: Iran Ouch, MD;  Location: [facility] INVASIVE CV LAB;  Service: Cardiovascular;  Laterality: N/A;   CARDIAC CATHETERIZATION  04/03/2021   CARDIOVERSION N/A 02/26/2021   Procedure: CARDIOVERSION;  Surgeon: Elease Hashimoto Deloris Ping, MD;  Location: [facility] ENDOSCOPY;  Service: Cardiovascular;  Laterality: N/A;   CORONARY ARTERY BYPASS GRAFT  2008   Coronary bypass surgery     2008   EYE SURGERY Bilateral    laser surgery by Dr. Jerolyn Center   MITRAL VALVE REPAIR N/A 05/09/2021   Procedure: MITRAL VALVE REPAIR;  Surgeon: Orbie Pyo, MD;  Location: [facility] INVASIVE CV LAB;  Service: Cardiovascular;  Laterality: N/A;   MITRAL VALVE REPAIR N/A 05/29/2022   Procedure: MITRAL VALVE REPAIR;  Surgeon: Tonny Bollman, MD;  Location: [facility] INVASIVE CV LAB;  Service: Cardiovascular;  Laterality: N/A;   RIGHT/LEFT HEART CATH AND CORONARY/GRAFT ANGIOGRAPHY N/A 04/03/2021   Procedure: RIGHT/LEFT HEART CATH AND CORONARY/GRAFT ANGIOGRAPHY;  Surgeon: Lyn Records, MD;  Location: [facility] INVASIVE CV LAB;  Service: Cardiovascular;  Laterality: N/A;   TEE WITHOUT CARDIOVERSION N/A 02/26/2021   Procedure: TRANSESOPHAGEAL ECHOCARDIOGRAM (TEE);  Surgeon: Elease Hashimoto Deloris Ping, MD;  Location: [facility] ENDOSCOPY;  Service: Cardiovascular;  Laterality: N/A;   TEE WITHOUT CARDIOVERSION N/A 05/09/2021   Procedure: TRANSESOPHAGEAL ECHOCARDIOGRAM (TEE);  Surgeon: Orbie Pyo, MD;  Location: [facility] INVASIVE CV LAB;  Service: Cardiovascular;  Laterality: N/A;   TEE WITHOUT CARDIOVERSION N/A 05/22/2022   Procedure: TRANSESOPHAGEAL  ECHOCARDIOGRAM (TEE);  Surgeon: Sande Rives, MD;  Location: [facility] ENDOSCOPY;  Service: Cardiovascular;  Laterality: N/A;   TEE WITHOUT CARDIOVERSION N/A 05/29/2022   Procedure: TRANSESOPHAGEAL ECHOCARDIOGRAM (TEE);  Surgeon: Tonny Bollman, MD;  Location: [facility] INVASIVE CV LAB;  Service: Cardiovascular;  Laterality: N/A;   Current Medications: Current Meds  Medication Sig   amoxicillin (AMOXIL) 500 MG tablet Take 4 tablets (2,000 mg) one hour prior to all dental visits.   Cholecalciferol (VITAMIN D) 50 MCG (2000 UT) tablet Take 2,000 Units by mouth in the morning and at bedtime.   CINNAMON PO Take 1,000 mg by mouth daily.   Coenzyme Q10 (CO Q-10) 200 MG CAPS Take 200 mg by mouth daily.    dorzolamide-timolol (COSOPT) 22.3-6.8 MG/ML ophthalmic solution Place 1 drop into both eyes 2 (two) times daily.   ELIQUIS 5 MG TABS tablet TAKE 1 TABLET BY MOUTH TWICE A DAY   furosemide (LASIX) 20 MG tablet TAKE 1 TABLET BY MOUTH EVERY DAY   Glucosamine-Chondroitin (GLUCOSAMINE CHONDR COMPLEX PO) Take 1 tablet by mouth daily.   isosorbide mononitrate (IMDUR) 30 MG 24 hr tablet TAKE 1 TABLET BY MOUTH EVERY DAY   KLOR-CON  M20 20 MEQ tablet TAKE 1 TABLET BY MOUTH EVERY DAY   levOCARNitine (L-CARNITINE) 500 MG TABS Take 500 mg by mouth in the morning.   losartan-hydrochlorothiazide (HYZAAR) 50-12.5 MG tablet TAKE 1 TABLET BY MOUTH EVERY DAY   metFORMIN (GLUCOPHAGE) 1000 MG tablet Take 1,000 mg by mouth at bedtime.    metoprolol succinate (TOPROL XL) 25 MG 24 hr tablet Take 1 tablet (25 mg total) by mouth 3 (three) times a week. Take on Mondays, Wednesdays, and Fridays.   Misc Natural Products (NF FORMULAS TESTOSTERONE) CAPS Take 1 capsule by mouth in the morning.   Multiple Vitamins-Minerals (PRESERVISION AREDS 2 PO) Take 1 capsule by mouth 2 (two) times daily.   Omega-3 Fatty Acids (FISH OIL PO) Take 1 capsule by mouth daily.   pravastatin (PRAVACHOL) 20 MG tablet Take 20 mg by mouth at bedtime.    Propylene Glycol (SYSTANE BALANCE) 0.6 % SOLN Place 1 drop into both eyes daily as needed (dry eyes).   QUERCETIN PO Take 1 tablet by mouth daily.   vitamin B-12 (CYANOCOBALAMIN) 1000 MCG tablet Take 1,000 mcg by mouth daily.   vitamin E 400 UNIT capsule Take 400 Units by mouth daily.   VYZULTA 0.024 % SOLN Place 1 drop into the right eye at bedtime.     Allergies:   Patient has no known allergies.   Social History   Socioeconomic History   Marital status: Widowed    Spouse name: Not on file   Number of children: Not on file   Years of education: Not on file   Highest education level: Not on file  Occupational History   Not on file  Tobacco Use   Smoking status: Former   Smokeless tobacco: Never  Vaping Use   Vaping Use: Never used  Substance and Sexual Activity   Alcohol use: Not Currently    Alcohol/week: 1.0 standard drink of alcohol    Types: 1 Glasses of wine per week   Drug use: No   Sexual activity: Yes  Other Topics Concern   Not on file  Social History Narrative   Not on file   Social Determinants of Health   Financial Resource Strain: Not on file  Food Insecurity: Not on file  Transportation Needs: Not on file  Physical Activity: Not on file  Stress: Not on file  Social Connections: Not on file     Family History: The patient's family history includes Healthy in his sister, sister, and sister; Heart attack in his maternal grandfather; Heart disease in his father.  ROS:   Please see the history of present illness.    All other systems reviewed and are negative.  EKGs/Labs/Other Studies Reviewed:    The following studies were reviewed today:  TEER 05/29/22:   Successful transcatheter edge-to-edge repair of the mitral valve using a single MitraClip NT device positioned A1/P1, reducing mitral regurgitation from 4+ at baseline to trace post procedure.      Echocardiogram 05/30/22:   S/p mitraclip with XTW device 05/09/21 (A2P2) followed by NT  mitraclip 05/29/22 (A1P1); MR now trivial compared to previous; MV mean gradient 3 mmHg with LV EF at 60-65%    Echocardiogram 07/02/22:   1. S/p mitraclip x 2 (XTW in A2-P2 position, NT in A1-P1 position).  Medial orifice with MG 3.7 mmHG @ 61 bpm. Trivial residual MR. The mitral  valve has been repaired/replaced. Trivial mitral valve regurgitation. No  evidence of mitral stenosis. The mean  mitral valve gradient is 3.7  mmHg with average heart rate of 61 bpm. There  is a Mitra-Clip present in the mitral position. Procedure Date: 05/29/2022.   2. Left ventricular ejection fraction, by estimation, is 60 to 65%. The  left ventricle has normal function. The left ventricle has no regional  wall motion abnormalities. Left ventricular diastolic function could not  be evaluated.   3. Right ventricular systolic function is moderately reduced. The right  ventricular size is moderately enlarged. There is moderately elevated  pulmonary artery systolic pressure. The estimated right ventricular  systolic pressure is 56.9 mmHg.   4. Left atrial size was severely dilated.   5. Right atrial size was severely dilated.   6. Tricuspid valve regurgitation is mild to moderate.   7. The aortic valve is tricuspid. Aortic valve regurgitation is not  visualized. No aortic stenosis is present.   8. The inferior vena cava is normal in size with greater than 50%  respiratory variability, suggesting right atrial pressure of 3 mmHg.   9. Evidence of atrial level shunting detected by color flow Doppler.  There is a small patent foramen ovale with predominantly right to left  shunting across the atrial septum.   EKG:  EKG is not ordered today.    Recent Labs: 11/07/2021: NT-Pro BNP 4,141 05/26/2022: ALT 20; B Natriuretic Peptide 771.5 05/30/2022: BUN 22; Creatinine, Ser 1.27; Hemoglobin 12.5; Platelets 160; Potassium 4.6; Sodium 139   Recent Lipid Panel    Component Value Date/Time   CHOL 130 02/25/2021 0249    CHOL 86 (L) 10/10/2019 0744   TRIG 102 02/25/2021 0249   HDL 35 (L) 02/25/2021 0249   HDL 44 10/10/2019 0744   CHOLHDL 3.7 02/25/2021 0249   VLDL 20 02/25/2021 0249   LDLCALC 75 02/25/2021 0249   LDLCALC 24 10/10/2019 0744   Physical Exam:    VS:  BP 130/60 (BP Location: Left Arm, Patient Position: Sitting, Cuff Size: Normal)   Pulse (!) 45   Ht  (1.702 m)   Wt 149 lb (67.6 kg)   SpO2 93%   BMI 23.34 kg/m     Wt Readings from Last 3 Encounters:  07/02/22 149 lb (67.6 kg)  06/11/22 152 lb (68.9 kg)  05/29/22 150 lb 12.8 oz (68.4 kg)    General: Well developed, well nourished, NAD Lungs:Clear to ausculation bilaterally. No wheezes, rales, or rhonchi. Breathing is unlabored. Cardiovascular: RRR with S1 S2. Soft residual murmurs Extremities: No edema.  Neuro: Alert and oriented. No focal deficits. No facial asymmetry. MAE spontaneously. Psych: Responds to questions appropriately with normal affect.    ASSESSMENT/PLAN:    Severe non-rheumatic mitral valve insufficiency: Doing well with NYHA class I symptoms s/p second MitraClip procedure with a single MitraClip NT device positioned A1/P1, reducing mitral regurgitation from 4+ at baseline to trace in the post procedure setting. Echocardiogram performed POD with XTW device 05/09/21 (A2P2) followed by NT mitraclip 05/29/22 (A1P1); MR trivial compared to previous; MV mean gradient 3 mmHg with LV EF at 60-65%. Echocardiogram today with mitraclip x 2 (XTW in A2-P2 position, NT in A1-P1 position).  Medial orifice with MG 3.7 mmHG @ 61 bpm, and trivial residual MichealContinue Eliquis  BID and lifelong dental SBE. Plan follow up in one year follow up with echocardiogram.    Persistent atrial fibrillation: Restarted on Eliquis. Rates controlled. Continue current regimen.    Acute on Chronic Diastolic CHF: Appears euvolemic on exam. Continue Lasix  QD. No changes today.    DM2: Continue current regimen.  Medication Adjustments/Labs  and Tests Ordered: Current medicines are reviewed at length with the patient today.  Concerns regarding medicines are outlined above.  No orders of the defined types were placed in this encounter.  No orders of the defined types were placed in this encounter.   Patient Instructions  Medication Instructions:  Your physician recommends that you continue on your current medications as directed. Please refer to the Current Medication list given to you today.  *If you need a refill on your cardiac medications before your next appointment, please call your pharmacy*   Lab Work: NONE If you have labs (blood work) drawn today and your tests are completely normal, you will receive your results only by: MyChart Message (if you have MyChart) OR A paper copy in the mail If you have any lab test that is abnormal or we need to change your treatment, we will call you to review the results.   Testing/Procedures: NONE   Follow-Up: At [facility], you and your health needs are our priority.  As part of our continuing mission to provide you with exceptional heart care, we have created designated Provider Care Teams.  These Care Teams include your primary Cardiologist (physician) and Advanced Practice Providers (APPs -  Physician Assistants and Nurse Practitioners) who all work together to provide you with the care you need, when you need it.  We recommend signing up for the patient portal called "MyChart".  Sign up information is provided on this After Visit Summary.  MyChart is used to connect with patients for Virtual Visits (Telemedicine).  Patients are able to view lab/test results, encounter notes, upcoming appointments, etc.  Non-urgent messages can be sent to your provider as well.   To learn more about what you can do with MyChart, go to [email or web address].    Your next appointment:   09/16/21 @ 9:00am  The format for your next appointment:   In Person  Provider:   Tonny Bollman, MD    Important Information About Sugar         Signed, Georgie Chard, NP  07/04/2022 1:10 PM    [facility]

## 2022-07-02 ENCOUNTER — Ambulatory Visit (HOSPITAL_BASED_OUTPATIENT_CLINIC_OR_DEPARTMENT_OTHER): Payer: Medicare Other

## 2022-07-02 ENCOUNTER — Ambulatory Visit (HOSPITAL_COMMUNITY): Payer: Medicare Other | Attending: Cardiology | Admitting: Cardiology

## 2022-07-02 VITALS — BP 130/60 | HR 45 | Ht 67.0 in | Wt 149.0 lb

## 2022-07-02 DIAGNOSIS — I340 Nonrheumatic mitral (valve) insufficiency: Secondary | ICD-10-CM | POA: Diagnosis not present

## 2022-07-02 DIAGNOSIS — I10 Essential (primary) hypertension: Secondary | ICD-10-CM | POA: Diagnosis not present

## 2022-07-02 DIAGNOSIS — Z95818 Presence of other cardiac implants and grafts: Secondary | ICD-10-CM | POA: Diagnosis not present

## 2022-07-02 DIAGNOSIS — E0800 Diabetes mellitus due to underlying condition with hyperosmolarity without nonketotic hyperglycemic-hyperosmolar coma (NKHHC): Secondary | ICD-10-CM | POA: Insufficient documentation

## 2022-07-02 DIAGNOSIS — I5032 Chronic diastolic (congestive) heart failure: Secondary | ICD-10-CM | POA: Diagnosis not present

## 2022-07-02 DIAGNOSIS — Z9889 Other specified postprocedural states: Secondary | ICD-10-CM | POA: Diagnosis not present

## 2022-07-02 DIAGNOSIS — I2510 Atherosclerotic heart disease of native coronary artery without angina pectoris: Secondary | ICD-10-CM | POA: Diagnosis not present

## 2022-07-02 DIAGNOSIS — E785 Hyperlipidemia, unspecified: Secondary | ICD-10-CM | POA: Insufficient documentation

## 2022-07-02 LAB — ECHOCARDIOGRAM COMPLETE
Area-P 1/2: 2.56 cm2
MV VTI: 0.72 cm2
S' Lateral: 3.2 cm

## 2022-07-02 NOTE — Patient Instructions (Signed)
Medication Instructions:  Your physician recommends that you continue on your current medications as directed. Please refer to the Current Medication list given to you today.  *If you need a refill on your cardiac medications before your next appointment, please call your pharmacy*   Lab Work: NONE If you have labs (blood work) drawn today and your tests are completely normal, you will receive your results only by: MyChart Message (if you have MyChart) OR A paper copy in the mail If you have any lab test that is abnormal or we need to change your treatment, we will call you to review the results.   Testing/Procedures: NONE   Follow-Up: At [facility], you and your health needs are our priority.  As part of our continuing mission to provide you with exceptional heart care, we have created designated Provider Care Teams.  These Care Teams include your primary Cardiologist (physician) and Advanced Practice Providers (APPs -  Physician Assistants and Nurse Practitioners) who all work together to provide you with the care you need, when you need it.  We recommend signing up for the patient portal called "MyChart".  Sign up information is provided on this After Visit Summary.  MyChart is used to connect with patients for Virtual Visits (Telemedicine).  Patients are able to view lab/test results, encounter notes, upcoming appointments, etc.  Non-urgent messages can be sent to your provider as well.   To learn more about what you can do with MyChart, go to [email or web address].    Your next appointment:   09/16/21 @ 9:00am  The format for your next appointment:   In Person  Provider:   Tonny Bollman, MD    Important Information About Sugar

## 2022-07-06 ENCOUNTER — Other Ambulatory Visit: Payer: Self-pay | Admitting: Interventional Cardiology

## 2022-07-15 ENCOUNTER — Encounter (INDEPENDENT_AMBULATORY_CARE_PROVIDER_SITE_OTHER): Payer: Medicare Other | Admitting: Ophthalmology

## 2022-07-15 DIAGNOSIS — I10 Essential (primary) hypertension: Secondary | ICD-10-CM

## 2022-07-15 DIAGNOSIS — H35033 Hypertensive retinopathy, bilateral: Secondary | ICD-10-CM | POA: Diagnosis not present

## 2022-07-15 DIAGNOSIS — H353231 Exudative age-related macular degeneration, bilateral, with active choroidal neovascularization: Secondary | ICD-10-CM | POA: Diagnosis not present

## 2022-07-15 DIAGNOSIS — H43813 Vitreous degeneration, bilateral: Secondary | ICD-10-CM | POA: Diagnosis not present

## 2022-07-16 ENCOUNTER — Encounter (INDEPENDENT_AMBULATORY_CARE_PROVIDER_SITE_OTHER): Payer: Medicare Other | Admitting: Ophthalmology

## 2022-07-16 DIAGNOSIS — H353211 Exudative age-related macular degeneration, right eye, with active choroidal neovascularization: Secondary | ICD-10-CM | POA: Diagnosis not present

## 2022-07-19 ENCOUNTER — Other Ambulatory Visit: Payer: Self-pay | Admitting: Interventional Cardiology

## 2022-08-06 IMAGING — CR DG CHEST 2V
2 series · 2 of 2 positions shown · non-contrast
Comparison: Chest x-ray 02/24/2021.

CLINICAL DATA: 80-year-old male under preoperative evaluation prior
to mitral valve surgery.

EXAM:
CHEST - 2 VIEW

[w chest pa]
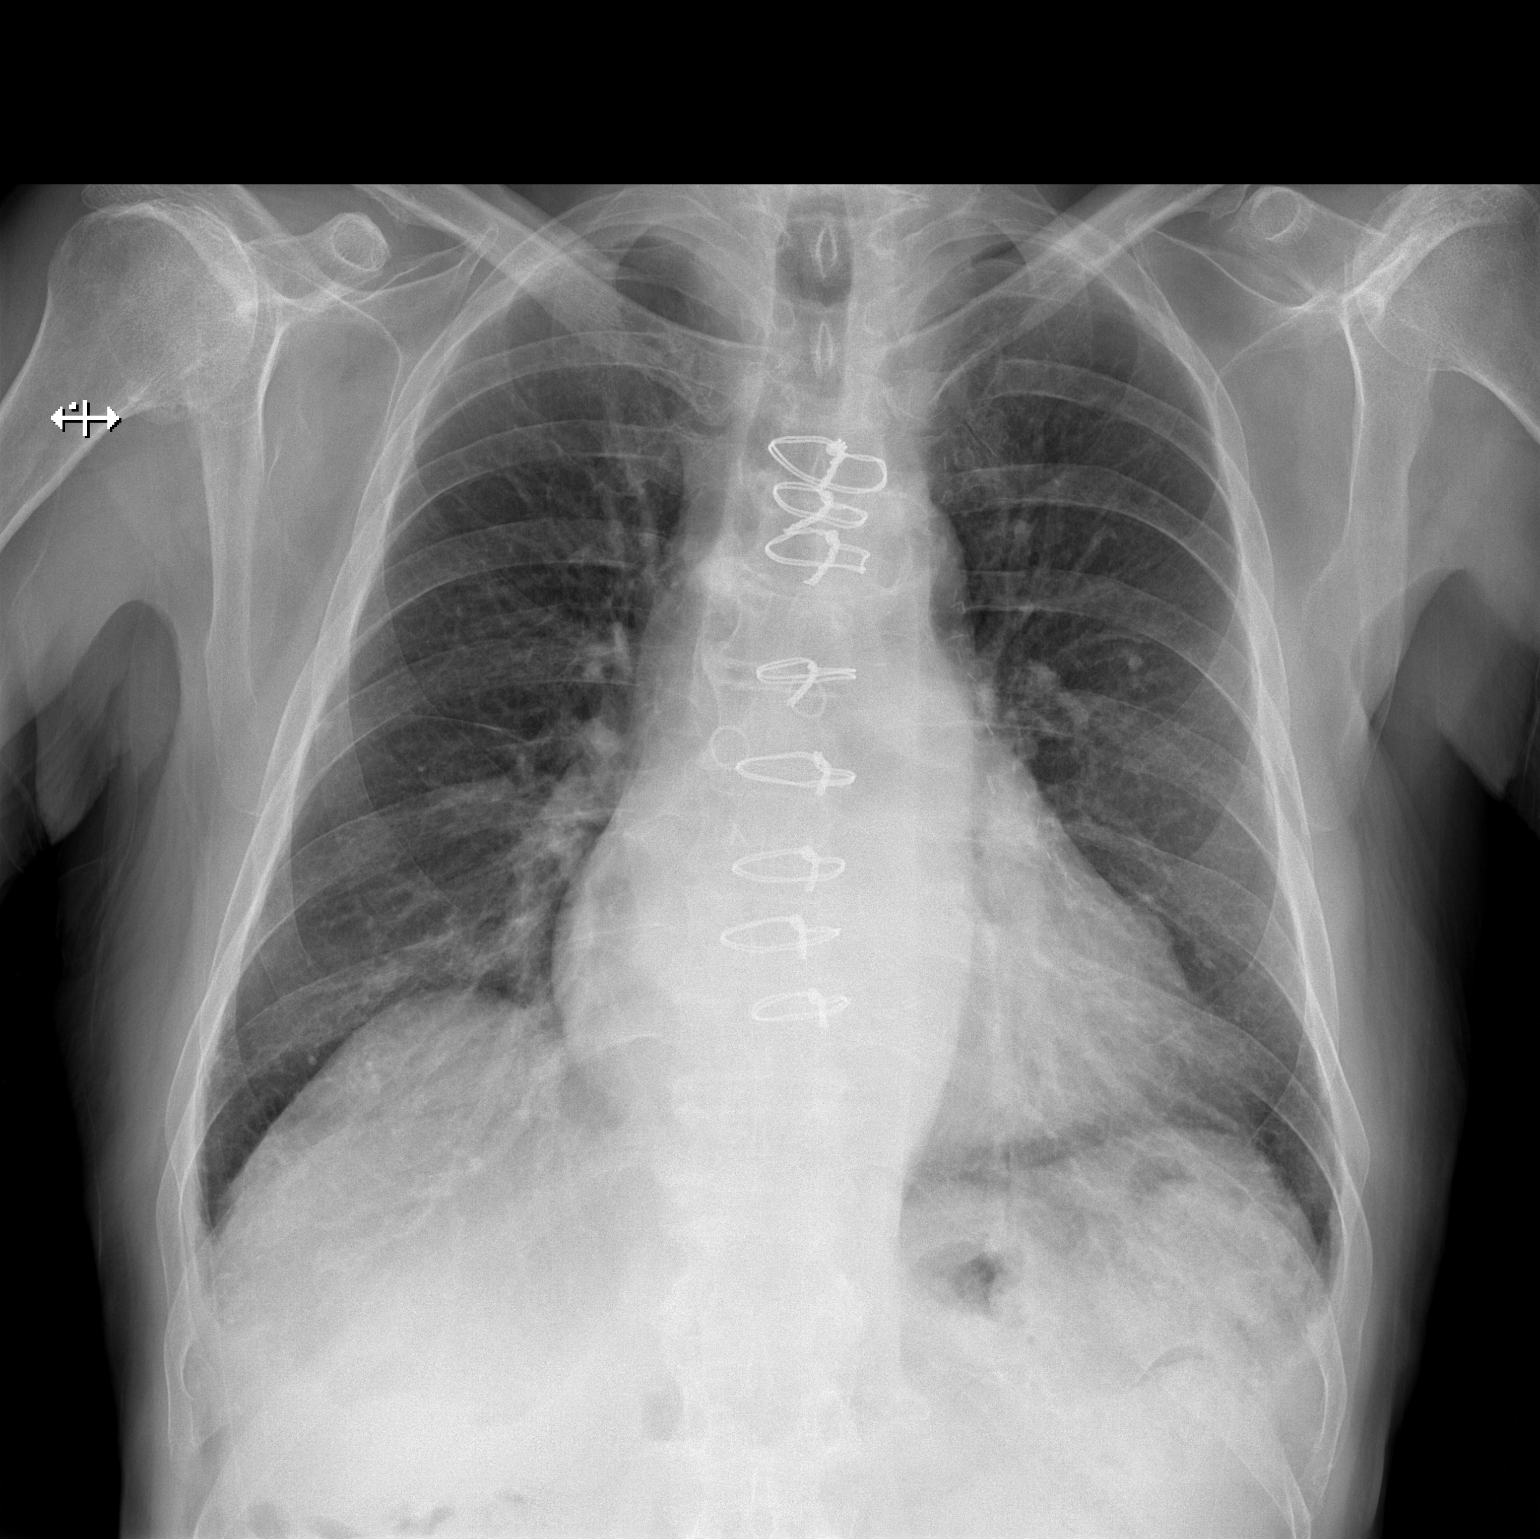

[w chest lat]
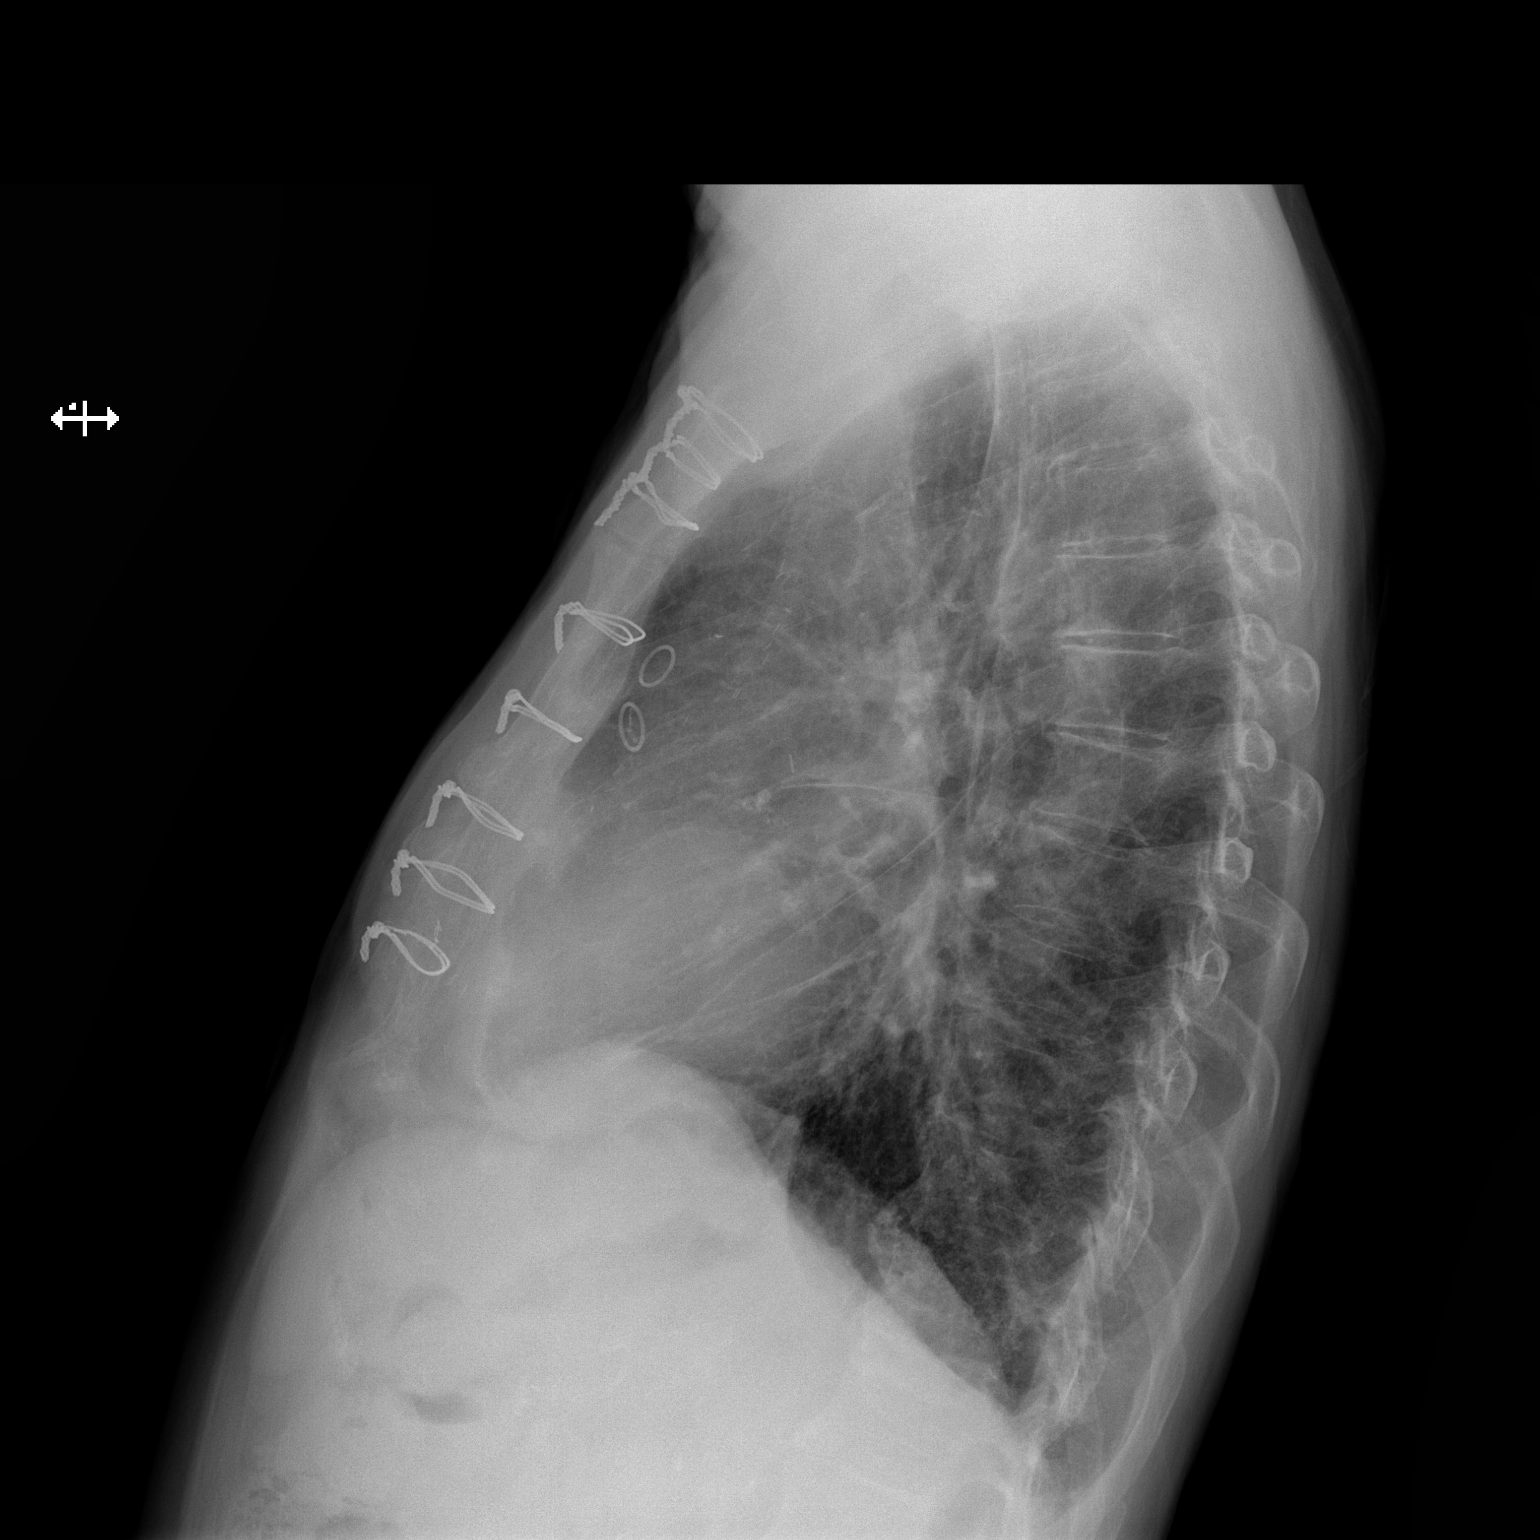

[2 of 2 positions shown; findings below may reference images not displayed]

FINDINGS: Lung volumes are normal. No consolidative airspace disease. No
pleural effusions. No pneumothorax. No pulmonary nodule or mass
noted. Pulmonary vasculature and the cardiomediastinal silhouette
are within normal limits. Atherosclerosis in the thoracic aorta.
Status post median sternotomy for CABG.
IMPRESSION: 1.  No radiographic evidence of acute cardiopulmonary disease.
2. Aortic atherosclerosis.

## 2022-08-12 ENCOUNTER — Ambulatory Visit: Payer: Medicare Other | Attending: Cardiovascular Disease | Admitting: Cardiovascular Disease

## 2022-08-12 ENCOUNTER — Encounter: Payer: Self-pay | Admitting: Cardiovascular Disease

## 2022-08-12 VITALS — BP 130/70 | HR 66 | Ht 67.5 in | Wt 139.2 lb

## 2022-08-12 DIAGNOSIS — I739 Peripheral vascular disease, unspecified: Secondary | ICD-10-CM | POA: Diagnosis not present

## 2022-08-12 DIAGNOSIS — I4819 Other persistent atrial fibrillation: Secondary | ICD-10-CM

## 2022-08-12 DIAGNOSIS — Z9889 Other specified postprocedural states: Secondary | ICD-10-CM | POA: Diagnosis not present

## 2022-08-12 DIAGNOSIS — E785 Hyperlipidemia, unspecified: Secondary | ICD-10-CM

## 2022-08-12 DIAGNOSIS — I25118 Atherosclerotic heart disease of native coronary artery with other forms of angina pectoris: Secondary | ICD-10-CM

## 2022-08-12 DIAGNOSIS — I10 Essential (primary) hypertension: Secondary | ICD-10-CM

## 2022-08-12 DIAGNOSIS — Z95818 Presence of other cardiac implants and grafts: Secondary | ICD-10-CM

## 2022-08-12 NOTE — Progress Notes (Signed)
Cardiology Office Note   Date:  08/12/2022   ID:  Micheal [name], DOB 1941-06-09, MRN [id number]  PCP:  Lorenda Ishihara, MD  Cardiologist:  Dr. Katrinka Blazing   No chief complaint on file.     History of Present Illness: Micheal [name] is a 82 y.o. male who is here today for follow-up visit regarding peripheral arterial disease.   Patient has known history of coronary artery disease status post CABG in 2008, type 2 diabetes, hypertension and hyperlipidemia.  He is a previous smoker and quit in 2008.  He has no family history of coronary artery disease. 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 He was seen in 2020 for bilateral leg pain that starts in the thigh area and goes down.  He underwent lower extremity arterial Doppler which showed an ABI of 1.07 on the right and 0.84 on the left.  Given significant limitations with symptoms, I proceeded with angiography and February 2020  which showed no significant aortoiliac disease.  On the right side, there was mild SFA disease with two-vessel runoff below the knee.  On the left side, there was mild SFA disease with one-vessel runoff below the knee via the peroneal artery.  These findings could not explain his symptoms especially that he does not complain of calf or foot claudication. He was diagnosed with atrial fibrillation in July 2022 with subsequent cardioversion.  He was found to have severe mitral regurgitation and subsequently underwent mitral valve clip in October, 2022.  He had recurrent mitral regurgitation that required a second clip in October.  He has been doing well since then and continues to be very active.  He plays golf 3 times a week.  He reports stable bilateral leg claudication.   Past Medical History:  Diagnosis Date   CAD (coronary artery disease)    with Cabg 2008   Claudication (HCC)    Diabetes (HCC)    HTN (hypertension)    Hypercholesteremia     Hyperlipidemia    S/P mitral valve clip implantation 05/09/2021   one XTW clip positioned on A2/P2 per Dr. Jorge Ny    Past Surgical History:  Procedure Laterality Date   ABDOMINAL AORTOGRAM W/LOWER EXTREMITY N/A 09/22/2018   Procedure: ABDOMINAL AORTOGRAM W/LOWER EXTREMITY;  Surgeon: Iran Ouch, MD;  Location: [facility] INVASIVE [facility] LAB;  Service: Cardiovascular;  Laterality: N/A;   CARDIAC CATHETERIZATION  04/03/2021   CARDIOVERSION N/A 02/26/2021   Procedure: CARDIOVERSION;  Surgeon: Elease Hashimoto Deloris Ping, MD;  Location: [facility] ENDOSCOPY;  Service: Cardiovascular;  Laterality: N/A;   CORONARY ARTERY BYPASS GRAFT  2008   Coronary bypass surgery     2008   EYE SURGERY Bilateral    laser surgery by Dr. Jerolyn Center   MITRAL VALVE REPAIR N/A 05/09/2021   Procedure: MITRAL VALVE REPAIR;  Surgeon: Orbie Pyo, MD;  Location: [facility] INVASIVE CV LAB;  Service: Cardiovascular;  Laterality: N/A;   MITRAL VALVE REPAIR N/A 05/29/2022   Procedure: MITRAL VALVE REPAIR;  Surgeon: Tonny Bollman, MD;  Location[facility] INVASIVE CV LAB;  Service: Cardiovascular;  Laterality: N/A;   RIGHT/LEFT HEART CATH AND CORONARY/GRAFT ANGIOGRAPHY N/A 04/03/2021   Procedure: RIGHT/LEFT HEART CATH AND CORONARY/GRAFT ANGIOGRAPHY;  Surgeon: Lyn Records, MD;  Location: [facility] INVASIVE CV LAB;  Service: Cardiovascular;  Laterality: N/A;   TEE WITHOUT CARDIOVERSION N/A 02/26/2021   Procedure: TRANSESOPHAGEAL ECHOCARDIOGRAM (TEE);  Surgeon: Vesta Mixer, MD;  Location: [facility] ENDOSCOPY;  Service: Cardiovascular;  Laterality: N/A;   TEE WITHOUT CARDIOVERSION N/A 05/09/2021   Procedure: TRANSESOPHAGEAL ECHOCARDIOGRAM (TEE);  Surgeon: [name], MD;  Location: [place] CV LAB;  Service: Cardiovascular;  Laterality: N/A;   TEE WITHOUT CARDIOVERSION N/A 05/22/2022   Procedure: TRANSESOPHAGEAL ECHOCARDIOGRAM (TEE);  Surgeon: [name], MD;  Location: [place];  Service: Cardiovascular;  Laterality: N/A;   TEE  WITHOUT CARDIOVERSION N/A 05/29/2022   Procedure: TRANSESOPHAGEAL ECHOCARDIOGRAM (TEE);  Surgeon: [name], MD;  Location: [place] CV LAB;  Service: Cardiovascular;  Laterality: N/A;     Current Outpatient Medications  Medication Sig Dispense Refill   amoxicillin (AMOXIL) 500 MG tablet Take 4 tablets (2,000 mg) one hour prior to all dental visits. 30 tablet 5   Cholecalciferol (VITAMIN D) 50 MCG (2000 UT) tablet Take 2,000 Units by mouth in the morning and at bedtime.     CINNAMON PO Take 1,000 mg by mouth daily.     Coenzyme Q10 (CO Q-10) 200 MG CAPS Take 200 mg by mouth daily.      dorzolamide-timolol (COSOPT) 22.3-6.8 MG/ML ophthalmic solution Place 1 drop into both eyes 2 (two) times daily.     ELIQUIS 5 MG TABS tablet TAKE 1 TABLET BY MOUTH TWICE A DAY 180 tablet 1   furosemide (LASIX) 20 MG tablet TAKE 1 TABLET BY MOUTH EVERY DAY 90 tablet 1   Glucosamine-Chondroitin (GLUCOSAMINE CHONDR COMPLEX PO) Take 1 tablet by mouth daily.     isosorbide mononitrate (IMDUR) 30 MG 24 hr tablet TAKE 1 TABLET BY MOUTH EVERY DAY 90 tablet 3   levOCARNitine (L-CARNITINE) 500 MG TABS Take 500 mg by mouth in the morning.     losartan-hydrochlorothiazide (HYZAAR) 50-12.5 MG tablet Take 1 tablet by mouth daily. 90 tablet 3   metFORMIN (GLUCOPHAGE) 1000 MG tablet Take 1,000 mg by mouth at bedtime.      metoprolol succinate (TOPROL XL) 25 MG 24 hr tablet Take 1 tablet (25 mg total) by mouth 3 (three) times a week. Take on Mondays, Wednesdays, and Fridays.     Misc Natural Products (NF FORMULAS TESTOSTERONE) CAPS Take 1 capsule by mouth in the morning.     Multiple Vitamins-Minerals (PRESERVISION AREDS 2 PO) Take 1 capsule by mouth 2 (two) times daily.     Omega-3 Fatty Acids (FISH OIL PO) Take 1 capsule by mouth daily.     potassium chloride SA (KLOR-CON M20) 20 MEQ tablet TAKE 1 TABLET BY MOUTH EVERY DAY 90 tablet 3   pravastatin (PRAVACHOL) 20 MG tablet Take 20 mg by mouth at bedtime.      Propylene Glycol (SYSTANE BALANCE) 0.6 % SOLN Place 1 drop into both eyes daily as needed (dry eyes).     QUERCETIN PO Take 1 tablet by mouth daily.     vitamin B-12 (CYANOCOBALAMIN) 1000 MCG tablet Take 1,000 mcg by mouth daily.     vitamin E 400 UNIT capsule Take 400 Units by mouth daily.     VYZULTA 0.024 % SOLN Place 1 drop into the right eye at bedtime.     No current facility-administered medications for this visit.    Allergies:   Patient has no known allergies.    Social History:  The patient  reports that he has quit smoking. He has never used smokeless tobacco. He reports that he does not currently use alcohol after a past usage of about 1.0 standard drink of alcohol per week. He reports that he does  not use drugs.   Family History:  The patient's family history includes Healthy in his sister, sister, and sister; Heart attack in his maternal grandfather; Heart disease in his father.    ROS:  Please see the history of present illness.   Otherwise, review of systems are positive for none.   All other systems are reviewed and negative.    PHYSICAL EXAM: VS:  BP 130/70   Pulse 66   Ht 5' 7.5" (1.715 m)   Wt 139 lb 3.2 oz (63.1 kg)   BMI 21.48 kg/m  , BMI Body mass index is 21.48 kg/m. GEN: Well nourished, well developed, in no acute distress  HEENT: normal  Neck: no JVD, carotid bruits, or masses Cardiac: Irregularly irregular; no rubs, or gallops,no edema . 1/6 holosystolic murmur at the apex. Respiratory:  clear to auscultation bilaterally, normal work of breathing GI: soft, nontender, nondistended, + BS MS: no deformity or atrophy  Skin: warm and dry, no rash Neuro:  Strength and sensation are intact Psych: euthymic mood, full affect Vascular: Femoral pulse: Slightly diminished bilaterally.  Distal pulses are not palpable.   EKG:  EKG  is not ordered today.   Recent Labs: 11/07/2021: NT-Pro BNP 4,141 05/26/2022: ALT 20; B Natriuretic Peptide 771.5 05/30/2022: BUN  22; Creatinine, Ser 1.27; Hemoglobin 12.5; Platelets 160; Potassium 4.6; Sodium 139    Lipid Panel    Component Value Date/Time   CHOL 130 02/25/2021 0249   CHOL 86 (L) 10/10/2019 0744   TRIG 102 02/25/2021 0249   HDL 35 (L) 02/25/2021 0249   HDL 44 10/10/2019 0744   CHOLHDL 3.7 02/25/2021 0249   VLDL 20 02/25/2021 0249   LDLCALC 75 02/25/2021 0249   LDLCALC 24 10/10/2019 0744      Wt Readings from Last 3 Encounters:  08/12/22 139 lb 3.2 oz (63.1 kg)  07/02/22 149 lb (67.6 kg)  06/11/22 152 lb (68.9 kg)          No data to display            ASSESSMENT AND PLAN:  1.  Peripheral arterial disease with atypical leg pain: The patient still reports mild thigh discomfort with walking with no calf claudication.   Walking distance is variable from day-to-day which is atypical for true claudication.  Continue medical therapy for now.  2.  Coronary artery disease involving native coronary arteries without angina: He is doing well overall with no anginal symptoms and continues to be very active.  3.  Hyperlipidemia: Continue treatment with a statin with a target LDL of less than 70.  4.  Essential hypertension: Blood pressure is controlled.  5.  Status post mitral valve clip for severe mitral regurgitation: He reports significant improvement in symptoms after his second procedure in October.   5.  Persistent atrial fibrillation: Continue rate control and anticoagulation.    Disposition:  FU with me in 1 year  Signed,  Kathlyn Sacramento, MD  08/12/2022 10:04 AM    [place]

## 2022-08-12 NOTE — Patient Instructions (Signed)
Medication Instructions:  No changes *If you need a refill on your cardiac medications before your next appointment, please call your pharmacy*   Lab Work: None ordered If you have labs (blood work) drawn today and your tests are completely normal, you will receive your results only by: MyChart Message (if you have MyChart) OR A paper copy in the mail If you have any lab test that is abnormal or we need to change your treatment, we will call you to review the results.   Testing/Procedures: None ordered   Follow-Up: At [facility], you and your health needs are our priority.  As part of our continuing mission to provide you with exceptional heart care, we have created designated Provider Care Teams.  These Care Teams include your primary Cardiologist (physician) and Advanced Practice Providers (APPs -  Physician Assistants and Nurse Practitioners) who all work together to provide you with the care you need, when you need it.  We recommend signing up for the patient portal called "MyChart".  Sign up information is provided on this After Visit Summary.  MyChart is used to connect with patients for Virtual Visits (Telemedicine).  Patients are able to view lab/test results, encounter notes, upcoming appointments, etc.  Non-urgent messages can be sent to your provider as well.   To learn more about what you can do with MyChart, go to [email or web address].    Your next appointment:   12 month(s)  Provider:   Dr. Arida 

## 2022-09-02 ENCOUNTER — Encounter (INDEPENDENT_AMBULATORY_CARE_PROVIDER_SITE_OTHER): Payer: Medicare Other | Admitting: Ophthalmology

## 2022-09-02 DIAGNOSIS — H353231 Exudative age-related macular degeneration, bilateral, with active choroidal neovascularization: Secondary | ICD-10-CM | POA: Diagnosis not present

## 2022-09-02 DIAGNOSIS — H35033 Hypertensive retinopathy, bilateral: Secondary | ICD-10-CM

## 2022-09-02 DIAGNOSIS — H43813 Vitreous degeneration, bilateral: Secondary | ICD-10-CM

## 2022-09-02 DIAGNOSIS — I10 Essential (primary) hypertension: Secondary | ICD-10-CM

## 2022-09-03 ENCOUNTER — Encounter (INDEPENDENT_AMBULATORY_CARE_PROVIDER_SITE_OTHER): Payer: Medicare Other | Admitting: Ophthalmology

## 2022-09-03 DIAGNOSIS — H353211 Exudative age-related macular degeneration, right eye, with active choroidal neovascularization: Secondary | ICD-10-CM | POA: Diagnosis not present

## 2022-09-04 DIAGNOSIS — Z23 Encounter for immunization: Secondary | ICD-10-CM | POA: Diagnosis not present

## 2022-09-04 DIAGNOSIS — I25119 Atherosclerotic heart disease of native coronary artery with unspecified angina pectoris: Secondary | ICD-10-CM | POA: Diagnosis not present

## 2022-09-04 DIAGNOSIS — I5032 Chronic diastolic (congestive) heart failure: Secondary | ICD-10-CM | POA: Diagnosis not present

## 2022-09-04 DIAGNOSIS — I4891 Unspecified atrial fibrillation: Secondary | ICD-10-CM | POA: Diagnosis not present

## 2022-09-04 DIAGNOSIS — E1169 Type 2 diabetes mellitus with other specified complication: Secondary | ICD-10-CM | POA: Diagnosis not present

## 2022-09-04 DIAGNOSIS — I700 Atherosclerosis of aorta: Secondary | ICD-10-CM | POA: Diagnosis not present

## 2022-09-04 DIAGNOSIS — Z952 Presence of prosthetic heart valve: Secondary | ICD-10-CM | POA: Diagnosis not present

## 2022-09-16 ENCOUNTER — Encounter: Payer: Self-pay | Admitting: Cardiovascular Disease

## 2022-09-16 ENCOUNTER — Ambulatory Visit: Payer: Medicare Other | Attending: Cardiovascular Disease | Admitting: Cardiovascular Disease

## 2022-09-16 VITALS — BP 114/60 | HR 61 | Ht 66.0 in | Wt 148.2 lb

## 2022-09-16 DIAGNOSIS — I25118 Atherosclerotic heart disease of native coronary artery with other forms of angina pectoris: Secondary | ICD-10-CM | POA: Diagnosis not present

## 2022-09-16 DIAGNOSIS — I4819 Other persistent atrial fibrillation: Secondary | ICD-10-CM

## 2022-09-16 DIAGNOSIS — I340 Nonrheumatic mitral (valve) insufficiency: Secondary | ICD-10-CM | POA: Insufficient documentation

## 2022-09-16 DIAGNOSIS — I5032 Chronic diastolic (congestive) heart failure: Secondary | ICD-10-CM | POA: Diagnosis not present

## 2022-09-16 NOTE — Progress Notes (Signed)
Cardiology Office Note:    Date:  09/16/2022   ID:  Micheal [name], DOB 09-02-1940, MRN [id number]  PCP:  [name], [place] [facility] Cardiologist:  [name], MD     Referring MD: [name],*   Chief Complaint  Patient presents with   Atrial Fibrillation    History of Present Illness:    Micheal [name] is a 82 y.o. male with a hx of  CAD s/p CABG 2007, DM2, persistent atrial fibrillation on Eliquis, chronic CHF, PAD, and severe mitral regurgitation s/p TEER 04/2021 who developed worsening SOB and fatigue the following year and was found to have severe residual MR due to persistent bileaflet prolapse. He presented to [facility] for repeat Mitral valve edge to edge repair 05/29/22, undergoing implant with a MitraClip NT device positioned A1P1, reducing MR from severe to trivial post-procedure.   The patient is here alone today.  He has been doing well and continues to stay active with playing golf regularly.  He is able to play 18 holes without difficulty.  He occasionally has shortness of breath that is unrelated to physical activity.  When this occurs, he takes an extra furosemide and symptoms resolved fairly quickly.  He denies orthopnea, leg edema, PND, chest pain, lightheadedness, or syncope.  He has had no heart palpitations.  Past Medical History:  Diagnosis Date   CAD (coronary artery disease)    with Cabg 2008   Claudication ([place])    Diabetes ([place])    HTN (hypertension)    Hypercholesteremia    Hyperlipidemia    S/P mitral valve clip implantation 05/09/2021   one XTW clip positioned on A2/P2 per Dr. [name]    Past Surgical History:  Procedure Laterality Date   ABDOMINAL AORTOGRAM W/LOWER EXTREMITY N/A 09/22/2018   Procedure: ABDOMINAL AORTOGRAM W/LOWER EXTREMITY;  Surgeon: [name], MD;  Location: [place] CV LAB;  Service: Cardiovascular;  Laterality: N/A;   CARDIAC CATHETERIZATION  04/03/2021    CARDIOVERSION N/A 02/26/2021   Procedure: CARDIOVERSION;  Surgeon: [name], MD;  Location: [facility] ENDOSCOPY;  Service: Cardiovascular;  Laterality: N/A;   CORONARY ARTERY BYPASS GRAFT  2008   Coronary bypass surgery     2008   EYE SURGERY Bilateral    laser surgery by Dr. Rodena Piety   MITRAL VALVE REPAIR N/A 05/09/2021   Procedure: MITRAL VALVE REPAIR;  Surgeon: [name], MD;  Location: [place] CV LAB;  Service: Cardiovascular;  Laterality: N/A;   MITRAL VALVE REPAIR N/A 05/29/2022   Procedure: MITRAL VALVE REPAIR;  Surgeon: [name], MD;  Location: [place] CV LAB;  Service: Cardiovascular;  Laterality: N/A;   RIGHT/LEFT HEART CATH AND CORONARY/GRAFT ANGIOGRAPHY N/A 04/03/2021   Procedure: RIGHT/LEFT HEART CATH AND CORONARY/GRAFT ANGIOGRAPHY;  Surgeon: Belva Crome, MD;  Location: [place] CV LAB;  Service: Cardiovascular;  Laterality: N/A;   TEE WITHOUT CARDIOVERSION N/A 02/26/2021   Procedure: TRANSESOPHAGEAL ECHOCARDIOGRAM (TEE);  Surgeon: [name], MD;  Location: [facility] ENDOSCOPY;  Service: Cardiovascular;  Laterality: N/A;   TEE WITHOUT CARDIOVERSION N/A 05/09/2021   Procedure: TRANSESOPHAGEAL ECHOCARDIOGRAM (TEE);  Surgeon: [name], MD;  Location: [place] CV LAB;  Service: Cardiovascular;  Laterality: N/A;   TEE WITHOUT CARDIOVERSION N/A 05/22/2022   Procedure: TRANSESOPHAGEAL ECHOCARDIOGRAM (TEE);  Surgeon: [name], MD;  Location: [place];  Service: Cardiovascular;  Laterality: N/A;   TEE WITHOUT CARDIOVERSION N/A 05/29/2022   Procedure: TRANSESOPHAGEAL ECHOCARDIOGRAM (TEE);  Surgeon:  [name], MD;  Location: [place] CV LAB;  Service: Cardiovascular;  Laterality: N/A;    Current Medications: Current Meds  Medication Sig   amoxicillin (AMOXIL) 500 MG tablet Take 4 tablets (2,000 mg) one hour prior to all dental visits.   Cholecalciferol (VITAMIN D) 50 MCG (2000 UT) tablet Take 2,000 Units by mouth in the  morning and at bedtime.   CINNAMON PO Take 1,000 mg by mouth daily.   Coenzyme Q10 (CO Q-10) 200 MG CAPS Take 200 mg by mouth daily.    dorzolamide-timolol (COSOPT) 22.3-6.8 MG/ML ophthalmic solution Place 1 drop into both eyes 2 (two) times daily.   ELIQUIS 5 MG TABS tablet TAKE 1 TABLET BY MOUTH TWICE A DAY   furosemide (LASIX) 20 MG tablet TAKE 1 TABLET BY MOUTH EVERY DAY   Glucosamine-Chondroitin (GLUCOSAMINE CHONDR COMPLEX PO) Take 1 tablet by mouth daily.   isosorbide mononitrate (IMDUR) 30 MG 24 hr tablet TAKE 1 TABLET BY MOUTH EVERY DAY   levOCARNitine (L-CARNITINE) 500 MG TABS Take 500 mg by mouth in the morning.   losartan-hydrochlorothiazide (HYZAAR) 50-12.5 MG tablet Take 1 tablet by mouth daily.   metFORMIN (GLUCOPHAGE) 1000 MG tablet Take 1,000 mg by mouth at bedtime.    metoprolol succinate (TOPROL XL) 25 MG 24 hr tablet Take 1 tablet (25 mg total) by mouth 3 (three) times a week. Take on Mondays, Wednesdays, and Fridays.   Misc Natural Products (NF FORMULAS TESTOSTERONE) CAPS Take 1 capsule by mouth in the morning.   Multiple Vitamins-Minerals (PRESERVISION AREDS 2 PO) Take 1 capsule by mouth 2 (two) times daily.   Omega-3 Fatty Acids (FISH OIL PO) Take 1 capsule by mouth as needed.   potassium chloride SA (KLOR-CON M20) 20 MEQ tablet TAKE 1 TABLET BY MOUTH EVERY DAY   pravastatin (PRAVACHOL) 20 MG tablet Take 20 mg by mouth at bedtime.   Propylene Glycol (SYSTANE BALANCE) 0.6 % SOLN Place 1 drop into both eyes daily as needed (dry eyes).   QUERCETIN PO Take 1 tablet by mouth daily.   vitamin B-12 (CYANOCOBALAMIN) 1000 MCG tablet Take 1,000 mcg by mouth daily.   vitamin E 400 UNIT capsule Take 400 Units by mouth daily.   VYZULTA 0.024 % SOLN Place 1 drop into the right eye at bedtime.     Allergies:   Patient has no known allergies.   Social History   Socioeconomic History   Marital status: Widowed    Spouse name: Not on file   Number of children: Not on file   Years  of education: Not on file   Highest education level: Not on file  Occupational History   Not on file  Tobacco Use   Smoking status: Former   Smokeless tobacco: Never  Vaping Use   Vaping Use: Never used  Substance and Sexual Activity   Alcohol use: Not Currently    Alcohol/week: 1.0 standard drink of alcohol    Types: 1 Glasses of wine per week   Drug use: No   Sexual activity: Yes  Other Topics Concern   Not on file  Social History Narrative   Not on file   Social Determinants of Health   Financial Resource Strain: Not on file  Food Insecurity: Not on file  Transportation Needs: Not on file  Physical Activity: Not on file  Stress: Not on file  Social Connections: Not on file     Family History: The patient's family history includes Healthy in his sister, sister, and  sister; Heart attack in his maternal grandfather; Heart disease in his father.  ROS:   Please see the history of present illness.    All other systems reviewed and are negative.  EKGs/Labs/Other Studies Reviewed:    The following studies were reviewed today: Echo 07/02/2022:  1. S/p mitraclip x 2 (XTW in A2-P2 position, NT in A1-P1 position).  Medial orifice with MG 3.7 mmHG @ 61 bpm. Trivial residual MR. The mitral  valve has been repaired/replaced. Trivial mitral valve regurgitation. No  evidence of mitral stenosis. The mean  mitral valve gradient is 3.7 mmHg with average heart rate of 61 bpm. There  is a Mitra-Clip present in the mitral position. Procedure Date: 05/29/2022.   2. Left ventricular ejection fraction, by estimation, is 60 to 65%. The  left ventricle has normal function. The left ventricle has no regional  wall motion abnormalities. Left ventricular diastolic function could not  be evaluated.   3. Right ventricular systolic function is moderately reduced. The right  ventricular size is moderately enlarged. There is moderately elevated  pulmonary artery systolic pressure. The estimated  right ventricular  systolic pressure is [id number] mmHg.   4. Left atrial size was severely dilated.   5. Right atrial size was severely dilated.   6. Tricuspid valve regurgitation is mild to moderate.   7. The aortic valve is tricuspid. Aortic valve regurgitation is not  visualized. No aortic stenosis is present.   8. The inferior vena cava is normal in size with greater than 50%  respiratory variability, suggesting right atrial pressure of 3 mmHg.   9. Evidence of atrial level shunting detected by color flow Doppler.  There is a small patent foramen ovale with predominantly right to left  shunting across the atrial septum.   Comparison(s): No significant change from prior study.   EKG:  EKG is not ordered today.    Recent Labs: 11/07/2021: NT-Pro BNP 4,141 05/26/2022: ALT 20; B Natriuretic Peptide 771.5 05/30/2022: BUN 22; Creatinine, Ser 1.27; Hemoglobin 12.5; Platelets 160; Potassium 4.6; Sodium 139  Recent Lipid Panel    Component Value Date/Time   CHOL 130 02/25/2021 0249   CHOL 86 (L) 10/10/2019 0744   TRIG 102 02/25/2021 0249   HDL 35 (L) 02/25/2021 0249   HDL 44 10/10/2019 0744   CHOLHDL 3.7 02/25/2021 0249   VLDL 20 02/25/2021 0249   LDLCALC 75 02/25/2021 0249   LDLCALC 24 10/10/2019 0744     Risk Assessment/Calculations:    CHA2DS2-VASc Score = 6   This indicates a 9.7% annual risk of stroke. The patient's score is based upon: CHF History: 1 HTN History: 1 Diabetes History: 1 Stroke History: 0 Vascular Disease History: 1 Age Score: 2 Gender Score: 0               Physical Exam:    VS:  BP 114/60   Pulse 61   Ht 5' 6"$  (1.676 m)   Wt 148 lb 3.2 oz (67.2 kg)   SpO2 97%   BMI 23.92 kg/m     Wt Readings from Last 3 Encounters:  09/16/22 148 lb 3.2 oz (67.2 kg)  08/12/22 139 lb 3.2 oz (63.1 kg)  07/02/22 149 lb (67.6 kg)     GEN:  Well nourished, well developed in no acute distress HEENT: Normal NECK: No JVD; No carotid bruits LYMPHATICS: No  lymphadenopathy CARDIAC: Irregularly irregular with grade 2/6 holosystolic murmur at the apex RESPIRATORY:  Clear to auscultation without rales, wheezing or rhonchi  ABDOMEN: Soft, non-tender, non-distended MUSCULOSKELETAL:  No edema; No deformity  SKIN: Warm and dry NEUROLOGIC:  Alert and oriented x 3 PSYCHIATRIC:  Normal affect   ASSESSMENT:    1. Nonrheumatic mitral valve insufficiency   2. Persistent atrial fibrillation ([place])   3. Chronic diastolic heart failure (HCC)    PLAN:    In order of problems listed above:  The patient appears clinically stable with minimal symptoms at present, occasional shortness of breath but overall functional capacity is good with no significant limitation.  He is scheduled for his 1 year surveillance echocardiogram and office visit.  He will continue his current medical management.  He uses amoxicillin for SBE prophylaxis when indicated. Anticoagulated with apixaban.  Heart rate was slow on his last monitor, and beta-blocker was reduced.  Patient again is asymptomatic on metoprolol succinate 25 mg taken Mondays, Wednesdays, and Fridays. Stable on current regimen.  Continue low-dose furosemide, isosorbide, and losartan/HCTZ.           Medication Adjustments/Labs and Tests Ordered: Current medicines are reviewed at length with the patient today.  Concerns regarding medicines are outlined above.  No orders of the defined types were placed in this encounter.  No orders of the defined types were placed in this encounter.   Patient Instructions  Medication Instructions:  Your physician recommends that you continue on your current medications as directed. Please refer to the Current Medication list given to you today.  *If you need a refill on your cardiac medications before your next appointment, please call your pharmacy*   Lab Work: NONE If you have labs (blood work) drawn today and your tests are completely normal, you will receive your  results only by: [place] (if you have MyChart) OR A paper copy in the mail If you have any lab test that is abnormal or we need to change your treatment, we will call you to review the results.   Testing/Procedures: NONE   Follow-Up: At [facility], you and your health needs are our priority.  As part of our continuing mission to provide you with exceptional heart care, we have created designated Provider Care Teams.  These Care Teams include your primary Cardiologist (physician) and Advanced Practice Providers (APPs -  Physician Assistants and Nurse Practitioners) who all work together to provide you with the care you need, when you need it.  We recommend signing up for the patient portal called "MyChart".  Sign up information is provided on this After Visit Summary.  MyChart is used to connect with patients for Virtual Visits (Telemedicine).  Patients are able to view lab/test results, encounter notes, upcoming appointments, etc.  Non-urgent messages can be sent to your provider as well.   To learn more about what you can do with MyChart, go to [email or web address].    Your next appointment:   6 month(s)  Provider:   [name], MD or APP     Signed, [name], MD  09/16/2022 1:04 PM    [place]

## 2022-09-16 NOTE — Patient Instructions (Signed)
Medication Instructions:  Your physician recommends that you continue on your current medications as directed. Please refer to the Current Medication list given to you today.  *If you need a refill on your cardiac medications before your next appointment, please call your pharmacy*   Lab Work: NONE If you have labs (blood work) drawn today and your tests are completely normal, you will receive your results only by: [place] (if you have MyChart) OR A paper copy in the mail If you have any lab test that is abnormal or we need to change your treatment, we will call you to review the results.   Testing/Procedures: NONE   Follow-Up: At [facility], you and your health needs are our priority.  As part of our continuing mission to provide you with exceptional heart care, we have created designated Provider Care Teams.  These Care Teams include your primary Cardiologist (physician) and Advanced Practice Providers (APPs -  Physician Assistants and Nurse Practitioners) who all work together to provide you with the care you need, when you need it.  We recommend signing up for the patient portal called "MyChart".  Sign up information is provided on this After Visit Summary.  MyChart is used to connect with patients for Virtual Visits (Telemedicine).  Patients are able to view lab/test results, encounter notes, upcoming appointments, etc.  Non-urgent messages can be sent to your provider as well.   To learn more about what you can do with MyChart, go to [email or web address].    Your next appointment:   6 month(s)  Provider:   [name], MD or APP

## 2022-10-21 ENCOUNTER — Encounter (INDEPENDENT_AMBULATORY_CARE_PROVIDER_SITE_OTHER): Payer: Medicare Other | Admitting: Ophthalmology

## 2022-10-21 DIAGNOSIS — H353231 Exudative age-related macular degeneration, bilateral, with active choroidal neovascularization: Secondary | ICD-10-CM

## 2022-10-21 DIAGNOSIS — H35033 Hypertensive retinopathy, bilateral: Secondary | ICD-10-CM | POA: Diagnosis not present

## 2022-10-21 DIAGNOSIS — H43813 Vitreous degeneration, bilateral: Secondary | ICD-10-CM

## 2022-10-21 DIAGNOSIS — I10 Essential (primary) hypertension: Secondary | ICD-10-CM

## 2022-10-22 ENCOUNTER — Encounter (INDEPENDENT_AMBULATORY_CARE_PROVIDER_SITE_OTHER): Payer: Medicare Other | Admitting: Ophthalmology

## 2022-10-22 DIAGNOSIS — H353211 Exudative age-related macular degeneration, right eye, with active choroidal neovascularization: Secondary | ICD-10-CM | POA: Diagnosis not present

## 2022-11-10 DIAGNOSIS — H401133 Primary open-angle glaucoma, bilateral, severe stage: Secondary | ICD-10-CM | POA: Diagnosis not present

## 2022-11-11 ENCOUNTER — Other Ambulatory Visit: Payer: Self-pay | Admitting: Cardiology

## 2022-12-09 ENCOUNTER — Encounter (INDEPENDENT_AMBULATORY_CARE_PROVIDER_SITE_OTHER): Payer: Medicare Other | Admitting: Ophthalmology

## 2022-12-09 DIAGNOSIS — H353231 Exudative age-related macular degeneration, bilateral, with active choroidal neovascularization: Secondary | ICD-10-CM

## 2022-12-09 DIAGNOSIS — H43813 Vitreous degeneration, bilateral: Secondary | ICD-10-CM

## 2022-12-09 DIAGNOSIS — I10 Essential (primary) hypertension: Secondary | ICD-10-CM

## 2022-12-09 DIAGNOSIS — H35033 Hypertensive retinopathy, bilateral: Secondary | ICD-10-CM | POA: Diagnosis not present

## 2022-12-10 ENCOUNTER — Encounter (INDEPENDENT_AMBULATORY_CARE_PROVIDER_SITE_OTHER): Payer: Medicare Other | Admitting: Ophthalmology

## 2022-12-10 DIAGNOSIS — H353211 Exudative age-related macular degeneration, right eye, with active choroidal neovascularization: Secondary | ICD-10-CM | POA: Diagnosis not present

## 2022-12-15 ENCOUNTER — Other Ambulatory Visit: Payer: Self-pay

## 2022-12-15 MED ORDER — APIXABAN 5 MG PO TABS: 5.0000 mg | ORAL_TABLET | Freq: Two times a day (BID) | ORAL | 1 refills | Status: DC

## 2022-12-15 NOTE — Telephone Encounter (Signed)
Prescription refill request for Eliquis received. Indication:afib Last office visit:2/24 Scr:1.2 Age: 82 Weight:67.2  kg  Prescription refilled

## 2023-01-27 ENCOUNTER — Encounter (INDEPENDENT_AMBULATORY_CARE_PROVIDER_SITE_OTHER): Payer: Medicare Other | Admitting: Ophthalmology

## 2023-01-27 DIAGNOSIS — H35033 Hypertensive retinopathy, bilateral: Secondary | ICD-10-CM | POA: Diagnosis not present

## 2023-01-27 DIAGNOSIS — I10 Essential (primary) hypertension: Secondary | ICD-10-CM

## 2023-01-27 DIAGNOSIS — H43813 Vitreous degeneration, bilateral: Secondary | ICD-10-CM

## 2023-01-27 DIAGNOSIS — H353231 Exudative age-related macular degeneration, bilateral, with active choroidal neovascularization: Secondary | ICD-10-CM

## 2023-01-28 ENCOUNTER — Encounter (INDEPENDENT_AMBULATORY_CARE_PROVIDER_SITE_OTHER): Payer: Medicare Other | Admitting: Ophthalmology

## 2023-01-28 DIAGNOSIS — H353211 Exudative age-related macular degeneration, right eye, with active choroidal neovascularization: Secondary | ICD-10-CM | POA: Diagnosis not present

## 2023-02-09 DIAGNOSIS — H401113 Primary open-angle glaucoma, right eye, severe stage: Secondary | ICD-10-CM | POA: Diagnosis not present

## 2023-02-26 ENCOUNTER — Other Ambulatory Visit: Payer: Self-pay | Admitting: Physician Assistant

## 2023-03-02 ENCOUNTER — Other Ambulatory Visit: Payer: Self-pay | Admitting: *Deleted

## 2023-03-02 MED ORDER — ISOSORBIDE MONONITRATE ER 30 MG PO TB24: 30.0000 mg | ORAL_TABLET | Freq: Every day | ORAL | 3 refills | Status: AC

## 2023-03-11 DIAGNOSIS — Z952 Presence of prosthetic heart valve: Secondary | ICD-10-CM | POA: Diagnosis not present

## 2023-03-11 DIAGNOSIS — E1169 Type 2 diabetes mellitus with other specified complication: Secondary | ICD-10-CM | POA: Diagnosis not present

## 2023-03-11 DIAGNOSIS — E785 Hyperlipidemia, unspecified: Secondary | ICD-10-CM | POA: Diagnosis not present

## 2023-03-11 DIAGNOSIS — I4891 Unspecified atrial fibrillation: Secondary | ICD-10-CM | POA: Diagnosis not present

## 2023-03-11 DIAGNOSIS — H401131 Primary open-angle glaucoma, bilateral, mild stage: Secondary | ICD-10-CM | POA: Diagnosis not present

## 2023-03-11 DIAGNOSIS — Z1389 Encounter for screening for other disorder: Secondary | ICD-10-CM | POA: Diagnosis not present

## 2023-03-11 DIAGNOSIS — I700 Atherosclerosis of aorta: Secondary | ICD-10-CM | POA: Diagnosis not present

## 2023-03-11 DIAGNOSIS — Z0000 Encounter for general adult medical examination without abnormal findings: Secondary | ICD-10-CM | POA: Diagnosis not present

## 2023-03-11 DIAGNOSIS — I10 Essential (primary) hypertension: Secondary | ICD-10-CM | POA: Diagnosis not present

## 2023-03-11 DIAGNOSIS — I25119 Atherosclerotic heart disease of native coronary artery with unspecified angina pectoris: Secondary | ICD-10-CM | POA: Diagnosis not present

## 2023-03-17 ENCOUNTER — Encounter (INDEPENDENT_AMBULATORY_CARE_PROVIDER_SITE_OTHER): Payer: Medicare Other | Admitting: Ophthalmology

## 2023-03-17 DIAGNOSIS — H353231 Exudative age-related macular degeneration, bilateral, with active choroidal neovascularization: Secondary | ICD-10-CM | POA: Diagnosis not present

## 2023-03-17 DIAGNOSIS — H43813 Vitreous degeneration, bilateral: Secondary | ICD-10-CM

## 2023-03-17 DIAGNOSIS — H35033 Hypertensive retinopathy, bilateral: Secondary | ICD-10-CM

## 2023-03-17 DIAGNOSIS — I10 Essential (primary) hypertension: Secondary | ICD-10-CM | POA: Diagnosis not present

## 2023-03-18 ENCOUNTER — Encounter (INDEPENDENT_AMBULATORY_CARE_PROVIDER_SITE_OTHER): Payer: Medicare Other | Admitting: Ophthalmology

## 2023-03-18 DIAGNOSIS — H353211 Exudative age-related macular degeneration, right eye, with active choroidal neovascularization: Secondary | ICD-10-CM

## 2023-05-05 ENCOUNTER — Encounter (INDEPENDENT_AMBULATORY_CARE_PROVIDER_SITE_OTHER): Payer: Medicare Other | Admitting: Ophthalmology

## 2023-05-05 DIAGNOSIS — I10 Essential (primary) hypertension: Secondary | ICD-10-CM

## 2023-05-05 DIAGNOSIS — H35033 Hypertensive retinopathy, bilateral: Secondary | ICD-10-CM | POA: Diagnosis not present

## 2023-05-05 DIAGNOSIS — H353231 Exudative age-related macular degeneration, bilateral, with active choroidal neovascularization: Secondary | ICD-10-CM

## 2023-05-05 DIAGNOSIS — H43813 Vitreous degeneration, bilateral: Secondary | ICD-10-CM | POA: Diagnosis not present

## 2023-05-06 ENCOUNTER — Encounter (INDEPENDENT_AMBULATORY_CARE_PROVIDER_SITE_OTHER): Payer: Medicare Other | Admitting: Ophthalmology

## 2023-05-06 DIAGNOSIS — H353211 Exudative age-related macular degeneration, right eye, with active choroidal neovascularization: Secondary | ICD-10-CM

## 2023-05-07 ENCOUNTER — Other Ambulatory Visit: Payer: Self-pay

## 2023-05-07 MED ORDER — POTASSIUM CHLORIDE CRYS ER 20 MEQ PO TBCR: 20.0000 meq | EXTENDED_RELEASE_TABLET | Freq: Every day | ORAL | 1 refills | Status: AC

## 2023-06-02 NOTE — Progress Notes (Unsigned)
[facility]   MULTIDISCIPLINARY HEART VALVE TEAM  Structural Heart Office Note:  .    Date:  06/03/2023  ID:  Micheal [name], DOB Sep 23, 1940, MRN [id number] PCP: Lorenda Ishihara, MD  [facility] Providers Cardiologist:  Tonny Bollman, MD  History of Present Illness: .   Micheal [name] is a 82 y.o. male with a hx of CAD s/p CABG 2007, DM2, persistent atrial fibrillation on Eliquis, chronic CHF, PAD, and severe mitral regurgitation s/p TEER 04/2021 who developed worsening SOB and fatigue over the last several months found to have severe residual MR due to persistent bileaflet prolapse and is now s/p second MitraClip procedure with a single MitraClip NT device positioned A1/P1 who is here today for 1 year follow up.    Mr. Molstad was initially referred to Dr. Excell Seltzer 03/05/21 for consideration of treatment options regarding severe mitral regurgitation. He had recently been diagnosed with atrial fibrillation 02/24/21 when he presented with dyspnea, fatigue, and clinical signs of congestive heart failure. He underwent TEE guided cardioversion 02/26/21 and was found to have moderate/severe mitral regurgitation with bileaflet prolapse. He subsequently underwent right and left cardiac catheterization 04/03/2021 which demonstrated occlusion of the SBG>OM with severe disease in the native OM, however this was noted to be a very small vessel with recommendations for continued medical treatment. The SVG to PDA and PLA and the LIMA to LAD graft were both patent. There was 32 mmHg V waves in the wedge position and with moderate pulmonary hypertension, consistent with hemodynamically important mitral regurgitation.  He underwent first successful mTEER with one XTW clip 05/09/2021. He initially did well however began to have symptoms of exertional fatigue and SOB. Repeat echocardiogram showed normal LV function with worsening of his mitral regurgitation. TEE with severe eccentric mitral  regurgitation with a stable MitraClip device in the A2/P2 position. He was seen back with Dr. Excell Seltzer with plans for an additional clip placement performed 05/29/2022.    In follow up, he has done very well. His biggest complaint today is his progressive loss of vision. He gets shots bi monthly to help slow this. He also has some LE weakness and follows Dr. Kirke Corin for this. Otherwise, he is playing gold 3-4 times per week. He denies chest pain, SOB, palpitations, LE edema, orthopnea, PND, dizziness, or syncope. Denies bleeding in stool or urine.    Physical Exam:   VS:  BP 126/70   Pulse (!) 46   Ht 5\' 10"  (1.778 m)   Wt 151 lb 9.6 oz (68.8 kg)   SpO2 97%   BMI 21.75 kg/m    Wt Readings from Last 3 Encounters:  06/03/23 151 lb 9.6 oz (68.8 kg)  09/16/22 148 lb 3.2 oz (67.2 kg)  08/12/22 139 lb 3.2 oz (63.1 kg)    General: Well developed, well nourished, NAD Cardiovascular: Irregularly irregular. No murmurs Extremities: No edema. No clubbing or cyanosis. DP/PT pulses 2+ bilaterally Neuro: Alert and oriented. No focal deficits. No facial asymmetry. MAE spontaneously. Psych: Responds to questions appropriately with normal affect.    Risk Assessment/Calculations:    CHA2DS2-VASc Score = 6   This indicates a 9.7% annual risk of stroke. The patient's score is based upon: CHF History: 1 HTN History: 1 Diabetes History: 1 Stroke History: 0 Vascular Disease History: 1 Age Score: 2 Gender Score: 0  ASSESSMENT AND PLAN: .    Severe non-rheumatic mitral valve insufficiency: Continues to do very well s/p MitraClip placement x2 with NYHA  class I symptoms. Echo today with normal LV function and stable MitraClip XTW between A2/P2 with mild residual MR with a mean gradient at with HR 50bpm. NT MitraClip in the A1/P1 position. There is moderate to severe TR present although he remains asymptomatic. Continue Eliquis 5mg  BID and lifelong dental SBE. Plan to follow with Dr. Excell Seltzer 09/2023  (previously a patient of Dr. Katrinka Blazing).    Persistent atrial fibrillation: Rates stable today. Continue Eliquis with no ASA in the setting of DOAC.   CAD: s/p CABG 2007 with pre TEER cath showing occluded SVG to OM, OM with small focal 95% stenosis with no intervention due to vessel size, patent LM, patent SVG to PDA/PL, CTO of the prox/mid LAD, CTO of the prox/mid RCA, and LCX with moderate OM 95% stenosis. Denies anginal symptoms. Continue current regimen. No ASA with DOAC. Continue Toprol, statin, Co-Q10.    Chronic Diastolic CHF: Appears euvolemic on exam. Continue Lasix 20mg  QD. No changes today.    DM2: Continue Metformin. Follows closely with PCP  PAD: Follows with Dr. Kirke Corin with persistent exertional claudication symptoms although no evidence of occlusive disease with imaging. To see Dr. Kirke Corin 07/2023.   Signed, Georgie Chard, NP

## 2023-06-03 ENCOUNTER — Ambulatory Visit (HOSPITAL_COMMUNITY): Payer: Medicare Other | Attending: Cardiovascular Disease

## 2023-06-03 ENCOUNTER — Other Ambulatory Visit: Payer: Self-pay | Admitting: Cardiology

## 2023-06-03 ENCOUNTER — Ambulatory Visit (INDEPENDENT_AMBULATORY_CARE_PROVIDER_SITE_OTHER): Payer: Medicare Other | Admitting: Cardiology

## 2023-06-03 VITALS — BP 126/70 | HR 46 | Ht 70.0 in | Wt 151.6 lb

## 2023-06-03 DIAGNOSIS — Z9889 Other specified postprocedural states: Secondary | ICD-10-CM | POA: Insufficient documentation

## 2023-06-03 DIAGNOSIS — I340 Nonrheumatic mitral (valve) insufficiency: Secondary | ICD-10-CM

## 2023-06-03 DIAGNOSIS — I25810 Atherosclerosis of coronary artery bypass graft(s) without angina pectoris: Secondary | ICD-10-CM | POA: Insufficient documentation

## 2023-06-03 DIAGNOSIS — I739 Peripheral vascular disease, unspecified: Secondary | ICD-10-CM | POA: Insufficient documentation

## 2023-06-03 DIAGNOSIS — I25118 Atherosclerotic heart disease of native coronary artery with other forms of angina pectoris: Secondary | ICD-10-CM

## 2023-06-03 DIAGNOSIS — I10 Essential (primary) hypertension: Secondary | ICD-10-CM | POA: Diagnosis not present

## 2023-06-03 DIAGNOSIS — I4819 Other persistent atrial fibrillation: Secondary | ICD-10-CM | POA: Insufficient documentation

## 2023-06-03 DIAGNOSIS — Z95818 Presence of other cardiac implants and grafts: Secondary | ICD-10-CM | POA: Diagnosis not present

## 2023-06-03 DIAGNOSIS — I5032 Chronic diastolic (congestive) heart failure: Secondary | ICD-10-CM | POA: Diagnosis not present

## 2023-06-03 LAB — ECHOCARDIOGRAM COMPLETE
Area-P 1/2: 1.59 cm2
MV VTI: 0.91 cm2
S' Lateral: 2.7 cm

## 2023-06-03 NOTE — Patient Instructions (Signed)
Medication Instructions:  Your physician recommends that you continue on your current medications as directed. Please refer to the Current Medication list given to you today.  *If you need a refill on your cardiac medications before your next appointment, please call your pharmacy*  Follow-Up: At [facility], you and your health needs are our priority.  As part of our continuing mission to provide you with exceptional heart care, we have created designated Provider Care Teams.  These Care Teams include your primary Cardiologist (physician) and Advanced Practice Providers (APPs -  Physician Assistants and Nurse Practitioners) who all work together to provide you with the care you need, when you need it.  Your next appointment:   4 months with Dr. Excell Seltzer

## 2023-06-30 ENCOUNTER — Encounter (INDEPENDENT_AMBULATORY_CARE_PROVIDER_SITE_OTHER): Payer: Medicare Other | Admitting: Ophthalmology

## 2023-06-30 DIAGNOSIS — H43813 Vitreous degeneration, bilateral: Secondary | ICD-10-CM | POA: Diagnosis not present

## 2023-06-30 DIAGNOSIS — H35033 Hypertensive retinopathy, bilateral: Secondary | ICD-10-CM | POA: Diagnosis not present

## 2023-06-30 DIAGNOSIS — H353231 Exudative age-related macular degeneration, bilateral, with active choroidal neovascularization: Secondary | ICD-10-CM

## 2023-06-30 DIAGNOSIS — I10 Essential (primary) hypertension: Secondary | ICD-10-CM

## 2023-07-01 ENCOUNTER — Encounter (INDEPENDENT_AMBULATORY_CARE_PROVIDER_SITE_OTHER): Payer: Medicare Other | Admitting: Ophthalmology

## 2023-07-01 DIAGNOSIS — H353211 Exudative age-related macular degeneration, right eye, with active choroidal neovascularization: Secondary | ICD-10-CM

## 2023-07-17 ENCOUNTER — Other Ambulatory Visit: Payer: Self-pay

## 2023-07-17 MED ORDER — LOSARTAN POTASSIUM-HCTZ 50-12.5 MG PO TABS: 1.0000 | ORAL_TABLET | Freq: Every day | ORAL | 3 refills | Status: AC

## 2023-08-01 ENCOUNTER — Other Ambulatory Visit: Payer: Self-pay | Admitting: Cardiovascular Disease

## 2023-08-03 NOTE — Telephone Encounter (Signed)
 Prescription refill request for Eliquis received. Indication:afib Last office visit:11/24 UJW:[id number] labs Age: 83 Weight:68.8  kg  Prescription refilled

## 2023-08-11 ENCOUNTER — Ambulatory Visit: Payer: Medicare Other | Attending: Cardiovascular Disease | Admitting: Cardiovascular Disease

## 2023-08-11 VITALS — BP 116/72 | HR 33 | Ht 66.0 in | Wt 150.8 lb

## 2023-08-11 DIAGNOSIS — I739 Peripheral vascular disease, unspecified: Secondary | ICD-10-CM | POA: Diagnosis not present

## 2023-08-11 DIAGNOSIS — E785 Hyperlipidemia, unspecified: Secondary | ICD-10-CM | POA: Insufficient documentation

## 2023-08-11 DIAGNOSIS — I4819 Other persistent atrial fibrillation: Secondary | ICD-10-CM | POA: Insufficient documentation

## 2023-08-11 DIAGNOSIS — H401113 Primary open-angle glaucoma, right eye, severe stage: Secondary | ICD-10-CM | POA: Diagnosis not present

## 2023-08-11 DIAGNOSIS — E119 Type 2 diabetes mellitus without complications: Secondary | ICD-10-CM | POA: Diagnosis not present

## 2023-08-11 DIAGNOSIS — I10 Essential (primary) hypertension: Secondary | ICD-10-CM | POA: Insufficient documentation

## 2023-08-11 DIAGNOSIS — I25810 Atherosclerosis of coronary artery bypass graft(s) without angina pectoris: Secondary | ICD-10-CM | POA: Diagnosis not present

## 2023-08-11 NOTE — Patient Instructions (Signed)

## 2023-08-11 NOTE — Progress Notes (Signed)
 Cardiology Office Note   Date:  08/11/2023   ID:  Micheal [name], DOB [birth year], MRN [id number]  PCP:  Elliot Charm, MD  Cardiologist:  Dr. Wonda  No chief complaint on file.     History of Present Illness: Micheal [name] is a 83 y.o. male who is here today for follow-up visit regarding peripheral arterial disease.   Patient has known history of coronary artery disease status post CABG in 2008, mitral regurgitation status post mitral valve clip, atrial fibrillation, type 2 diabetes, hypertension and hyperlipidemia.  He is a previous smoker and quit in 2008.  He has no family history of coronary artery disease. 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 He was seen in 2020 for bilateral leg pain that starts in the thigh area and goes down.  He underwent lower extremity arterial Doppler which showed an ABI of 1.07 on the right and 0.84 on the left.  Given significant limitations with symptoms, I proceeded with angiography and February 2020  which showed no significant aortoiliac disease.  On the right side, there was mild SFA disease with two-vessel runoff below the knee.  On the left side, there was mild SFA disease with one-vessel runoff below the knee via the peroneal artery.  These findings could not explain his symptoms especially that he does not complain of calf or foot claudication.  He has been doing well with no chest pain, shortness of breath or palpitations.  He continues to report exertional leg pain and fatigue mainly when he goes uphill.  Nonetheless, he continues to be active and plays golf at least 3 times per week.  Past Medical History:  Diagnosis Date   CAD (coronary artery disease)    with Cabg 2008   Claudication (HCC)    Diabetes (HCC)    HTN (hypertension)    Hypercholesteremia    Hyperlipidemia    S/P mitral valve clip implantation 05/09/2021   one XTW clip positioned on A2/P2 per Dr.  Marven Red    Past Surgical History:  Procedure Laterality Date   ABDOMINAL AORTOGRAM W/LOWER EXTREMITY N/A 09/22/2018   Procedure: ABDOMINAL AORTOGRAM W/LOWER EXTREMITY;  Surgeon: Darron Deatrice LABOR, MD;  Location: [facility] INVASIVE CV LAB;  Service: Cardiovascular;  Laterality: N/A;   CARDIAC CATHETERIZATION  04/03/2021   CARDIOVERSION N/A 02/26/2021   Procedure: CARDIOVERSION;  Surgeon: Alveta Aleene PARAS, MD;  Location: [facility] ENDOSCOPY;  Service: Cardiovascular;  Laterality: N/A;   CORONARY ARTERY BYPASS GRAFT  2008   Coronary bypass surgery     2008   EYE SURGERY Bilateral    laser surgery by Dr. Will   RIGHT/LEFT HEART CATH AND CORONARY/GRAFT ANGIOGRAPHY N/A 04/03/2021   Procedure: RIGHT/LEFT HEART CATH AND CORONARY/GRAFT ANGIOGRAPHY;  Surgeon: Claudene Victory ORN, MD;  Location: [facility] INVASIVE CV LAB;  Service: Cardiovascular;  Laterality: N/A;   TEE WITHOUT CARDIOVERSION N/A 02/26/2021   Procedure: TRANSESOPHAGEAL ECHOCARDIOGRAM (TEE);  Surgeon: Alveta Aleene PARAS, MD;  Location: [facility] ENDOSCOPY;  Service: Cardiovascular;  Laterality: N/A;   TEE WITHOUT CARDIOVERSION N/A 05/09/2021   Procedure: TRANSESOPHAGEAL ECHOCARDIOGRAM (TEE);  Surgeon: Thukkani, Arun K, MD;  Location: [facility] INVASIVE CV LAB;  Service: Cardiovascular;  Laterality: N/A;   TEE WITHOUT CARDIOVERSION N/A 05/22/2022   Procedure: TRANSESOPHAGEAL ECHOCARDIOGRAM (TEE);  Surgeon: Barbaraann Darryle Ned, MD;  Location: [facility] ENDOSCOPY;  Service: Cardiovascular;  Laterality: N/A;   TEE WITHOUT CARDIOVERSION N/A 05/29/2022   Procedure: TRANSESOPHAGEAL  ECHOCARDIOGRAM (TEE);  Surgeon: Wonda Sharper, MD;  Location: [facility] INVASIVE CV LAB;  Service: Cardiovascular;  Laterality: N/A;   TRANSCATHETER MITRAL EDGE TO EDGE REPAIR N/A 05/09/2021   Procedure: MITRAL VALVE REPAIR;  Surgeon: Wendel Lurena POUR, MD;  Location: [facility] INVASIVE CV LAB;  Service: Cardiovascular;  Laterality: N/A;   TRANSCATHETER MITRAL EDGE TO EDGE REPAIR N/A 05/29/2022   Procedure:  MITRAL VALVE REPAIR;  Surgeon: Wonda Sharper, MD;  Location: [facility] INVASIVE CV LAB;  Service: Cardiovascular;  Laterality: N/A;     Current Outpatient Medications  Medication Sig Dispense Refill   apixaban  (ELIQUIS ) 5 MG TABS tablet Take 1 tablet (5 mg total) by mouth 2 (two) times daily. Needs labs at next cardiology appt for Eliquis  Refills 60 tablet 0   Cholecalciferol (VITAMIN D) 50 MCG (2000 UT) tablet Take 2,000 Units by mouth in the morning and at bedtime.     CINNAMON PO Take 1,000 mg by mouth daily.     Coenzyme Q10 (CO Q-10) 200 MG CAPS Take 200 mg by mouth daily.      dorzolamide-timolol (COSOPT) 22.3-6.8 MG/ML ophthalmic solution Place 1 drop into both eyes 2 (two) times daily.     furosemide  (LASIX ) 20 MG tablet TAKE 1 TABLET BY MOUTH EVERY DAY 90 tablet 1   Glucosamine-Chondroitin (GLUCOSAMINE CHONDR COMPLEX PO) Take 1 tablet by mouth daily.     isosorbide  mononitrate (IMDUR ) 30 MG 24 hr tablet Take 1 tablet (30 mg total) by mouth daily. 90 tablet 3   levOCARNitine (L-CARNITINE) 500 MG TABS Take 500 mg by mouth in the morning.     losartan -hydrochlorothiazide  (HYZAAR) 50-12.5 MG tablet Take 1 tablet by mouth daily. 90 tablet 3   metFORMIN  (GLUCOPHAGE ) 1000 MG tablet Take 1,000 mg by mouth at bedtime.      metoprolol  succinate (TOPROL -XL) 25 MG 24 hr tablet TAKE 1 TABLET (25 MG TOTAL) BY MOUTH DAILY. 90 tablet 3   Misc Natural Products (NF FORMULAS TESTOSTERONE) CAPS Take 1 capsule by mouth in the morning.     Multiple Vitamins-Minerals (PRESERVISION AREDS 2 PO) Take 1 capsule by mouth 2 (two) times daily.     Omega-3 Fatty Acids (FISH OIL PO) Take 1 capsule by mouth as needed.     potassium chloride  SA (KLOR-CON  M20) 20 MEQ tablet Take 1 tablet (20 mEq total) by mouth daily. 90 tablet 1   pravastatin  (PRAVACHOL ) 20 MG tablet Take 20 mg by mouth at bedtime.     Propylene Glycol (SYSTANE BALANCE) 0.6 % SOLN Place 1 drop into both eyes daily as needed (dry eyes).     QUERCETIN PO  Take 1 tablet by mouth daily.     vitamin B-12 (CYANOCOBALAMIN ) 1000 MCG tablet Take 1,000 mcg by mouth daily.     vitamin E 400 UNIT capsule Take 400 Units by mouth daily.     VYZULTA 0.024 % SOLN Place 1 drop into the right eye at bedtime.     amoxicillin  (AMOXIL ) 500 MG tablet TAKE 4 TABLETS ONE HOUR PRIOR TO DENTAL APPOINTMENT (Patient not taking: Reported on 08/11/2023) 8 tablet 3   No current facility-administered medications for this visit.    Allergies:   Patient has no known allergies.    Social History:  The patient  reports that he has quit smoking. He has never used smokeless tobacco. He reports that he does not currently use alcohol after a past usage of about 1.0 standard drink of alcohol per week. He reports that he does not  use drugs.   Family History:  The patient's family history includes Healthy in his sister, sister, and sister; Heart attack in his maternal grandfather; Heart disease in his father.    ROS:  Please see the history of present illness.   Otherwise, review of systems are positive for none.   All other systems are reviewed and negative.    PHYSICAL EXAM: VS:  BP 116/72 (BP Location: Left Arm, Patient Position: Sitting)   Pulse (!) 33   Ht 5' 6 (1.676 m)   Wt 150 lb 12.8 oz (68.4 kg)   SpO2 (!) 89%   BMI 24.34 kg/m  , BMI Body mass index is 24.34 kg/m. GEN: Well nourished, well developed, in no acute distress  HEENT: normal  Neck: no JVD, carotid bruits, or masses Cardiac: Irregularly irregular; no rubs, or gallops,no edema . 1/6 holosystolic murmur at the apex. Respiratory:  clear to auscultation bilaterally, normal work of breathing GI: soft, nontender, nondistended, + BS MS: no deformity or atrophy  Skin: warm and dry, no rash Neuro:  Strength and sensation are intact Psych: euthymic mood, full affect Vascular: Femoral pulse: Slightly diminished bilaterally.  Distal pulses are not palpable.   EKG:  EKG  is not ordered today.   Recent  Labs: No results found for requested labs within last 365 days.    Lipid Panel    Component Value Date/Time   CHOL 130 02/25/2021 0249   CHOL 86 (L) 10/10/2019 0744   TRIG 102 02/25/2021 0249   HDL 35 (L) 02/25/2021 0249   HDL 44 10/10/2019 0744   CHOLHDL 3.7 02/25/2021 0249   VLDL 20 02/25/2021 0249   LDLCALC 75 02/25/2021 0249   LDLCALC 24 10/10/2019 0744      Wt Readings from Last 3 Encounters:  08/11/23 150 lb 12.8 oz (68.4 kg)  06/03/23 151 lb 9.6 oz (68.8 kg)  09/16/22 148 lb 3.2 oz (67.2 kg)          No data to display            ASSESSMENT AND PLAN:  1.  Peripheral arterial disease with atypical exertional leg pain: He continues to have mild claudication that does not seem to be lifestyle limiting.  I recommend continuing medical therapy.  No indication for revascularization.  2.  Coronary artery disease involving native coronary arteries without angina: He is doing well overall with no anginal symptoms and continues to be very active.  3.  Hyperlipidemia: Continue treatment with a statin with a target LDL of less than 70.  Most recent lipid profile showed an LDL of 83.  4.  Essential hypertension: Blood pressure is controlled.  5.  Status post mitral valve clip for severe mitral regurgitation: Stable overall.  5.  Persistent atrial fibrillation: Continue rate control and anticoagulation.    Disposition:  FU with me in 1 year  Signed,  Deatrice Cage, MD  08/11/2023 9:37 AM    [facility]

## 2023-08-25 ENCOUNTER — Encounter (INDEPENDENT_AMBULATORY_CARE_PROVIDER_SITE_OTHER): Payer: Medicare Other | Admitting: Ophthalmology

## 2023-08-25 DIAGNOSIS — H35033 Hypertensive retinopathy, bilateral: Secondary | ICD-10-CM

## 2023-08-25 DIAGNOSIS — H43813 Vitreous degeneration, bilateral: Secondary | ICD-10-CM | POA: Diagnosis not present

## 2023-08-25 DIAGNOSIS — I10 Essential (primary) hypertension: Secondary | ICD-10-CM | POA: Diagnosis not present

## 2023-08-25 DIAGNOSIS — H353231 Exudative age-related macular degeneration, bilateral, with active choroidal neovascularization: Secondary | ICD-10-CM | POA: Diagnosis not present

## 2023-08-26 ENCOUNTER — Encounter (INDEPENDENT_AMBULATORY_CARE_PROVIDER_SITE_OTHER): Payer: Medicare Other | Admitting: Ophthalmology

## 2023-08-26 DIAGNOSIS — H353211 Exudative age-related macular degeneration, right eye, with active choroidal neovascularization: Secondary | ICD-10-CM

## 2023-09-09 DIAGNOSIS — R634 Abnormal weight loss: Secondary | ICD-10-CM | POA: Diagnosis not present

## 2023-09-09 DIAGNOSIS — Z952 Presence of prosthetic heart valve: Secondary | ICD-10-CM | POA: Diagnosis not present

## 2023-09-09 DIAGNOSIS — I5032 Chronic diastolic (congestive) heart failure: Secondary | ICD-10-CM | POA: Diagnosis not present

## 2023-09-09 DIAGNOSIS — I10 Essential (primary) hypertension: Secondary | ICD-10-CM | POA: Diagnosis not present

## 2023-09-09 DIAGNOSIS — H401113 Primary open-angle glaucoma, right eye, severe stage: Secondary | ICD-10-CM | POA: Diagnosis not present

## 2023-09-09 DIAGNOSIS — E1169 Type 2 diabetes mellitus with other specified complication: Secondary | ICD-10-CM | POA: Diagnosis not present

## 2023-09-09 DIAGNOSIS — Z23 Encounter for immunization: Secondary | ICD-10-CM | POA: Diagnosis not present

## 2023-09-09 DIAGNOSIS — I25119 Atherosclerotic heart disease of native coronary artery with unspecified angina pectoris: Secondary | ICD-10-CM | POA: Diagnosis not present

## 2023-09-09 DIAGNOSIS — I739 Peripheral vascular disease, unspecified: Secondary | ICD-10-CM | POA: Diagnosis not present

## 2023-09-09 DIAGNOSIS — E785 Hyperlipidemia, unspecified: Secondary | ICD-10-CM | POA: Diagnosis not present

## 2023-09-09 DIAGNOSIS — I4891 Unspecified atrial fibrillation: Secondary | ICD-10-CM | POA: Diagnosis not present

## 2023-09-21 DIAGNOSIS — H04123 Dry eye syndrome of bilateral lacrimal glands: Secondary | ICD-10-CM | POA: Diagnosis not present

## 2023-10-03 ENCOUNTER — Other Ambulatory Visit: Payer: Self-pay | Admitting: Cardiovascular Disease

## 2023-10-05 NOTE — Telephone Encounter (Signed)
 Prescription refill request for Eliquis received. Indication:afib Last office visit:1/25 ZOX:WRUEA labs Age: 83 Weight:68.4  kg  Prescription refilled

## 2023-10-13 ENCOUNTER — Ambulatory Visit: Payer: Medicare Other | Attending: Cardiovascular Disease | Admitting: Cardiovascular Disease

## 2023-10-13 ENCOUNTER — Encounter: Payer: Self-pay | Admitting: Cardiovascular Disease

## 2023-10-13 VITALS — BP 120/80 | HR 55 | Ht 70.0 in | Wt 149.6 lb

## 2023-10-13 DIAGNOSIS — Z95818 Presence of other cardiac implants and grafts: Secondary | ICD-10-CM

## 2023-10-13 DIAGNOSIS — I4819 Other persistent atrial fibrillation: Secondary | ICD-10-CM

## 2023-10-13 DIAGNOSIS — I10 Essential (primary) hypertension: Secondary | ICD-10-CM

## 2023-10-13 DIAGNOSIS — Z9889 Other specified postprocedural states: Secondary | ICD-10-CM | POA: Diagnosis not present

## 2023-10-13 DIAGNOSIS — I25810 Atherosclerosis of coronary artery bypass graft(s) without angina pectoris: Secondary | ICD-10-CM | POA: Diagnosis not present

## 2023-10-13 MED ORDER — APIXABAN 5 MG PO TABS: 5.0000 mg | ORAL_TABLET | Freq: Two times a day (BID) | ORAL | 0 refills | Status: DC

## 2023-10-13 NOTE — Progress Notes (Unsigned)
 Cardiology Office Note:    Date:  10/13/2023   ID:  Micheal [name], DOB 1940-11-09, MRN [id number]  PCP:  Lorenda Ishihara, MD   [facility] Providers Cardiologist:  Tonny Bollman, MD     Referring MD: Lorenda Ishihara,*   Chief Complaint  Patient presents with   Atrial Fibrillation    History of Present Illness:    Micheal [name] is a 83 y.o. male with a hx of CAD s/p CABG 2007, DM2, persistent atrial fibrillation on Eliquis, chronic CHF, PAD, and severe mitral regurgitation s/p TEER 04/2021 who developed worsening SOB and fatigue the following year and was found to have severe residual MR due to persistent bileaflet prolapse. He presented to [facility] for repeat Mitral valve edge to edge repair 05/29/22, undergoing implant with a MitraClip NT device positioned A1P1, reducing MR from severe to trivial post-procedure.   The patient is here alone today.  He has been doing pretty well.  States that he is playing golf about 3 days/week and he is "playing the best golf of his life."  He is feeling well and denies any chest pain, chest pressure, or shortness of breath.  He does have occasional dyspnea if he forgets his furosemide but otherwise doing well.  No orthopnea or PND.  No other complaints at present.   Current Medications: Current Meds  Medication Sig   amoxicillin (AMOXIL) 500 MG tablet TAKE 4 TABLETS ONE HOUR PRIOR TO DENTAL APPOINTMENT   Cholecalciferol (VITAMIN D) 50 MCG (2000 UT) tablet Take 2,000 Units by mouth in the morning and at bedtime.   CINNAMON PO Take 1,000 mg by mouth daily.   Coenzyme Q10 (CO Q-10) 200 MG CAPS Take 200 mg by mouth daily.    dorzolamide-timolol (COSOPT) 22.3-6.8 MG/ML ophthalmic solution Place 1 drop into both eyes 2 (two) times daily.   furosemide (LASIX) 20 MG tablet TAKE 1 TABLET BY MOUTH EVERY DAY   Glucosamine-Chondroitin (GLUCOSAMINE CHONDR COMPLEX PO) Take 1 tablet by mouth daily.   isosorbide mononitrate (IMDUR) 30 MG 24  hr tablet Take 1 tablet (30 mg total) by mouth daily.   levOCARNitine (L-CARNITINE) 500 MG TABS Take 500 mg by mouth in the morning.   losartan-hydrochlorothiazide (HYZAAR) 50-12.5 MG tablet Take 1 tablet by mouth daily.   Magnesium 400 MG CAPS    metFORMIN (GLUCOPHAGE) 1000 MG tablet Take 1,000 mg by mouth at bedtime.    metoprolol succinate (TOPROL-XL) 25 MG 24 hr tablet TAKE 1 TABLET (25 MG TOTAL) BY MOUTH DAILY.   Misc Natural Products (NF FORMULAS TESTOSTERONE) CAPS Take 1 capsule by mouth in the morning.   Multiple Vitamins-Minerals (PRESERVISION AREDS 2 PO) Take 1 capsule by mouth 2 (two) times daily.   Omega-3 Fatty Acids (FISH OIL PO) Take 1 capsule by mouth as needed.   potassium chloride SA (KLOR-CON M20) 20 MEQ tablet Take 1 tablet (20 mEq total) by mouth daily.   pravastatin (PRAVACHOL) 20 MG tablet Take 20 mg by mouth at bedtime.   QUERCETIN PO Take 1 tablet by mouth daily.   vitamin B-12 (CYANOCOBALAMIN) 1000 MCG tablet Take 1,000 mcg by mouth daily.   vitamin E 400 UNIT capsule Take 400 Units by mouth daily.   VYZULTA 0.024 % SOLN Place 1 drop into the right eye at bedtime.   [DISCONTINUED] Carnitine-B5-B6 (L-CARNITINE 500) 500-15-5 MG TABS as directed Orally   [DISCONTINUED] ELIQUIS 5 MG TABS tablet TAKE 1 TABLET BY MOUTH 2 (TWO) TIMES DAILY. NEEDS LABS AT NEXT  CARDIOLOGY APPT FOR ELIQUIS REFILLS   [DISCONTINUED] Propylene Glycol (SYSTANE BALANCE) 0.6 % SOLN Place 1 drop into both eyes daily as needed (dry eyes).     Allergies:   Patient has no known allergies.   ROS:   Please see the history of present illness.    Positive for leg weakness. All other systems reviewed and are negative.  EKGs/Labs/Other Studies Reviewed:    The following studies were reviewed today: Cardiac Studies & Procedures   ______________________________________________________________________________________________ CARDIAC CATHETERIZATION  CARDIAC CATHETERIZATION  05/29/2022  Narrative Successful transcatheter edge-to-edge repair of the mitral valve using a single MitraClip NT device positioned A1/P1, reducing mitral regurgitation from 4+ at baseline to trace post procedure.   CARDIAC CATHETERIZATION  CARDIAC CATHETERIZATION 05/09/2021  Narrative Successful transcatheter mitral edge to edge repair with one XTW clip positioned on A2/P2 reducing severe degenerative nonrheumatic mitral regurgitation to mild to moderate mitral regurgitation.  RECOMMENDATIONS:  Admit for observation, restart home anticoagulation, and obtain limited echocardiogram tomorrow.     ECHOCARDIOGRAM  ECHOCARDIOGRAM COMPLETE 06/03/2023  Narrative ECHOCARDIOGRAM REPORT    Patient Name:   Micheal [name] Date of Exam: 06/03/2023 Medical Rec #:  [id number]       Height:       66.0 in Accession #:    [id number]      Weight:       148.2 lb Date of Birth:  [birth year]       BSA:          1.761 m Patient Age:    82 years        BP:           114/60 mmHg Patient Gender: M               HR:           60 bpm. Exam Location:  [address]  Procedure: 2D Echo, Cardiac Doppler and Color Doppler  Indications:    Z95.2 Status post Mitral Clip Implantation  History:        Patient has prior history of Echocardiogram examinations, most recent 07/02/2022. CAD, Prior CABG and Mitral Valve Repair-Mitral Clip Implantation.; Risk Factors:Hypertension, Diabetes and Dyslipidemia.  Sonographer:    Daphine Deutscher RDCS Referring Phys: [phone] JILL D MCDANIEL  IMPRESSIONS   1. Left ventricular ejection fraction, by estimation, is 55 to 60%. The left ventricle has normal function. The left ventricle has no regional wall motion abnormalities. Left ventricular diastolic parameters are indeterminate. 2. Right ventricular systolic function is normal. The right ventricular size is normal. 3. Left atrial size was severely dilated. 4. Right atrial size was severely dilated. 5. Post mitral  clip XTW between A2/P2 mild residual MR with mean diastolic gradient 3 mmhg at HR of 50 bpm. The mitral valve has been repaired/replaced. No evidence of mitral valve regurgitation. No evidence of mitral stenosis. 6. Tricuspid valve regurgitation is moderate to severe. 7. The aortic valve is tricuspid. There is mild calcification of the aortic valve. There is mild thickening of the aortic valve. Aortic valve regurgitation is not visualized. Aortic valve sclerosis is present, with no evidence of aortic valve stenosis. 8. The inferior vena cava is normal in size with greater than 50% respiratory variability, suggesting right atrial pressure of 3 mmHg.  FINDINGS Left Ventricle: Left ventricular ejection fraction, by estimation, is 55 to 60%. The left ventricle has normal function. The left ventricle has no regional wall motion abnormalities. The left ventricular internal cavity size was normal in size.  There is no left ventricular hypertrophy. Left ventricular diastolic parameters are indeterminate.  Right Ventricle: The right ventricular size is normal. No increase in right ventricular wall thickness. Right ventricular systolic function is normal.  Left Atrium: Left atrial size was severely dilated.  Right Atrium: Right atrial size was severely dilated.  Pericardium: There is no evidence of pericardial effusion.  Post mitral clip XTW between A2/P2 mild residual MR with mean diastolic gradient 3 mmhg at HR of 50 bpm. The mitral valve has been repaired/replaced. No evidence of mitral valve regurgitation. No evidence of mitral valve stenosis. Tricuspid Valve: The tricuspid valve is normal in structure. Tricuspid valve regurgitation is moderate to severe. No evidence of tricuspid stenosis.  Aortic Valve: The aortic valve is tricuspid. There is mild calcification of the aortic valve. There is mild thickening of the aortic valve. Aortic valve regurgitation is not visualized. Aortic valve sclerosis is  present, with no evidence of aortic valve stenosis.  Pulmonic Valve: The pulmonic valve was normal in structure. Pulmonic valve regurgitation is mild. No evidence of pulmonic stenosis.  Aorta: The aortic root is normal in size and structure.  Venous: The inferior vena cava is normal in size with greater than 50% respiratory variability, suggesting right atrial pressure of 3 mmHg.  IAS/Shunts: No atrial level shunt detected by color flow Doppler.  LEFT VENTRICLE PLAX 2D LVIDd:         3.90 cm LVIDs:         2.70 cm LV PW:         1.20 cm LV IVS:        1.20 cm LVOT diam:     2.10 cm LV SV:         44 LV SV Index:   25 LVOT Area:     3.46 cm   RIGHT VENTRICLE             IVC RV Basal diam:  4.70 cm     IVC diam: 1.90 cm RV S prime:     11.00 cm/s TAPSE (M-mode): 1.9 cm  LEFT ATRIUM              Index        RIGHT ATRIUM           Index LA diam:        5.50 cm  3.12 cm/m   RA Area:     27.50 cm LA Vol (A2C):   171.0 ml 97.13 ml/m  RA Volume:   97.20 ml  55.21 ml/m LA Vol (A4C):   91.3 ml  51.86 ml/m LA Biplane Vol: 132.0 ml 74.97 ml/m AORTIC VALVE LVOT Vmax:   66.30 cm/s LVOT Vmean:  42.750 cm/s LVOT VTI:    0.127 m  AORTA Ao Root diam: 3.40 cm Ao Asc diam:  3.60 cm  MITRAL VALVE                TRICUSPID VALVE MV Area (PHT): 1.59 cm     TR Peak grad:   49.8 mmHg MV Area VTI:   0.91 cm     TR Vmax:        353.00 cm/s MV Peak grad:  10.9 mmHg MV Mean grad:  3.0 mmHg     SHUNTS MV Vmax:       1.65 m/s     Systemic VTI:  0.13 m MV Vmean:      77.8 cm/s    Systemic Diam: 2.10 cm MV E velocity: 163.33  cm/s  Charlton Haws MD Electronically signed by Charlton Haws MD Signature Date/Time: 06/03/2023/10:59:23 AM    Final   TEE  ECHO TEE 05/29/2022  Narrative TRANSESOPHOGEAL ECHO REPORT    Patient Name:   JOVANY DISANO Date of Exam: 05/29/2022 Medical Rec #:  [id number]       Height:       68.0 in Accession #:    [id number]      Weight:       150.8  lb Date of Birth:  1941-01-21       BSA:          1.813 m Patient Age:    81 years        BP:           147/67 mmHg Patient Gender: M               HR:           61 bpm. Exam Location:  Inpatient  Procedure: Transesophageal Echo, 3D Echo, Color Doppler and Cardiac Doppler  Indications:     Mitral Regurgitation i34.0  History:         Patient has prior history of Echocardiogram examinations, most recent 05/22/2022. CAD, Arrythmias:Atrial Fibrillation; Risk Factors:Hypertension, Diabetes and Dyslipidemia. Prior MitraClip XTW placed 05/09/21.  Mitral Valve: Mitra-Clip valve is present in the mitral position. Procedure Date: 05/29/2022.  Sonographer:     Irving Burton Senior RDCS Referring Phys:  [phone] Verlyn Dannenberg Diagnosing Phys: Lennie Odor MD  PROCEDURE: After discussion of the risks and benefits of a TEE, an informed consent was obtained from the patient. The transesophogeal probe was passed without difficulty through the esophogus of the patient. Sedation performed by different physician. The patient was monitored while under deep sedation. Image quality was excellent. The patient's vital signs; including heart rate, blood pressure, and oxygen saturation; remained stable throughout the procedure. The patient developed no complications during the procedure. TEE guided Mitraclip procedure.  IMPRESSIONS   1. TEE guided Mitraclip procedure. A previously placed XTW device was present in the A2-P2 position. Severe MR was present laterally to this device due to severe bileaflet prolapse. A NT mitraclip was placed in the A1-P1 position under TEE. Trivial MR was present after the case. The lateral orifice from the prior clip procedure was closed. The medial orifice had a MVA of 2.30 cm2 by 3D planimetry. MG 3 mmHG. No significant stenosis. No immediate complications were observed. There was systolic reversal of flow in the LUPV before the procedure and systolic dominant flow after the new clip was  placed. A small iatrogenic ASD was present from IAS puncture with L to R flow after the case. The mitral valve is myxomatous. Trivial mitral valve regurgitation. No evidence of mitral stenosis. There is a Mitra-Clip present in the mitral position. Procedure Date: 05/29/2022. 2. Left ventricular ejection fraction, by estimation, is 60 to 65%. The left ventricle has normal function. 3. Right ventricular systolic function is normal. The right ventricular size is normal. 4. Left atrial size was severely dilated. No left atrial/left atrial appendage thrombus was detected. 5. Right atrial size was mild to moderately dilated. 6. The aortic valve is tricuspid. Aortic valve regurgitation is not visualized. No aortic stenosis is present. 7. There is mild (Grade II) protruding plaque involving the aortic arch and descending aorta. 8. Evidence of atrial level shunting detected by color flow Doppler. 9. TEE guided Mitraclip procedure.  FINDINGS Left Ventricle: Left ventricular ejection fraction, by estimation, is 60  to 65%. The left ventricle has normal function. The left ventricular internal cavity size was normal in size.  Right Ventricle: The right ventricular size is normal. No increase in right ventricular wall thickness. Right ventricular systolic function is normal.  Left Atrium: Left atrial size was severely dilated. No left atrial/left atrial appendage thrombus was detected.  Right Atrium: Right atrial size was mild to moderately dilated.  Pericardium: Trivial pericardial effusion is present.  Mitral Valve: TEE guided Mitraclip procedure. A previously placed XTW device was present in the A2-P2 position. Severe MR was present laterally to this device due to severe bileaflet prolapse. A NT mitraclip was placed in the A1-P1 position under TEE. Trivial MR was present after the case. The lateral orifice from the prior clip procedure was closed. The medial orifice had a MVA of 2.30 cm2 by 3D planimetry.  MG 3 mmHG. No significant stenosis. No immediate complications were observed. There was systolic reversal of flow in the LUPV before the procedure and systolic dominant flow after the new clip was placed. A small iatrogenic ASD was present from IAS puncture with L to R flow after the case. The mitral valve is myxomatous. Trivial mitral valve regurgitation. There is a Mitra-Clip present in the mitral position. Procedure Date: 05/29/2022. No evidence of mitral valve stenosis. MV peak gradient, 6.0 mmHg. The mean mitral valve gradient is 3.0 mmHg with average heart rate of 67 bpm.  Tricuspid Valve: The tricuspid valve is grossly normal. Tricuspid valve regurgitation is mild . No evidence of tricuspid stenosis.  Aortic Valve: The aortic valve is tricuspid. Aortic valve regurgitation is not visualized. No aortic stenosis is present.  Pulmonic Valve: The pulmonic valve was grossly normal. Pulmonic valve regurgitation is mild. No evidence of pulmonic stenosis.  Aorta: The aortic root and ascending aorta are structurally normal, with no evidence of dilitation. There is mild (Grade II) protruding plaque involving the aortic arch and descending aorta.  IAS/Shunts: Evidence of atrial level shunting detected by color flow Doppler.   LEFT VENTRICLE PLAX 2D LVOT diam:     2.10 cm LV SV:         49 LV SV Index:   27 LVOT Area:     3.46 cm   AORTIC VALVE LVOT Vmax:   69.37 cm/s LVOT Vmean:  43.500 cm/s LVOT VTI:    0.141 m  AORTA Ao Root diam: 3.70 cm Ao Asc diam:  3.10 cm  MITRAL VALVE                  TRICUSPID VALVE MV Area VTI:  1.92 cm        TR Peak grad:   20.4 mmHg MV Peak grad: 6.0 mmHg        TR Vmax:        226.00 cm/s MV Mean grad: 3.0 mmHg MV Vmax:      1.22 m/s        SHUNTS MV Vmean:     69.1 cm/s       Systemic VTI:  0.14 m MR Peak grad:    145.4 mmHg   Systemic Diam: 2.10 cm MR Mean grad:    84.0 mmHg MR Vmax:         603.00 cm/s MR Vmean:        422.0 cm/s MR PISA:          3.08 cm MR PISA Eff ROA: 20 mm MR PISA Radius:  0.70 cm  Lennie Odor MD Electronically signed  by Lennie Odor MD Signature Date/Time: 05/29/2022/12:52:02 PM    Final  MONITORS  LONG TERM MONITOR (3-14 DAYS) 05/20/2022  Narrative   Continuous atrial fibrillation with HR range 37-93 bpm, average HR 60 bpm.   Qualifies as chronotropic incompetence   Patch Wear Time:  3 days and 6 hours (2023-10-13T07:57:11-398 to 2023-10-16T14:01:50-0400)  Atrial Fibrillation occurred continuously (100% burden), ranging from 37-93 bpm (avg of 60 bpm). Isolated VEs were occasional (1.7%, 4657), VE Couplets were rare (<1.0%, 456), and VE Triplets were rare (<1.0%, 2).       ______________________________________________________________________________________________      EKG:        Recent Labs: No results found for requested labs within last 365 days.  Recent Lipid Panel    Component Value Date/Time   CHOL 130 02/25/2021 0249   CHOL 86 (L) 10/10/2019 0744   TRIG 102 02/25/2021 0249   HDL 35 (L) 02/25/2021 0249   HDL 44 10/10/2019 0744   CHOLHDL 3.7 02/25/2021 0249   VLDL 20 02/25/2021 0249   LDLCALC 75 02/25/2021 0249   LDLCALC 24 10/10/2019 0744              Physical Exam:    VS:  BP 120/80   Pulse (!) 55   Ht 5\' 10"  (1.778 m)   Wt 149 lb 9.6 oz (67.9 kg)   SpO2 95%   BMI 21.47 kg/m     Wt Readings from Last 3 Encounters:  10/13/23 149 lb 9.6 oz (67.9 kg)  08/11/23 150 lb 12.8 oz (68.4 kg)  06/03/23 151 lb 9.6 oz (68.8 kg)     GEN:  Well nourished, well developed pleasant elderly male in no acute distress HEENT: Normal NECK: No JVD; No carotid bruits LYMPHATICS: No lymphadenopathy CARDIAC: Irregularly irregular, 2/6 holosystolic murmur at the apex RESPIRATORY:  Clear to auscultation without rales, wheezing or rhonchi  ABDOMEN: Soft, non-tender, non-distended MUSCULOSKELETAL:  No edema; No deformity  SKIN: Warm and dry NEUROLOGIC:  Alert and oriented x  3 PSYCHIATRIC:  Normal affect   Assessment & Plan S/P mitral valve clip implantation Patient with good reduction in mitral regurgitation after redo MitraClip procedure.  Recommend echocardiogram in December 2025 for 1 year follow-up.  Otherwise continue current management. Persistent atrial fibrillation (HCC) Well-tolerated.  Continue apixaban for anticoagulation and low-dose metoprolol succinate for rate control.  Update echo later this year as above. Essential hypertension Blood pressure is well-controlled on furosemide, isosorbide, losartan/HCTZ, and metoprolol succinate. Coronary artery disease involving coronary bypass graft of native heart without angina pectoris Patient is stable on his current medical regimen.  He is on no antiplatelet therapy because of chronic oral anticoagulation.    Medication Adjustments/Labs and Tests Ordered: Current medicines are reviewed at length with the patient today.  Concerns regarding medicines are outlined above.  Orders Placed This Encounter  Procedures   CBC   Basic metabolic panel   ECHOCARDIOGRAM COMPLETE   Meds ordered this encounter  Medications   apixaban (ELIQUIS) 5 MG TABS tablet    Sig: Take 1 tablet (5 mg total) by mouth 2 (two) times daily.    Dispense:  60 tablet    Refill:  0    Patient Instructions  Lab Work: CBC, BMET today If you have labs (blood work) drawn today and your tests are completely normal, you will receive your results only by: MyChart Message (if you have MyChart) OR A paper copy in the mail If you have any lab test that is abnormal  or we need to change your treatment, we will call you to review the results.  Testing/Procedures: ECHO (in December) Your physician has requested that you have an echocardiogram. Echocardiography is a painless test that uses sound waves to create images of your heart. It provides your doctor with information about the size and shape of your heart and how well your heart's  chambers and valves are working. This procedure takes approximately one hour. There are no restrictions for this procedure. Please do NOT wear cologne, perfume, aftershave, or lotions (deodorant is allowed). Please arrive 15 minutes prior to your appointment time.  Please note: We ask at that you not bring children with you during ultrasound (echo/ vascular) testing. Due to room size and safety concerns, children are not allowed in the ultrasound rooms during exams. Our front office staff cannot provide observation of children in our lobby area while testing is being conducted. An adult accompanying a patient to their appointment will only be allowed in the ultrasound room at the discretion of the ultrasound technician under special circumstances. We apologize for any inconvenience.  Follow-Up: At [facility], you and your health needs are our priority.  As part of our continuing mission to provide you with exceptional heart care, we have created designated Provider Care Teams.  These Care Teams include your primary Cardiologist (physician) and Advanced Practice Providers (APPs -  Physician Assistants and Nurse Practitioners) who all work together to provide you with the care you need, when you need it.  Your next appointment:   December 2025  Provider:   Tereso Newcomer, PA-C    Other Instructions   1st Floor: - Lobby - Registration  - Pharmacy  - Lab - Cafe  2nd Floor: - PV Lab - Diagnostic Testing (echo, CT, nuclear med)  3rd Floor: - Vacant  4th Floor: - TCTS (cardiothoracic surgery) - AFib Clinic - Structural Heart Clinic - Vascular Surgery  - Vascular Ultrasound  5th Floor: - HeartCare Cardiology (general and EP) - Clinical Pharmacy for coumadin, hypertension, lipid, weight-loss medications, and med management appointments    Valet parking services will be available as well.     Signed, Tonny Bollman, MD  10/13/2023 3:46 PM    [facility]

## 2023-10-13 NOTE — Patient Instructions (Signed)
 Lab Work: CBC, BMET today If you have labs (blood work) drawn today and your tests are completely normal, you will receive your results only by: MyChart Message (if you have MyChart) OR A paper copy in the mail If you have any lab test that is abnormal or we need to change your treatment, we will call you to review the results.  Testing/Procedures: ECHO (in December) Your physician has requested that you have an echocardiogram. Echocardiography is a painless test that uses sound waves to create images of your heart. It provides your doctor with information about the size and shape of your heart and how well your heart's chambers and valves are working. This procedure takes approximately one hour. There are no restrictions for this procedure. Please do NOT wear cologne, perfume, aftershave, or lotions (deodorant is allowed). Please arrive 15 minutes prior to your appointment time.  Please note: We ask at that you not bring children with you during ultrasound (echo/ vascular) testing. Due to room size and safety concerns, children are not allowed in the ultrasound rooms during exams. Our front office staff cannot provide observation of children in our lobby area while testing is being conducted. An adult accompanying a patient to their appointment will only be allowed in the ultrasound room at the discretion of the ultrasound technician under special circumstances. We apologize for any inconvenience.  Follow-Up: At [facility], you and your health needs are our priority.  As part of our continuing mission to provide you with exceptional heart care, we have created designated Provider Care Teams.  These Care Teams include your primary Cardiologist (physician) and Advanced Practice Providers (APPs -  Physician Assistants and Nurse Practitioners) who all work together to provide you with the care you need, when you need it.  Your next appointment:   December 2025  Provider:   Tereso Newcomer,  PA-C    Other Instructions   1st Floor: - Lobby - Registration  - Pharmacy  - Lab - Cafe  2nd Floor: - PV Lab - Diagnostic Testing (echo, CT, nuclear med)  3rd Floor: - Vacant  4th Floor: - TCTS (cardiothoracic surgery) - AFib Clinic - Structural Heart Clinic - Vascular Surgery  - Vascular Ultrasound  5th Floor: - HeartCare Cardiology (general and EP) - Clinical Pharmacy for coumadin, hypertension, lipid, weight-loss medications, and med management appointments    Valet parking services will be available as well.

## 2023-10-13 NOTE — Assessment & Plan Note (Signed)
 Well-tolerated.  Continue apixaban for anticoagulation and low-dose metoprolol succinate for rate control.  Update echo later this year as above.

## 2023-10-13 NOTE — Assessment & Plan Note (Signed)
 Patient with good reduction in mitral regurgitation after redo MitraClip procedure.  Recommend echocardiogram in December 2025 for 1 year follow-up.  Otherwise continue current management.

## 2023-10-13 NOTE — Assessment & Plan Note (Signed)
 Patient is stable on his current medical regimen.  He is on no antiplatelet therapy because of chronic oral anticoagulation.

## 2023-10-14 LAB — BASIC METABOLIC PANEL
BUN/Creatinine Ratio: 14 (ref 10–24)
BUN: 16 mg/dL (ref 8–27)
CO2: 27 mmol/L (ref 20–29)
Calcium: 9.2 mg/dL (ref 8.6–10.2)
Chloride: 102 mmol/L (ref 96–106)
Creatinine, Ser: 1.18 mg/dL (ref 0.76–1.27)
Glucose: 132 mg/dL — ABNORMAL HIGH (ref 70–99)
Potassium: 4.4 mmol/L (ref 3.5–5.2)
Sodium: 141 mmol/L (ref 134–144)
eGFR: 61 mL/min/{1.73_m2} (ref >59)

## 2023-10-14 LAB — CBC
Hematocrit: 41.4 % (ref 37.5–51.0)
Hemoglobin: 13.4 g/dL (ref 13.0–17.7)
MCH: 33.5 pg — ABNORMAL HIGH (ref 26.6–33.0)
MCHC: 32.4 g/dL (ref 31.5–35.7)
MCV: 104 fL — ABNORMAL HIGH (ref 79–97)
Platelets: 188 10*3/uL (ref 150–450)
RBC: 4 x10E6/uL — ABNORMAL LOW (ref 4.14–5.80)
RDW: 14 % (ref 11.6–15.4)
WBC: 4.6 10*3/uL (ref 3.4–10.8)

## 2023-10-20 ENCOUNTER — Encounter (INDEPENDENT_AMBULATORY_CARE_PROVIDER_SITE_OTHER): Payer: Medicare Other | Admitting: Ophthalmology

## 2023-10-20 DIAGNOSIS — H35033 Hypertensive retinopathy, bilateral: Secondary | ICD-10-CM

## 2023-10-20 DIAGNOSIS — H43813 Vitreous degeneration, bilateral: Secondary | ICD-10-CM | POA: Diagnosis not present

## 2023-10-20 DIAGNOSIS — I10 Essential (primary) hypertension: Secondary | ICD-10-CM

## 2023-10-20 DIAGNOSIS — H353231 Exudative age-related macular degeneration, bilateral, with active choroidal neovascularization: Secondary | ICD-10-CM | POA: Diagnosis not present

## 2023-10-21 ENCOUNTER — Encounter (INDEPENDENT_AMBULATORY_CARE_PROVIDER_SITE_OTHER): Payer: Medicare Other | Admitting: Ophthalmology

## 2023-10-21 DIAGNOSIS — H353211 Exudative age-related macular degeneration, right eye, with active choroidal neovascularization: Secondary | ICD-10-CM

## 2023-10-29 DIAGNOSIS — H401133 Primary open-angle glaucoma, bilateral, severe stage: Secondary | ICD-10-CM | POA: Diagnosis not present

## 2023-11-04 ENCOUNTER — Other Ambulatory Visit: Payer: Self-pay | Admitting: Cardiovascular Disease

## 2023-12-15 ENCOUNTER — Encounter (INDEPENDENT_AMBULATORY_CARE_PROVIDER_SITE_OTHER): Admitting: Ophthalmology

## 2023-12-15 DIAGNOSIS — H35033 Hypertensive retinopathy, bilateral: Secondary | ICD-10-CM

## 2023-12-15 DIAGNOSIS — H43813 Vitreous degeneration, bilateral: Secondary | ICD-10-CM | POA: Diagnosis not present

## 2023-12-15 DIAGNOSIS — I10 Essential (primary) hypertension: Secondary | ICD-10-CM

## 2023-12-15 DIAGNOSIS — H353231 Exudative age-related macular degeneration, bilateral, with active choroidal neovascularization: Secondary | ICD-10-CM

## 2023-12-16 ENCOUNTER — Encounter (INDEPENDENT_AMBULATORY_CARE_PROVIDER_SITE_OTHER): Admitting: Ophthalmology

## 2023-12-16 DIAGNOSIS — H353211 Exudative age-related macular degeneration, right eye, with active choroidal neovascularization: Secondary | ICD-10-CM | POA: Diagnosis not present

## 2024-01-19 ENCOUNTER — Other Ambulatory Visit: Payer: Self-pay | Admitting: Cardiovascular Disease

## 2024-01-19 DIAGNOSIS — I4819 Other persistent atrial fibrillation: Secondary | ICD-10-CM

## 2024-01-19 DIAGNOSIS — I25810 Atherosclerosis of coronary artery bypass graft(s) without angina pectoris: Secondary | ICD-10-CM

## 2024-01-19 DIAGNOSIS — I10 Essential (primary) hypertension: Secondary | ICD-10-CM

## 2024-01-19 DIAGNOSIS — Z9889 Other specified postprocedural states: Secondary | ICD-10-CM

## 2024-01-20 NOTE — Telephone Encounter (Signed)
 Prescription refill request for Eliquis  received. Indication:afib Last office visit:3/25 Scr:1.18  3/25 Age: 83 Weight:67.9  kg  Prescription refilled

## 2024-02-03 DIAGNOSIS — H401113 Primary open-angle glaucoma, right eye, severe stage: Secondary | ICD-10-CM | POA: Diagnosis not present

## 2024-02-09 ENCOUNTER — Encounter (INDEPENDENT_AMBULATORY_CARE_PROVIDER_SITE_OTHER): Admitting: Ophthalmology

## 2024-02-09 DIAGNOSIS — H43813 Vitreous degeneration, bilateral: Secondary | ICD-10-CM

## 2024-02-09 DIAGNOSIS — H35033 Hypertensive retinopathy, bilateral: Secondary | ICD-10-CM | POA: Diagnosis not present

## 2024-02-09 DIAGNOSIS — I10 Essential (primary) hypertension: Secondary | ICD-10-CM

## 2024-02-09 DIAGNOSIS — H353231 Exudative age-related macular degeneration, bilateral, with active choroidal neovascularization: Secondary | ICD-10-CM | POA: Diagnosis not present

## 2024-02-10 ENCOUNTER — Encounter (INDEPENDENT_AMBULATORY_CARE_PROVIDER_SITE_OTHER): Admitting: Ophthalmology

## 2024-02-10 DIAGNOSIS — H353211 Exudative age-related macular degeneration, right eye, with active choroidal neovascularization: Secondary | ICD-10-CM

## 2024-03-16 DIAGNOSIS — I10 Essential (primary) hypertension: Secondary | ICD-10-CM | POA: Diagnosis not present

## 2024-03-16 DIAGNOSIS — Z136 Encounter for screening for cardiovascular disorders: Secondary | ICD-10-CM | POA: Diagnosis not present

## 2024-03-16 DIAGNOSIS — E1169 Type 2 diabetes mellitus with other specified complication: Secondary | ICD-10-CM | POA: Diagnosis not present

## 2024-03-16 DIAGNOSIS — Z952 Presence of prosthetic heart valve: Secondary | ICD-10-CM | POA: Diagnosis not present

## 2024-03-16 DIAGNOSIS — I25119 Atherosclerotic heart disease of native coronary artery with unspecified angina pectoris: Secondary | ICD-10-CM | POA: Diagnosis not present

## 2024-03-16 DIAGNOSIS — I5032 Chronic diastolic (congestive) heart failure: Secondary | ICD-10-CM | POA: Diagnosis not present

## 2024-03-16 DIAGNOSIS — Z0000 Encounter for general adult medical examination without abnormal findings: Secondary | ICD-10-CM | POA: Diagnosis not present

## 2024-03-16 DIAGNOSIS — H401113 Primary open-angle glaucoma, right eye, severe stage: Secondary | ICD-10-CM | POA: Diagnosis not present

## 2024-03-16 DIAGNOSIS — Z1331 Encounter for screening for depression: Secondary | ICD-10-CM | POA: Diagnosis not present

## 2024-04-05 ENCOUNTER — Encounter (INDEPENDENT_AMBULATORY_CARE_PROVIDER_SITE_OTHER): Admitting: Ophthalmology

## 2024-04-05 DIAGNOSIS — H353231 Exudative age-related macular degeneration, bilateral, with active choroidal neovascularization: Secondary | ICD-10-CM

## 2024-04-05 DIAGNOSIS — I10 Essential (primary) hypertension: Secondary | ICD-10-CM

## 2024-04-05 DIAGNOSIS — H35033 Hypertensive retinopathy, bilateral: Secondary | ICD-10-CM

## 2024-04-05 DIAGNOSIS — H43813 Vitreous degeneration, bilateral: Secondary | ICD-10-CM | POA: Diagnosis not present

## 2024-04-06 ENCOUNTER — Encounter (INDEPENDENT_AMBULATORY_CARE_PROVIDER_SITE_OTHER): Admitting: Ophthalmology

## 2024-05-04 DIAGNOSIS — H401133 Primary open-angle glaucoma, bilateral, severe stage: Secondary | ICD-10-CM | POA: Diagnosis not present

## 2024-05-31 ENCOUNTER — Encounter (INDEPENDENT_AMBULATORY_CARE_PROVIDER_SITE_OTHER): Admitting: Ophthalmology

## 2024-05-31 DIAGNOSIS — H43813 Vitreous degeneration, bilateral: Secondary | ICD-10-CM

## 2024-05-31 DIAGNOSIS — H35033 Hypertensive retinopathy, bilateral: Secondary | ICD-10-CM | POA: Diagnosis not present

## 2024-05-31 DIAGNOSIS — I10 Essential (primary) hypertension: Secondary | ICD-10-CM

## 2024-05-31 DIAGNOSIS — H353231 Exudative age-related macular degeneration, bilateral, with active choroidal neovascularization: Secondary | ICD-10-CM | POA: Diagnosis not present

## 2024-07-06 ENCOUNTER — Encounter: Payer: Self-pay | Admitting: Cardiovascular Disease

## 2024-07-06 ENCOUNTER — Ambulatory Visit: Admitting: Cardiovascular Disease

## 2024-07-06 ENCOUNTER — Ambulatory Visit (HOSPITAL_COMMUNITY)
Admission: RE | Admit: 2024-07-06 | Discharge: 2024-07-06 | Disposition: A | Discharge status: Home / Self Care | Source: Ambulatory Visit | Attending: Cardiovascular Disease | Admitting: Cardiovascular Disease

## 2024-07-06 VITALS — BP 144/64 | HR 53 | Resp 17 | Ht 68.0 in | Wt 153.0 lb

## 2024-07-06 DIAGNOSIS — I25810 Atherosclerosis of coronary artery bypass graft(s) without angina pectoris: Secondary | ICD-10-CM | POA: Diagnosis present

## 2024-07-06 DIAGNOSIS — Z9889 Other specified postprocedural states: Secondary | ICD-10-CM

## 2024-07-06 DIAGNOSIS — I5032 Chronic diastolic (congestive) heart failure: Secondary | ICD-10-CM | POA: Insufficient documentation

## 2024-07-06 DIAGNOSIS — Z95818 Presence of other cardiac implants and grafts: Secondary | ICD-10-CM | POA: Insufficient documentation

## 2024-07-06 DIAGNOSIS — I4819 Other persistent atrial fibrillation: Secondary | ICD-10-CM | POA: Insufficient documentation

## 2024-07-06 DIAGNOSIS — I10 Essential (primary) hypertension: Secondary | ICD-10-CM | POA: Insufficient documentation

## 2024-07-06 LAB — ECHOCARDIOGRAM COMPLETE
Area-P 1/2: 2.44 cm2
MV VTI: 1.35 cm2
S' Lateral: 2.93 cm

## 2024-07-06 NOTE — Patient Instructions (Signed)
 Medication Instructions:  Your physician recommends that you continue on your current medications as directed. Please refer to the Current Medication list given to you today.  *If you need a refill on your cardiac medications before your next appointment, please call your pharmacy*  Lab Work: None ordered.  If you have labs (blood work) drawn today and your tests are completely normal, you will receive your results only by: MyChart Message (if you have MyChart) OR A paper copy in the mail If you have any lab test that is abnormal or we need to change your treatment, we will call you to review the results.  Testing/Procedures: None ordered.   Follow-Up: At [facility], you and your health needs are our priority.  As part of our continuing mission to provide you with exceptional heart care, our providers are all part of one team.  This team includes your primary Cardiologist (physician) and Advanced Practice Providers or APPs (Physician Assistants and Nurse Practitioners) who all work together to provide you with the care you need, when you need it.  Your next appointment:   6 months with Dr Margurite APP  12 months with Dr Wonda and echo prior to that appointment.

## 2024-07-06 NOTE — Progress Notes (Signed)
 Cardiology Office Note:    Date:  07/07/2024   ID:  Micheal [name], DOB 04/24/1941, MRN [id number]  PCP:  Micheal Charm, MD   [facility] Providers Cardiologist:  Micheal Fell, MD     Referring MD: Micheal Charm, MD   Chief Complaint  Patient presents with   S/P mitral valve clip implantation   Follow-up    1 year    History of Present Illness:    Micheal [name] is a 83 y.o. male with a hx of  CAD s/p CABG 2007, DM2, persistent atrial fibrillation on Eliquis , chronic CHF, PAD, and severe mitral regurgitation s/p TEER 04/2021 who developed worsening SOB and fatigue the following year and was found to have severe residual MR due to persistent bileaflet prolapse. He presented to [facility] for repeat Mitral valve edge to edge repair 05/29/22, undergoing implant with a MitraClip NT device positioned A1P1, reducing MR from severe to trivial post-procedure.   The patient is here alone today. States that he is doing well from a cardiac perspective and has no complaints of chest pain, shortness of breath, or palpitations. No edema, orthopnea, or PND. He is 'shooting his age' on the golf course. He has struggled with vision loss due to macular degeneration and states that he has good days and bad days with that. Working with his ophthalmologist closely.    Current Medications: Current Meds  Medication Sig   apixaban  (ELIQUIS ) 5 MG TABS tablet TAKE 1 TABLET BY MOUTH TWICE A DAY   Cholecalciferol (VITAMIN D) 50 MCG (2000 UT) tablet Take 2,000 Units by mouth in the morning and at bedtime.   CINNAMON PO Take 1,000 mg by mouth daily.   Coenzyme Q10 (CO Q-10) 200 MG CAPS Take 200 mg by mouth daily.    dorzolamide-timolol (COSOPT) 22.3-6.8 MG/ML ophthalmic solution Place 1 drop into both eyes 2 (two) times daily.   furosemide  (LASIX ) 20 MG tablet TAKE 1 TABLET BY MOUTH EVERY DAY   Glucosamine-Chondroitin (GLUCOSAMINE CHONDR COMPLEX PO) Take 1 tablet by mouth daily.    isosorbide  mononitrate (IMDUR ) 30 MG 24 hr tablet Take 1 tablet (30 mg total) by mouth daily.   levOCARNitine (L-CARNITINE) 500 MG TABS Take 500 mg by mouth in the morning.   losartan -hydrochlorothiazide  (HYZAAR) 50-12.5 MG tablet Take 1 tablet by mouth daily.   Magnesium  400 MG CAPS    metoprolol  succinate (TOPROL -XL) 25 MG 24 hr tablet TAKE 1 TABLET (25 MG TOTAL) BY MOUTH DAILY.   Misc Natural Products (NF FORMULAS TESTOSTERONE) CAPS Take 1 capsule by mouth in the morning.   Multiple Vitamins-Minerals (PRESERVISION AREDS 2 PO) Take 1 capsule by mouth 2 (two) times daily.   Omega-3 Fatty Acids (FISH OIL PO) Take 1 capsule by mouth as needed.   potassium chloride  SA (KLOR-CON  M20) 20 MEQ tablet Take 1 tablet (20 mEq total) by mouth daily.   pravastatin  (PRAVACHOL ) 20 MG tablet Take 20 mg by mouth at bedtime.   QUERCETIN PO Take 1 tablet by mouth daily.   vitamin B-12 (CYANOCOBALAMIN ) 1000 MCG tablet Take 1,000 mcg by mouth daily.   vitamin E 400 UNIT capsule Take 400 Units by mouth daily.   VYZULTA 0.024 % SOLN Place 1 drop into the right eye at bedtime.     Allergies:   Patient has no known allergies.   ROS:   Please see the history of present illness.    All other systems reviewed and are negative.  EKGs/Labs/Other Studies Reviewed:  The following studies were reviewed today: Cardiac Studies & Procedures   ______________________________________________________________________________________________ CARDIAC CATHETERIZATION  CARDIAC CATHETERIZATION 05/29/2022  Conclusion Successful transcatheter edge-to-edge repair of the mitral valve using a single MitraClip NT device positioned A1/P1, reducing mitral regurgitation from 4+ at baseline to trace post procedure.   CARDIAC CATHETERIZATION  CARDIAC CATHETERIZATION 05/09/2021  Conclusion Successful transcatheter mitral edge to edge repair with one XTW clip positioned on A2/P2 reducing severe degenerative nonrheumatic mitral  regurgitation to mild to moderate mitral regurgitation.  RECOMMENDATIONS:  Admit for observation, restart home anticoagulation, and obtain limited echocardiogram tomorrow.     ECHOCARDIOGRAM  ECHOCARDIOGRAM COMPLETE 07/06/2024  Narrative ECHOCARDIOGRAM REPORT    Patient Name:   Micheal [name] Date of Exam: 07/06/2024 Medical Rec #:  [id number]       Height:       70.0 in Accession #:    [id number]      Weight:       149.6 lb Date of Birth:  [birth year]       BSA:          1.845 m Patient Age:    83 years        BP:           141/55 mmHg Patient Gender: M               HR:           51 bpm. Exam Location:  [address]  Procedure: 2D Echo (Both Spectral and Color Flow Doppler were utilized during procedure).  Indications:    I48.1 Persistent atrial fibrillation  History:        Patient has prior history of Echocardiogram examinations, most recent 06/03/2023. CHF, CAD, Prior CABG, PAD; Risk Factors:Hypertension, Diabetes and Dyslipidemia. S/P mitral valve clip implantation.  Mitral Valve: Mitra-Clip valve is present in the mitral position. Procedure Date: 05/2022.  Sonographer:    Jon Hacker RCS Referring Phys: [phone] [name]  IMPRESSIONS   1. Left ventricular ejection fraction, by estimation, is 55 to 60%. The left ventricle has normal function. The left ventricle has no regional wall motion abnormalities. Left ventricular diastolic function could not be evaluated. 2. Right ventricular systolic function is mildly reduced. The right ventricular size is mildly enlarged. There is severely elevated pulmonary artery systolic pressure. The estimated right ventricular systolic pressure is 70.4 mmHg. 3. Left atrial size was severely dilated. 4. Right atrial size was severely dilated. 5. The mitral valve has been repaired/replaced. Trivial mitral valve regurgitation. No evidence of mitral stenosis. The mean mitral valve gradient is 2.4 mmHg. There is a Mitra-Clip present  in the mitral position. Procedure Date: 05/2022. Echo findings are consistent with normal structure and function of the mitral valve prosthesis. 6. The tricuspid valve is degenerative. Tricuspid valve regurgitation is moderate to severe. 7. The aortic valve is normal in structure. There is mild calcification of the aortic valve. Aortic valve regurgitation is not visualized. Aortic valve sclerosis/calcification is present, without any evidence of aortic stenosis. 8. The inferior vena cava is dilated in size with >50% respiratory variability, suggesting right atrial pressure of 8 mmHg.  Comparison(s): Changes from prior study are noted. Increase in PA pressures.  FINDINGS Left Ventricle: Left ventricular ejection fraction, by estimation, is 55 to 60%. The left ventricle has normal function. The left ventricle has no regional wall motion abnormalities. The left ventricular internal cavity size was normal in size. There is no left ventricular hypertrophy. Abnormal (paradoxical) septal motion consistent with post-operative  status. Left ventricular diastolic function could not be evaluated due to atrial fibrillation. Left ventricular diastolic function could not be evaluated.  Right Ventricle: The right ventricular size is mildly enlarged. No increase in right ventricular wall thickness. Right ventricular systolic function is mildly reduced. There is severely elevated pulmonary artery systolic pressure. The tricuspid regurgitant velocity is 3.95 m/s, and with an assumed right atrial pressure of 8 mmHg, the estimated right ventricular systolic pressure is 70.4 mmHg.  Left Atrium: Left atrial size was severely dilated.  Right Atrium: Right atrial size was severely dilated.  Pericardium: There is no evidence of pericardial effusion.  Mitral Valve: The mitral valve has been repaired/replaced. Trivial mitral valve regurgitation. There is a Mitra-Clip present in the mitral position. Procedure Date:  05/2022. Echo findings are consistent with normal structure and function of the mitral valve prosthesis. No evidence of mitral valve stenosis. MV peak gradient, 9.7 mmHg. The mean mitral valve gradient is 2.4 mmHg.  Tricuspid Valve: The tricuspid valve is degenerative in appearance. Tricuspid valve regurgitation is moderate to severe. No evidence of tricuspid stenosis.  Aortic Valve: The aortic valve is normal in structure. There is mild calcification of the aortic valve. Aortic valve regurgitation is not visualized. Aortic valve sclerosis/calcification is present, without any evidence of aortic stenosis.  Pulmonic Valve: The pulmonic valve was normal in structure. Pulmonic valve regurgitation is mild. No evidence of pulmonic stenosis.  Aorta: The aortic root is normal in size and structure.  Venous: The inferior vena cava is dilated in size with greater than 50% respiratory variability, suggesting right atrial pressure of 8 mmHg.  IAS/Shunts: No atrial level shunt detected by color flow Doppler.   LEFT VENTRICLE PLAX 2D LVIDd:         4.60 cm LVIDs:         2.93 cm LV PW:         0.99 cm LV IVS:        1.09 cm LVOT diam:     2.00 cm LV SV:         52 LV SV Index:   28 LVOT Area:     3.14 cm   RIGHT VENTRICLE RV Basal diam:  4.04 cm RV S prime:     10.20 cm/s TAPSE (M-mode): 0.9 cm RVSP:           65.4 mmHg  LEFT ATRIUM              Index        RIGHT ATRIUM           Index LA diam:        5.00 cm  2.71 cm/m   RA Pressure: 3.00 mmHg LA Vol (A2C):   116.0 ml 62.87 ml/m  RA Area:     22.70 cm LA Vol (A4C):   116.0 ml 62.87 ml/m  RA Volume:   65.80 ml  35.66 ml/m LA Biplane Vol: 119.0 ml 64.50 ml/m AORTIC VALVE LVOT Vmax:   78.33 cm/s LVOT Vmean:  50.300 cm/s LVOT VTI:    0.165 m  AORTA Ao Root diam: 3.20 cm Ao Asc diam:  3.80 cm  MITRAL VALVE              TRICUSPID VALVE MV Area (PHT): 2.44 cm   TR Peak grad:   62.4 mmHg MV Area VTI:   1.35 cm   TR Vmax:         395.00 cm/s MV Peak grad:  9.7 mmHg  Estimated RAP:  3.00 mmHg MV Mean grad:  2.4 mmHg   RVSP:           65.4 mmHg MV Vmax:       1.55 m/s MV Vmean:      69.3 cm/s  SHUNTS Systemic VTI:  0.16 m Systemic Diam: 2.00 cm  Morene Brownie Electronically signed by Morene Brownie Signature Date/Time: 07/06/2024/9:38:32 AM    Final   TEE  ECHO TEE 05/29/2022  Narrative TRANSESOPHOGEAL ECHO REPORT    Patient Name:   Micheal [name] Date of Exam: 05/29/2022 Medical Rec #:  [id number]       Height:       68.0 in Accession #:    [id number]      Weight:       150.8 lb Date of Birth:  May 13, 1941       BSA:          1.813 m Patient Age:    81 years        BP:           147/67 mmHg Patient Gender: M               HR:           61 bpm. Exam Location:  Inpatient  Procedure: Transesophageal Echo, 3D Echo, Color Doppler and Cardiac Doppler  Indications:     Mitral Regurgitation i34.0  History:         Patient has prior history of Echocardiogram examinations, most recent 05/22/2022. CAD, Arrythmias:Atrial Fibrillation; Risk Factors:Hypertension, Diabetes and Dyslipidemia. Prior MitraClip XTW placed 05/09/21.  Mitral Valve: Mitra-Clip valve is present in the mitral position. Procedure Date: 05/29/2022.  Sonographer:     Damien Senior RDCS Referring Phys:  [phone] Adalei Novell Diagnosing Phys: Darryle Decent MD  PROCEDURE: After discussion of the risks and benefits of a TEE, an informed consent was obtained from the patient. The transesophogeal probe was passed without difficulty through the esophogus of the patient. Sedation performed by different physician. The patient was monitored while under deep sedation. Image quality was excellent. The patient's vital signs; including heart rate, blood pressure, and oxygen saturation; remained stable throughout the procedure. The patient developed no complications during the procedure. TEE guided Mitraclip procedure.  IMPRESSIONS   1. TEE guided  Mitraclip procedure. A previously placed XTW device was present in the A2-P2 position. Severe MR was present laterally to this device due to severe bileaflet prolapse. A NT mitraclip was placed in the A1-P1 position under TEE. Trivial MR was present after the case. The lateral orifice from the prior clip procedure was closed. The medial orifice had a MVA of 2.30 cm2 by 3D planimetry. MG 3 mmHG. No significant stenosis. No immediate complications were observed. There was systolic reversal of flow in the LUPV before the procedure and systolic dominant flow after the new clip was placed. A small iatrogenic ASD was present from IAS puncture with L to R flow after the case. The mitral valve is myxomatous. Trivial mitral valve regurgitation. No evidence of mitral stenosis. There is a Mitra-Clip present in the mitral position. Procedure Date: 05/29/2022. 2. Left ventricular ejection fraction, by estimation, is 60 to 65%. The left ventricle has normal function. 3. Right ventricular systolic function is normal. The right ventricular size is normal. 4. Left atrial size was severely dilated. No left atrial/left atrial appendage thrombus was detected. 5. Right atrial size was mild to moderately dilated. 6. The aortic valve is tricuspid. Aortic valve  regurgitation is not visualized. No aortic stenosis is present. 7. There is mild (Grade II) protruding plaque involving the aortic arch and descending aorta. 8. Evidence of atrial level shunting detected by color flow Doppler. 9. TEE guided Mitraclip procedure.  FINDINGS Left Ventricle: Left ventricular ejection fraction, by estimation, is 60 to 65%. The left ventricle has normal function. The left ventricular internal cavity size was normal in size.  Right Ventricle: The right ventricular size is normal. No increase in right ventricular wall thickness. Right ventricular systolic function is normal.  Left Atrium: Left atrial size was severely dilated. No left  atrial/left atrial appendage thrombus was detected.  Right Atrium: Right atrial size was mild to moderately dilated.  Pericardium: Trivial pericardial effusion is present.  Mitral Valve: TEE guided Mitraclip procedure. A previously placed XTW device was present in the A2-P2 position. Severe MR was present laterally to this device due to severe bileaflet prolapse. A NT mitraclip was placed in the A1-P1 position under TEE. Trivial MR was present after the case. The lateral orifice from the prior clip procedure was closed. The medial orifice had a MVA of 2.30 cm2 by 3D planimetry. MG 3 mmHG. No significant stenosis. No immediate complications were observed. There was systolic reversal of flow in the LUPV before the procedure and systolic dominant flow after the new clip was placed. A small iatrogenic ASD was present from IAS puncture with L to R flow after the case. The mitral valve is myxomatous. Trivial mitral valve regurgitation. There is a Mitra-Clip present in the mitral position. Procedure Date: 05/29/2022. No evidence of mitral valve stenosis. MV peak gradient, 6.0 mmHg. The mean mitral valve gradient is 3.0 mmHg with average heart rate of 67 bpm.  Tricuspid Valve: The tricuspid valve is grossly normal. Tricuspid valve regurgitation is mild . No evidence of tricuspid stenosis.  Aortic Valve: The aortic valve is tricuspid. Aortic valve regurgitation is not visualized. No aortic stenosis is present.  Pulmonic Valve: The pulmonic valve was grossly normal. Pulmonic valve regurgitation is mild. No evidence of pulmonic stenosis.  Aorta: The aortic root and ascending aorta are structurally normal, with no evidence of dilitation. There is mild (Grade II) protruding plaque involving the aortic arch and descending aorta.  IAS/Shunts: Evidence of atrial level shunting detected by color flow Doppler.   LEFT VENTRICLE PLAX 2D LVOT diam:     2.10 cm LV SV:         49 LV SV Index:   27 LVOT Area:      3.46 cm   AORTIC VALVE LVOT Vmax:   69.37 cm/s LVOT Vmean:  43.500 cm/s LVOT VTI:    0.141 m  AORTA Ao Root diam: 3.70 cm Ao Asc diam:  3.10 cm  MITRAL VALVE                  TRICUSPID VALVE MV Area VTI:  1.92 cm        TR Peak grad:   20.4 mmHg MV Peak grad: 6.0 mmHg        TR Vmax:        226.00 cm/s MV Mean grad: 3.0 mmHg MV Vmax:      1.22 m/s        SHUNTS MV Vmean:     69.1 cm/s       Systemic VTI:  0.14 m MR Peak grad:    145.4 mmHg   Systemic Diam: 2.10 cm MR Mean grad:    84.0 mmHg MR  Vmax:         603.00 cm/s MR Vmean:        422.0 cm/s MR PISA:         3.08 cm MR PISA Eff ROA: 20 mm MR PISA Radius:  0.70 cm  Darryle Decent MD Electronically signed by Darryle Decent MD Signature Date/Time: 05/29/2022/12:52:02 PM    Final  MONITORS  LONG TERM MONITOR (3-14 DAYS) 05/20/2022  Narrative   Continuous atrial fibrillation with HR range 37-93 bpm, average HR 60 bpm.   Qualifies as chronotropic incompetence   Patch Wear Time:  3 days and 6 hours (2023-10-13T07:57:11-398 to 2023-10-16T14:01:50-0400)  Atrial Fibrillation occurred continuously (100% burden), ranging from 37-93 bpm (avg of 60 bpm). Isolated VEs were occasional (1.7%, 4657), VE Couplets were rare (<1.0%, 456), and VE Triplets were rare (<1.0%, 2).       ______________________________________________________________________________________________      EKG:   EKG Interpretation Date/Time:  Wednesday July 06 2024 09:33:21 EST Ventricular Rate:  53 PR Interval:    QRS Duration:  90 QT Interval:  468 QTC Calculation: 439 R Axis:   88  Text Interpretation: Atrial fibrillation with slow ventricular response Nonspecific ST and T wave abnormality When compared with ECG of 30-May-2022 06:56, Right bundle branch block is no longer Present Confirmed by Wonda Sharper [phone]) on 07/06/2024 9:41:16 AM    Recent Labs: 10/13/2023: BUN 16; Creatinine, Ser 1.18; Hemoglobin 13.4; Platelets 188;  Potassium 4.4; Sodium 141  Recent Lipid Panel    Component Value Date/Time   CHOL 130 02/25/2021 0249   CHOL 86 (L) 10/10/2019 0744   TRIG 102 02/25/2021 0249   HDL 35 (L) 02/25/2021 0249   HDL 44 10/10/2019 0744   CHOLHDL 3.7 02/25/2021 0249   VLDL 20 02/25/2021 0249   LDLCALC 75 02/25/2021 0249   LDLCALC 24 10/10/2019 0744               Physical Exam:    VS:  BP (!) 144/64 (BP Location: Left Arm, Patient Position: Sitting, Cuff Size: Normal)   Pulse (!) 53   Resp 17   Ht 5' 8 (1.727 m)   Wt 153 lb (69.4 kg)   SpO2 96%   BMI 23.26 kg/m     Wt Readings from Last 3 Encounters:  07/06/24 153 lb (69.4 kg)  10/13/23 149 lb 9.6 oz (67.9 kg)  08/11/23 150 lb 12.8 oz (68.4 kg)     GEN:  Well nourished, well developed in no acute distress HEENT: Normal NECK: No JVD; No carotid bruits LYMPHATICS: No lymphadenopathy CARDIAC: irregular, 2/6 holosystolic murmur at the apex RESPIRATORY:  Clear to auscultation without rales, wheezing or rhonchi  ABDOMEN: Soft, non-tender, non-distended MUSCULOSKELETAL:  No edema; No deformity  SKIN: Warm and dry NEUROLOGIC:  Alert and oriented x 3 PSYCHIATRIC:  Normal affect   Assessment & Plan S/P mitral valve clip implantation Reviewed today's echo. Only mild residual MR. Normal LVEF. Moderate-severe TR stable. Pt asymptomatic. Continue current Rx.  Persistent atrial fibrillation (HCC) Continue metoprolol  succinate for rate control and apixaban  for oral anticoagulation.  Essential hypertension BP well-controlled on losartan /HCT/metoprolol  Coronary artery disease involving coronary bypass graft of native heart without angina pectoris No angina. No antiplatelet Rx in setting chronic OAC. Continue pravastatin . Chronic diastolic heart failure (HCC) Euvolemic on low dose furosemide , 20 mg daily. No changes made. Echo reviewed from this am. Reviewed images with patient.             Medication Adjustments/Labs and  Tests  Ordered: Current medicines are reviewed at length with the patient today.  Concerns regarding medicines are outlined above.  Orders Placed This Encounter  Procedures   EKG 12-Lead   ECHOCARDIOGRAM COMPLETE   No orders of the defined types were placed in this encounter.   Patient Instructions  Medication Instructions:  Your physician recommends that you continue on your current medications as directed. Please refer to the Current Medication list given to you today.  *If you need a refill on your cardiac medications before your next appointment, please call your pharmacy*  Lab Work: None ordered.  If you have labs (blood work) drawn today and your tests are completely normal, you will receive your results only by: MyChart Message (if you have MyChart) OR A paper copy in the mail If you have any lab test that is abnormal or we need to change your treatment, we will call you to review the results.  Testing/Procedures: None ordered.   Follow-Up: At [facility], you and your health needs are our priority.  As part of our continuing mission to provide you with exceptional heart care, our providers are all part of one team.  This team includes your primary Cardiologist (physician) and Advanced Practice Providers or APPs (Physician Assistants and Nurse Practitioners) who all work together to provide you with the care you need, when you need it.  Your next appointment:   6 months with Dr Margurite APP  12 months with Dr Wonda and echo prior to that appointment.           Signed, Micheal Wonda, MD  07/07/2024 6:01 AM    [facility]

## 2024-07-07 ENCOUNTER — Encounter: Payer: Self-pay | Admitting: Cardiovascular Disease

## 2024-07-07 NOTE — Assessment & Plan Note (Addendum)
 Continue metoprolol  succinate for rate control and apixaban  for oral anticoagulation.

## 2024-07-07 NOTE — Assessment & Plan Note (Addendum)
 No angina. No antiplatelet Rx in setting chronic OAC. Continue pravastatin .

## 2024-07-07 NOTE — Assessment & Plan Note (Addendum)
 Reviewed today's echo. Only mild residual MR. Normal LVEF. Moderate-severe TR stable. Pt asymptomatic. Continue current Rx.

## 2024-08-02 ENCOUNTER — Encounter (INDEPENDENT_AMBULATORY_CARE_PROVIDER_SITE_OTHER): Admitting: Ophthalmology

## 2024-08-02 DIAGNOSIS — H353231 Exudative age-related macular degeneration, bilateral, with active choroidal neovascularization: Secondary | ICD-10-CM

## 2024-08-02 DIAGNOSIS — H43813 Vitreous degeneration, bilateral: Secondary | ICD-10-CM

## 2024-08-02 DIAGNOSIS — I10 Essential (primary) hypertension: Secondary | ICD-10-CM | POA: Diagnosis not present

## 2024-08-02 DIAGNOSIS — H35033 Hypertensive retinopathy, bilateral: Secondary | ICD-10-CM | POA: Diagnosis not present

## 2024-08-03 ENCOUNTER — Encounter: Payer: Self-pay | Admitting: Cardiovascular Disease

## 2024-10-04 ENCOUNTER — Encounter (INDEPENDENT_AMBULATORY_CARE_PROVIDER_SITE_OTHER): Admitting: Ophthalmology
